# Patient Record
Sex: Female | Born: 1965 | Race: White | Hispanic: No | Marital: Married | Smoking: Never smoker
Health system: Southern US, Community
[De-identification: ages and names within clinical notes are randomized; demographics above are authoritative.]

## PROBLEM LIST (undated history)

## (undated) DIAGNOSIS — E559 Vitamin D deficiency, unspecified: Secondary | ICD-10-CM

## (undated) DIAGNOSIS — L309 Dermatitis, unspecified: Secondary | ICD-10-CM

## (undated) DIAGNOSIS — F32A Depression, unspecified: Secondary | ICD-10-CM

## (undated) DIAGNOSIS — K5090 Crohn's disease, unspecified, without complications: Secondary | ICD-10-CM

## (undated) HISTORY — DX: Vitamin D deficiency, unspecified: E55.9

## (undated) HISTORY — DX: Crohn's disease, unspecified, without complications: K50.90

## (undated) HISTORY — DX: Dermatitis, unspecified: L30.9

## (undated) HISTORY — PX: WISDOM TOOTH EXTRACTION: SHX21

## (undated) HISTORY — PX: COLONOSCOPY: SHX174

## (undated) HISTORY — DX: Depression, unspecified: F32.A

---

## 1999-07-03 ENCOUNTER — Other Ambulatory Visit: Admission: RE | Admit: 1999-07-03 | Discharge: 1999-07-03 | Payer: Self-pay | Admitting: *Deleted

## 2000-08-25 ENCOUNTER — Other Ambulatory Visit: Admission: RE | Admit: 2000-08-25 | Discharge: 2000-08-25 | Payer: Self-pay | Admitting: *Deleted

## 2001-08-26 ENCOUNTER — Other Ambulatory Visit: Admission: RE | Admit: 2001-08-26 | Discharge: 2001-08-26 | Payer: Self-pay | Admitting: Obstetrics and Gynecology

## 2002-08-28 ENCOUNTER — Other Ambulatory Visit: Admission: RE | Admit: 2002-08-28 | Discharge: 2002-08-28 | Payer: Self-pay | Admitting: Obstetrics and Gynecology

## 2003-09-26 ENCOUNTER — Other Ambulatory Visit: Admission: RE | Admit: 2003-09-26 | Discharge: 2003-09-26 | Payer: Self-pay | Admitting: Obstetrics and Gynecology

## 2003-10-09 ENCOUNTER — Ambulatory Visit (HOSPITAL_COMMUNITY): Admission: RE | Admit: 2003-10-09 | Discharge: 2003-10-09 | Payer: Self-pay | Admitting: Internal Medicine

## 2004-12-08 ENCOUNTER — Ambulatory Visit (HOSPITAL_COMMUNITY): Admission: RE | Admit: 2004-12-08 | Discharge: 2004-12-08 | Payer: Self-pay | Admitting: Internal Medicine

## 2005-03-22 ENCOUNTER — Inpatient Hospital Stay (HOSPITAL_COMMUNITY): Admission: AD | Admit: 2005-03-22 | Discharge: 2005-03-22 | Payer: Self-pay | Admitting: *Deleted

## 2005-03-22 ENCOUNTER — Encounter (INDEPENDENT_AMBULATORY_CARE_PROVIDER_SITE_OTHER): Payer: Self-pay | Admitting: *Deleted

## 2006-07-13 ENCOUNTER — Inpatient Hospital Stay (HOSPITAL_COMMUNITY): Admission: AD | Admit: 2006-07-13 | Discharge: 2006-07-16 | Payer: Self-pay | Admitting: Obstetrics and Gynecology

## 2007-02-10 ENCOUNTER — Encounter: Admission: RE | Admit: 2007-02-10 | Discharge: 2007-02-10 | Payer: Self-pay | Admitting: Gastroenterology

## 2008-11-27 ENCOUNTER — Ambulatory Visit (HOSPITAL_COMMUNITY): Admission: RE | Admit: 2008-11-27 | Discharge: 2008-11-27 | Payer: Self-pay | Admitting: Obstetrics

## 2008-12-02 ENCOUNTER — Inpatient Hospital Stay (HOSPITAL_COMMUNITY): Admission: AD | Admit: 2008-12-02 | Discharge: 2008-12-02 | Payer: Self-pay | Admitting: Obstetrics

## 2008-12-02 ENCOUNTER — Ambulatory Visit: Payer: Self-pay | Admitting: Obstetrics and Gynecology

## 2009-06-21 ENCOUNTER — Encounter: Admission: RE | Admit: 2009-06-21 | Discharge: 2009-06-21 | Payer: Self-pay | Admitting: Family Medicine

## 2010-10-26 ENCOUNTER — Inpatient Hospital Stay (HOSPITAL_COMMUNITY)
Admission: AD | Admit: 2010-10-26 | Discharge: 2010-10-28 | DRG: 372 | Disposition: A | Discharge status: Home / Self Care | Payer: BC Managed Care – PPO | Source: Ambulatory Visit | Attending: Obstetrics | Admitting: Obstetrics

## 2010-10-26 DIAGNOSIS — O4590 Premature separation of placenta, unspecified, unspecified trimester: Secondary | ICD-10-CM | POA: Diagnosis present

## 2010-10-26 DIAGNOSIS — O09529 Supervision of elderly multigravida, unspecified trimester: Secondary | ICD-10-CM | POA: Diagnosis present

## 2010-10-26 LAB — CBC
Hemoglobin: 12.9 g/dL (ref 12.0–15.0)
RBC: 4.07 MIL/uL (ref 3.87–5.11)
WBC: 11.5 10*3/uL — ABNORMAL HIGH (ref 4.0–10.5)

## 2010-10-27 ENCOUNTER — Other Ambulatory Visit: Payer: Self-pay | Admitting: Obstetrics & Gynecology

## 2010-10-27 LAB — RPR: RPR Ser Ql: NONREACTIVE

## 2010-10-28 LAB — CBC
HCT: 31.2 % — ABNORMAL LOW (ref 36.0–46.0)
Hemoglobin: 10.5 g/dL — ABNORMAL LOW (ref 12.0–15.0)
RBC: 3.34 MIL/uL — ABNORMAL LOW (ref 3.87–5.11)
WBC: 11 10*3/uL — ABNORMAL HIGH (ref 4.0–10.5)

## 2011-01-13 ENCOUNTER — Inpatient Hospital Stay (HOSPITAL_COMMUNITY): Admission: AD | Admit: 2011-01-13 | Payer: Self-pay | Source: Home / Self Care | Admitting: Obstetrics and Gynecology

## 2011-01-30 NOTE — H&P (Signed)
Jacqueline Banks, Jacqueline Banks             ACCOUNT NO.:  [id number]   MEDICAL RECORD NO.:  [id number]          PATIENT TYPE:  INP   LOCATION:  9170                          FACILITY:  [facility]   PHYSICIAN:  Richardean Sale, M.D.   DATE OF BIRTH:  [birth year]   DATE OF ADMISSION:  07/13/2006  DATE OF DISCHARGE:                                HISTORY & PHYSICAL   ADMISSION DIAGNOSIS:  A 39-week intrauterine pregnancy with nausea and  suspected HELLP syndrome.   HISTORY OF PRESENT ILLNESS:  This is a 40-year gravida 4, para 0-0-3-0 white  female who is at 39+ weeks gestation with a due date of July 17, 2006 by  first trimester ultrasound who presented to the office today complaining of  nausea.  She denies any headache or visual changes.  She reports good fetal  movement, complains of occasional contractions and denies the loss of fluid  or vaginal bleeding.  Upon her evaluation in the office, the patient had a  NST performed which showed a heart rate in the 160s.  The patient did have a  deceleration down to the 100s and was subsequently sent to maternity  admissions for evaluation.  Blood pressures have been normal throughout  pregnancy, and today given her persistent nausea, a preeclampsia laboratory  panel was drawn which revealed elevated liver function tests with her ALT  and AST in the 200s and elevated LDH of 284, platelet count of 133, all  consistent with HELLP syndrome.  The patient's blood pressure in maternity  admissions was only 100/60.  She is being admitted for induction of labor.   PAST MEDICAL HISTORY:  No prior hospitalizations.   PAST SURGICAL HISTORY:  None.   OBSTETRICAL HISTORY:  Spontaneous AB x3.  Advanced maternal age.   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   SOCIAL HISTORY:  Denies alcohol, tobacco or drugs since learning she was  pregnant.   FAMILY HISTORY:  Sister had TB.  Mother had melanoma.  Both paternal and  maternal  grandmothers had breast cancer.  Mother has hypertension.  No birth  defects, congenital anomalies, Down syndrome, spina bifida or cystic  fibrosis.   MEDICATIONS:  Prenatal vitamins and Valtrex.   ALLERGIES:  No known drug allergies.   PRENATAL LABS:  A 20-week hemoglobin 11.4, platelet count 216.  Group B beta  strep negative performed on June 18, 2006.  Blood type O+, antibody screen  negative, RPR nonreactive, rubella immune, hepatitis B surface antigen  nonreactive, HIV nonreactive.  A 16-week alpha-fetoprotein normal.  Ultrasound within normal limits.  Anatomy ultrasound showed choroid plexus  cyst but otherwise a normal anatomic survey.   PHYSICAL EXAM:  She is afebrile.  Vital signs are stable with blood pressure  in the 100s/60s.  GENERAL:  She is a well-developed, well-nourished white female who appears  in no acute distress.  HEART:  Regular rate and rhythm.  LUNGS:  Clear to auscultation bilaterally.  ABDOMEN:  Gravid, soft, nontender.  There is no epigastric tenderness on  exam.  EXTREMITIES:  Only trace edema in the feet bilaterally.  No periorbital  edema.  No facial  edema.  Deep tendon reflexes are 3+ bilaterally with no  clonus.  NEUROLOGICAL:  Exam was nonfocal.  CERVIX:  Closed, 50% effaced, -1 station per examination in the office  earlier today.   PREECLAMPSIA LAB:  Platelet count is 133, LDH 284, ALT in the 100s, AST in  the 200s, uric acid 5.7.  Urine protein reported as trace in the office  earlier today.   ASSESSMENT:  A 40-year gravida 4, para 0-0-3-0 white female at 39+ weeks  gestation with nausea.   LABORATORY STUDIES:  Consistent with hellp (hemolysis, elevated liver  enzymes, and low platelet count) syndrome.  No hypertension.   PLAN:  1. Clinical picture is consistent with HELLP syndrome/preeclampsia.  The      patient's glucose on admission was in the 80s, so I do not believe this      is acute fatty liver of pregnancy.  We will start  magnesium sulfate for      seizure prophylaxis and repeat laboratory studies in 4 hours.  We will      start with Cervidil for cervical ripening but if laboratory studies are      worsening we will proceed with cesarean delivery given the patient's      cervical exam.  2. Continuousfetal monitoring.  3. Group B strep is negative.  Antibiotic prophylaxis not needed.  4. Check PT, PTT and fibrinogen and type and screen on admission.      Richardean Sale, M.D.  Electronically Signed     JW/MEDQ  D:  07/13/2006  T:  07/13/2006  Job:  [id number]

## 2011-06-11 ENCOUNTER — Ambulatory Visit
Admission: RE | Admit: 2011-06-11 | Discharge: 2011-06-11 | Disposition: A | Discharge status: Home / Self Care | Payer: BC Managed Care – PPO | Source: Ambulatory Visit | Attending: Family Medicine | Admitting: Family Medicine

## 2011-06-11 ENCOUNTER — Other Ambulatory Visit: Payer: Self-pay | Admitting: Family Medicine

## 2013-10-06 ENCOUNTER — Ambulatory Visit (INDEPENDENT_AMBULATORY_CARE_PROVIDER_SITE_OTHER): Payer: Self-pay | Admitting: Surgery

## 2013-10-11 ENCOUNTER — Encounter (INDEPENDENT_AMBULATORY_CARE_PROVIDER_SITE_OTHER): Payer: Self-pay | Admitting: Surgery

## 2013-11-22 ENCOUNTER — Encounter: Payer: Self-pay | Admitting: Internal Medicine

## 2013-11-22 DIAGNOSIS — Z0000 Encounter for general adult medical examination without abnormal findings: Secondary | ICD-10-CM

## 2013-11-22 DIAGNOSIS — E559 Vitamin D deficiency, unspecified: Secondary | ICD-10-CM | POA: Insufficient documentation

## 2013-11-22 NOTE — Progress Notes (Signed)
Patient ID: Jacqueline Banks, female   DOB: 1966/07/28, 48 y.o.   MRN: [id number] Error

## 2014-03-13 ENCOUNTER — Ambulatory Visit (INDEPENDENT_AMBULATORY_CARE_PROVIDER_SITE_OTHER): Payer: 59 | Admitting: Internal Medicine

## 2014-03-13 ENCOUNTER — Encounter: Payer: Self-pay | Admitting: Internal Medicine

## 2014-03-13 VITALS — BP 106/76 | HR 76 | Temp 98.1°F | Resp 16 | Ht 68.5 in | Wt 156.4 lb

## 2014-03-13 DIAGNOSIS — E559 Vitamin D deficiency, unspecified: Secondary | ICD-10-CM

## 2014-03-13 DIAGNOSIS — R7401 Elevation of levels of liver transaminase levels: Secondary | ICD-10-CM

## 2014-03-13 DIAGNOSIS — N6019 Diffuse cystic mastopathy of unspecified breast: Secondary | ICD-10-CM

## 2014-03-13 DIAGNOSIS — M2550 Pain in unspecified joint: Secondary | ICD-10-CM

## 2014-03-13 DIAGNOSIS — Z113 Encounter for screening for infections with a predominantly sexual mode of transmission: Secondary | ICD-10-CM

## 2014-03-13 DIAGNOSIS — R740 Nonspecific elevation of levels of transaminase and lactic acid dehydrogenase [LDH]: Secondary | ICD-10-CM

## 2014-03-13 DIAGNOSIS — Z0000 Encounter for general adult medical examination without abnormal findings: Secondary | ICD-10-CM

## 2014-03-13 DIAGNOSIS — Z1212 Encounter for screening for malignant neoplasm of rectum: Secondary | ICD-10-CM

## 2014-03-13 LAB — SEDIMENTATION RATE: Sed Rate: 5 mm/hr (ref 0–22)

## 2014-03-13 LAB — CBC WITH DIFFERENTIAL/PLATELET
BASOS PCT: 1 % (ref 0–1)
Basophils Absolute: 0.1 10*3/uL (ref 0.0–0.1)
EOS ABS: 0.9 10*3/uL — AB (ref 0.0–0.7)
Eosinophils Relative: 12 % — ABNORMAL HIGH (ref 0–5)
HCT: 38.7 % (ref 36.0–46.0)
Hemoglobin: 13.5 g/dL (ref 12.0–15.0)
Lymphocytes Relative: 26 % (ref 12–46)
Lymphs Abs: 1.9 10*3/uL (ref 0.7–4.0)
MCH: 30.5 pg (ref 26.0–34.0)
MCHC: 34.9 g/dL (ref 30.0–36.0)
MCV: 87.4 fL (ref 78.0–100.0)
MONO ABS: 0.6 10*3/uL (ref 0.1–1.0)
Monocytes Relative: 8 % (ref 3–12)
NEUTROS ABS: 3.9 10*3/uL (ref 1.7–7.7)
NEUTROS PCT: 53 % (ref 43–77)
Platelets: 243 10*3/uL (ref 150–400)
RBC: 4.43 MIL/uL (ref 3.87–5.11)
RDW: 13.2 % (ref 11.5–15.5)
WBC: 7.3 10*3/uL (ref 4.0–10.5)

## 2014-03-13 LAB — HEMOGLOBIN A1C
HEMOGLOBIN A1C: 5.4 % (ref <5.7)
MEAN PLASMA GLUCOSE: 108 mg/dL (ref <117)

## 2014-03-13 NOTE — Patient Instructions (Addendum)
Recommend the book "the END of DIETING" by Dr Baker Janus   and the  Book "The END of DIABETES " by Dr Excell Seltzer  At [email or web address] - get book & Audio CD's      Being diabetic has a  300% increased risk for heart attack, stroke, cancer, and alzheimer- type vascular dementia. It is very important that you work harder with diet by avoiding all foods that are white except chicken & fish. Avoid white rice (brown & wild rice is OK), white potatoes (sweetpotatoes in moderation is OK), White bread or wheat bread or anything made out of white flour like bagels, donuts, rolls, buns, biscuits, cakes, pastries, cookies, pizza crust, and pasta (made from white flour & egg whites) - vegetarian pasta or spinach or wheat pasta is OK. Multigrain breads like Arnold's or [employer], or multigrain sandwich thins or flatbreads.  Diet, exercise and weight loss can reverse and cure diabetes in the early stages.  Diet, exercise and weight loss is very important in the control and prevention of complications of diabetes which affects every system in your body, ie. Brain - dementia/stroke, eyes - glaucoma/blindness, heart - heart attack/heart failure, kidneys - dialysis, stomach - gastric paralysis, intestines - malabsorption, nerves - severe painful neuritis, circulation - gangrene & loss of a leg(s), and finally cancer and Alzheimers.    I recommend avoid fried & greasy foods,  sweets/candy, white rice (brown or wild rice or Quinoa is OK), white potatoes (sweet potatoes are OK) - anything made from white flour - bagels, doughnuts, rolls, buns, biscuits,white and wheat breads, pizza crust and traditional pasta made of white flour & egg white(vegetarian pasta or spinach or wheat pasta is OK).  Multi-grain bread is OK - like multi-grain flat bread or sandwich thins. Avoid alcohol in excess. Exercise is also important.    Eat all the vegetables you want - avoid meat, especially red meat and dairy - especially cheese.  Cheese is  the most concentrated form of trans-fats which is the worst thing to clog up our arteries. Veggie cheese is OK which can be found in the fresh produce section at [employer] or [employer] or [employer] ======================================================================================================================================= Preventative Care for Adults - Female      MAINTAIN REGULAR HEALTH EXAMS:  A routine yearly physical is a good way to check in with your primary care Jacqueline Banks about your health and preventive screening. It is also an opportunity to share updates about your health and any concerns you have, and receive a thorough all-over exam.   Most health insurance companies pay for at least some preventative services.  Check with your health plan for specific coverages.  WHAT PREVENTATIVE SERVICES DO WOMEN NEED?  Adult women should have their weight and blood pressure checked regularly.   Women age 89 and older should have their cholesterol levels checked regularly.  Women should be screened for cervical cancer with a Pap smear and pelvic exam beginning at either age 57, or 3 years after they become sexually activity.    Breast cancer screening generally begins at age 52 with a mammogram and breast exam by your primary care Jacqueline Banks.    Beginning at age 61 and continuing to age 49, women should be screened for colorectal cancer.  Certain people may need continued testing until age 46.  Updating vaccinations is part of preventative care.  Vaccinations help protect against diseases such as the flu.  Osteoporosis is a disease in which the bones lose minerals and strength  as we age. Women ages 10 and over should discuss this with their caregivers, as should women after menopause who have other risk factors.  Lab tests are generally done as part of preventative care to screen for anemia and blood disorders, to screen for problems with the kidneys and liver, to screen for  bladder problems, to check blood sugar, and to check your cholesterol level.  Preventative services generally include counseling about diet, exercise, avoiding tobacco, drugs, excessive alcohol consumption, and sexually transmitted infections.    GENERAL RECOMMENDATIONS FOR GOOD HEALTH:  Healthy diet:  Eat a variety of foods, including fruit, vegetables, animal or vegetable protein, such as meat, fish, chicken, and eggs, or beans, lentils, tofu, and grains, such as rice.  Drink plenty of water daily.  Decrease saturated fat in the diet, avoid lots of red meat, processed foods, sweets, fast foods, and fried foods.  Exercise:  Aerobic exercise helps maintain good heart health. At least 30-40 minutes of moderate-intensity exercise is recommended. For example, a brisk walk that increases your heart rate and breathing. This should be done on most days of the week.   Find a type of exercise or a variety of exercises that you enjoy so that it becomes a part of your daily life.  Examples are running, walking, swimming, water aerobics, and biking.  For motivation and support, explore group exercise such as aerobic class, spin class, Zumba, Yoga,or  martial arts, etc.    Set exercise goals for yourself, such as a certain weight goal, walk or run in a race such as a 5k walk/run.  Speak to your primary care Jacqueline Banks about exercise goals.  Disease prevention:  If you smoke or chew tobacco, find out from your caregiver how to quit. It can literally save your life, no matter how long you have been a tobacco user. If you do not use tobacco, never begin.   Maintain a healthy diet and normal weight. Increased weight leads to problems with blood pressure and diabetes.   The Body Mass Index or BMI is a way of measuring how much of your body is fat. Having a BMI above 27 increases the risk of heart disease, diabetes, hypertension, stroke and other problems related to obesity. Your caregiver can help determine  your BMI and based on it develop an exercise and dietary program to help you achieve or maintain this important measurement at a healthful level.  High blood pressure causes heart and blood vessel problems.  Persistent high blood pressure should be treated with medicine if weight loss and exercise do not work.   Fat and cholesterol leaves deposits in your arteries that can block them. This causes heart disease and vessel disease elsewhere in your body.  If your cholesterol is found to be high, or if you have heart disease or certain other medical conditions, then you may need to have your cholesterol monitored frequently and be treated with medication.   Ask if you should have a cardiac stress test if your history suggests this. A stress test is a test done on a treadmill that looks for heart disease. This test can find disease prior to there being a problem.  Menopause can be associated with physical symptoms and risks. Hormone replacement therapy is available to decrease these. You should talk to your caregiver about whether starting or continuing to take hormones is right for you.   Osteoporosis is a disease in which the bones lose minerals and strength as we age. This can  result in serious bone fractures. Risk of osteoporosis can be identified using a bone density scan. Women ages 70 and over should discuss this with their caregivers, as should women after menopause who have other risk factors. Ask your caregiver whether you should be taking a calcium supplement and Vitamin D, to reduce the rate of osteoporosis.   Avoid drinking alcohol in excess (more than two drinks per day).  Avoid use of street drugs. Do not share needles with anyone. Ask for professional help if you need assistance or instructions on stopping the use of alcohol, cigarettes, and/or drugs.  Brush your teeth twice a day with fluoride toothpaste, and floss once a day. Good oral hygiene prevents tooth decay and gum disease. The  problems can be painful, unattractive, and can cause other health problems. Visit your dentist for a routine oral and dental check up and preventive care every 6-12 months.   Look at your skin regularly.  Use a mirror to look at your back. Notify your caregivers of changes in moles, especially if there are changes in shapes, colors, a size larger than a pencil eraser, an irregular border, or development of new moles.  Safety:  Use seatbelts 100% of the time, whether driving or as a passenger.  Use safety devices such as hearing protection if you work in environments with loud noise or significant background noise.  Use safety glasses when doing any work that could send debris in to the eyes.  Use a helmet if you ride a bike or motorcycle.  Use appropriate safety gear for contact sports.  Talk to your caregiver about gun safety.  Use sunscreen with a SPF (or skin protection factor) of 15 or greater.  Lighter skinned people are at a greater risk of skin cancer. Don't forget to also wear sunglasses in order to protect your eyes from too much damaging sunlight. Damaging sunlight can accelerate cataract formation.   Practice safe sex. Use condoms. Condoms are used for birth control and to help reduce the spread of sexually transmitted infections (or STIs).  Some of the STIs are gonorrhea (the clap), chlamydia, syphilis, trichomonas, herpes, HPV (human papilloma virus) and HIV (human immunodeficiency virus) which causes AIDS. The herpes, HIV and HPV are viral illnesses that have no cure. These can result in disability, cancer and death.   Keep carbon monoxide and smoke detectors in your home functioning at all times. Change the batteries every 6 months or use a model that plugs into the wall.   Vaccinations:  Stay up to date with your tetanus shots and other required immunizations. You should have a booster for tetanus every 10 years. Be sure to get your flu shot every year, since 5%-20% of the [place]  population comes down with the flu. The flu vaccine changes each year, so being vaccinated once is not enough. Get your shot in the fall, before the flu season peaks.   Other vaccines to consider:  Human Papilloma Virus or HPV causes cancer of the cervix, and other infections that can be transmitted from person to person. There is a vaccine for HPV, and females should get immunized between the ages of 31 and 12. It requires a series of 3 shots.   Pneumococcal vaccine to protect against certain types of pneumonia.  This is normally recommended for adults age 71 or older.  However, adults younger than 48 years old with certain underlying conditions such as diabetes, heart or lung disease should also receive the vaccine.  Shingles  vaccine to protect against Varicella Zoster if you are older than age 3, or younger than 48 years old with certain underlying illness.  Hepatitis A vaccine to protect against a form of infection of the liver by a virus acquired from food.  Hepatitis B vaccine to protect against a form of infection of the liver by a virus acquired from blood or body fluids, particularly if you work in health care.  If you plan to travel internationally, check with your local health department for specific vaccination recommendations.  Cancer Screening:  Breast cancer screening is essential to preventive care for women. All women age 32 and older should perform a breast self-exam every month. At age 52 and older, women should have their caregiver complete a breast exam each year. Women at ages 47 and older should have a mammogram (x-ray film) of the breasts. Your caregiver can discuss how often you need mammograms.    Cervical cancer screening includes taking a Pap smear (sample of cells examined under a microscope) from the cervix (end of the uterus). It also includes testing for HPV (Human Papilloma Virus, which can cause cervical cancer). Screening and a pelvic exam should begin at age  2, or 3 years after a woman becomes sexually active. Screening should occur every year, with a Pap smear but no HPV testing, up to age 63. After age 45, you should have a Pap smear every 3 years with HPV testing, if no HPV was found previously.   Most routine colon cancer screening begins at the age of 33. On a yearly basis, doctors may provide special easy to use take-home tests to check for hidden blood in the stool. Sigmoidoscopy or colonoscopy can detect the earliest forms of colon cancer and is life saving. These tests use a small camera at the end of a tube to directly examine the colon. Speak to your caregiver about this at age 28, when routine screening begins (and is repeated every 5 years unless early forms of pre-cancerous polyps or small growths are found).

## 2014-03-13 NOTE — Progress Notes (Signed)
Patient ID: Jacqueline Banks, female   DOB: 1966/06/09, 48 y.o.   MRN: [id number]   Annual Screening Comprehensive Examination   This very nice 48 y.o.MWF presents for complete physical.  Patient has no major health issues.    Patient's only complaint concern relates as it did last year to recurrent eczematoid type rash of her palms. In 2006 she was Dx'd with Crohn's Dz by Bx but never took treatment  As she never had an GI Sx's as abd. Cramping / pain, diarrhea, fever or weight loss. She did has 1 episode of iritis about that time which wa treated and never since returned.   Finally, patient has history of Vitamin D Deficiency and last vitamin D was 68 in Apr 2014 and patient never started recommended Vit D.   Medication Sig  . loratadine (CLARITIN) 10 MG tablet Take 10 mg by mouth daily.   No Known Allergies  History reviewed. No pertinent past medical history.  History reviewed. No pertinent past surgical history.  History   Social History  . Marital Status: Married    Spouse Name: N/A    Number of Children: N/A  . Years of Education: N/A   Occupational History  . Not on file.   Social History Main Topics  . Smoking status: Never Smoker   . Smokeless tobacco: Not on file  . Alcohol Use: Yes     Comment: wine 3-4 glasses per week  . Drug Use: Not on file  . Sexual Activity: Not on file    ROS Constitutional: Denies fever, chills, weight loss/gain, headaches, insomnia, fatigue, night sweats, and change in appetite. Eyes: Denies redness, blurred vision, diplopia, discharge, itchy, watery eyes.  ENT: Denies discharge, congestion, post nasal drip, epistaxis, sore throat, earache, hearing loss, dental pain, Tinnitus, Vertigo, Sinus pain, snoring.  Cardio: Denies chest pain, palpitations, irregular heartbeat, syncope, dyspnea, diaphoresis, orthopnea, PND, claudication, edema Respiratory: denies cough, dyspnea, DOE, pleurisy, hoarseness, laryngitis, wheezing.  Gastrointestinal:  Denies dysphagia, heartburn, reflux, water brash, pain, cramps, nausea, vomiting, bloating, diarrhea, constipation, hematemesis, melena, hematochezia, jaundice, hemorrhoids Genitourinary: Denies dysuria, frequency, urgency, nocturia, hesitancy, discharge, hematuria, flank pain Breast: Breast lumps, nipple discharge, bleeding.  Musculoskeletal: Denies arthralgia, myalgia, stiffness, Jt. Swelling, pain, limp, and strain/sprain. Skin: Denies puritis, rash, hives, warts, acne, eczema, changing in skin lesion Neuro: Weakness, tremor, incoordination, spasms, paresthesia, pain Psychiatric: Denies confusion, memory loss, sensory loss. Endocrine: Denies change in weight, skin, hair change, nocturia, and paresthesia, diabetic polys, visual blurring, hyper /hypo glycemic episodes.  Heme/Lymph: No excessive bleeding, bruising, enlarged lymph nodes.  Physical Exam  BP 106/76  P 76  T 98.1 F (36.7 C)  R 16  Ht 5' 8.5"   Wt 156 lb 6.4 oz   BMI 23.43 kg/m2  General Appearance: Well nourished and in no apparent distress. Eyes: PERRLA, EOMs, conjunctiva no swelling or erythema, normal fundi and vessels. Sinuses: No frontal/maxillary tenderness ENT/Mouth: EACs patent / TMs  nl. Nares clear without erythema, swelling, mucoid exudates. Oral hygiene is good. No erythema, swelling, or exudate. Tongue normal, non-obstructing. Tonsils not swollen or erythematous. Hearing normal.  Neck: Supple, thyroid normal. No bruits, nodes or JVD. Respiratory: Respiratory effort normal.  BS equal and clear bilateral without rales, rhonci, wheezing or stridor. Cardio: Heart sounds are normal with regular rate and rhythm and no murmurs, rubs or gallops. Peripheral pulses are normal and equal bilaterally without edema. No aortic or femoral bruits. Chest: symmetric with normal excursions and percussion. Breasts: Deferred to GYN.  Abdomen: Flat, soft, with bowl sounds. Nontender, no guarding, rebound, hernias, masses, or  organomegaly.  Lymphatics: Non tender without lymphadenopathy.  Genitourinary:  Musculoskeletal: Full ROM all peripheral extremities, joint stability, 5/5 strength, and normal gait. Skin: Warm and dry without rashes, lesions, cyanosis, clubbing or  ecchymosis.  Neuro: Cranial nerves intact, reflexes equal bilaterally. Normal muscle tone, no cerebellar symptoms. Sensation intact.  Pysch: Awake and oriented X 3, normal affect, Insight and Judgment appropriate.   Assessment and Plan  1. Annual Screening Examination 2. Vitamin D Deficiency 3.  Eczema   Continue prudent diet as discussed, weight control, regular exercise, and medications. Routine screening labs and tests as requested with regular follow-up as recommended.

## 2014-03-14 LAB — URINALYSIS, MICROSCOPIC ONLY
BACTERIA UA: NONE SEEN
Casts: NONE SEEN
Crystals: NONE SEEN

## 2014-03-14 LAB — LIPID PANEL
CHOLESTEROL: 174 mg/dL (ref 0–200)
HDL: 86 mg/dL (ref >39)
LDL Cholesterol: 77 mg/dL (ref 0–99)
TRIGLYCERIDES: 56 mg/dL (ref <150)
Total CHOL/HDL Ratio: 2 Ratio
VLDL: 11 mg/dL (ref 0–40)

## 2014-03-14 LAB — HEPATIC FUNCTION PANEL
ALBUMIN: 4.6 g/dL (ref 3.5–5.2)
ALT: 10 U/L (ref 0–35)
AST: 16 U/L (ref 0–37)
Alkaline Phosphatase: 61 U/L (ref 39–117)
BILIRUBIN INDIRECT: 0.5 mg/dL (ref 0.2–1.2)
Bilirubin, Direct: 0.2 mg/dL (ref 0.0–0.3)
TOTAL PROTEIN: 7.1 g/dL (ref 6.0–8.3)
Total Bilirubin: 0.7 mg/dL (ref 0.2–1.2)

## 2014-03-14 LAB — IRON AND TIBC
%SAT: 43 % (ref 20–55)
Iron: 176 ug/dL — ABNORMAL HIGH (ref 42–145)
TIBC: 407 ug/dL (ref 250–470)
UIBC: 231 ug/dL (ref 125–400)

## 2014-03-14 LAB — BASIC METABOLIC PANEL WITH GFR
BUN: 15 mg/dL (ref 6–23)
CALCIUM: 9.5 mg/dL (ref 8.4–10.5)
CO2: 27 mEq/L (ref 19–32)
Chloride: 99 mEq/L (ref 96–112)
Creat: 0.64 mg/dL (ref 0.50–1.10)
GLUCOSE: 79 mg/dL (ref 70–99)
Potassium: 4.7 mEq/L (ref 3.5–5.3)
SODIUM: 135 meq/L (ref 135–145)

## 2014-03-14 LAB — HIV ANTIBODY (ROUTINE TESTING W REFLEX): HIV 1&2 Ab, 4th Generation: NONREACTIVE

## 2014-03-14 LAB — INSULIN, FASTING: INSULIN FASTING, SERUM: 10 u[IU]/mL (ref 3–28)

## 2014-03-14 LAB — VITAMIN B12: Vitamin B-12: 467 pg/mL (ref 211–911)

## 2014-03-14 LAB — C3 AND C4
C3 Complement: 103 mg/dL (ref 90–180)
C4 Complement: 25 mg/dL (ref 10–40)

## 2014-03-14 LAB — RHEUMATOID FACTOR: Rhuematoid fact SerPl-aCnc: <10 IU/mL (ref <=14)

## 2014-03-14 LAB — RPR

## 2014-03-14 LAB — VITAMIN D 25 HYDROXY (VIT D DEFICIENCY, FRACTURES): VIT D 25 HYDROXY: 51 ng/mL (ref 30–89)

## 2014-03-14 LAB — TSH: TSH: 2.593 u[IU]/mL (ref 0.350–4.500)

## 2014-03-14 LAB — HEPATITIS A ANTIBODY, TOTAL: Hep A Total Ab: NONREACTIVE

## 2014-03-14 LAB — MAGNESIUM: MAGNESIUM: 2 mg/dL (ref 1.5–2.5)

## 2014-03-14 LAB — CYCLIC CITRUL PEPTIDE ANTIBODY, IGG: Cyclic Citrullin Peptide Ab: 12.7 U/mL — ABNORMAL HIGH (ref 0.0–5.0)

## 2014-03-14 LAB — MICROALBUMIN / CREATININE URINE RATIO
Creatinine, Urine: 101.7 mg/dL
Microalb Creat Ratio: 4.9 mg/g (ref 0.0–30.0)
Microalb, Ur: 0.5 mg/dL (ref 0.00–1.89)

## 2014-03-14 LAB — ANA: Anti Nuclear Antibody(ANA): NEGATIVE

## 2014-03-14 LAB — HEPATITIS B CORE ANTIBODY, TOTAL: Hep B Core Total Ab: NONREACTIVE

## 2014-03-14 LAB — HEPATITIS B SURFACE ANTIBODY,QUALITATIVE: Hep B S Ab: NEGATIVE

## 2014-03-14 LAB — HEPATITIS C ANTIBODY: HCV Ab: NEGATIVE

## 2014-03-14 LAB — ANTI-DNA ANTIBODY, DOUBLE-STRANDED: DS DNA AB: 3 [IU]/mL

## 2014-03-15 LAB — HEPATITIS B E ANTIBODY: Hepatitis Be Antibody: NONREACTIVE

## 2014-03-19 ENCOUNTER — Telehealth: Payer: Self-pay | Admitting: *Deleted

## 2014-03-19 NOTE — Telephone Encounter (Signed)
Patient had questions regarding her rash.  Patient asked if Claritin could be causing her rash.  Per Dr Melford Aase, Claritin should not cause her rash and he suggest patient see a dermatologist.  Patient sees Dr Crista Luria and she will schedule an appointment.

## 2014-03-28 ENCOUNTER — Other Ambulatory Visit: Payer: Self-pay | Admitting: Internal Medicine

## 2014-03-28 ENCOUNTER — Ambulatory Visit (HOSPITAL_COMMUNITY)
Admission: RE | Admit: 2014-03-28 | Discharge: 2014-03-28 | Disposition: A | Discharge status: Home / Self Care | Payer: 59 | Source: Ambulatory Visit | Attending: Internal Medicine | Admitting: Internal Medicine

## 2014-03-28 DIAGNOSIS — N6019 Diffuse cystic mastopathy of unspecified breast: Secondary | ICD-10-CM

## 2014-03-28 DIAGNOSIS — Z1231 Encounter for screening mammogram for malignant neoplasm of breast: Secondary | ICD-10-CM

## 2014-11-23 ENCOUNTER — Encounter: Payer: Self-pay | Admitting: Internal Medicine

## 2015-02-25 ENCOUNTER — Other Ambulatory Visit (HOSPITAL_COMMUNITY): Payer: Self-pay | Admitting: Obstetrics & Gynecology

## 2015-02-25 DIAGNOSIS — Z1231 Encounter for screening mammogram for malignant neoplasm of breast: Secondary | ICD-10-CM

## 2015-03-21 ENCOUNTER — Ambulatory Visit (INDEPENDENT_AMBULATORY_CARE_PROVIDER_SITE_OTHER): Payer: 59 | Admitting: Internal Medicine

## 2015-03-21 ENCOUNTER — Encounter: Payer: Self-pay | Admitting: Internal Medicine

## 2015-03-21 VITALS — BP 118/72 | HR 72 | Temp 97.3°F | Resp 16 | Ht 68.5 in | Wt 153.8 lb

## 2015-03-21 DIAGNOSIS — R7309 Other abnormal glucose: Secondary | ICD-10-CM

## 2015-03-21 DIAGNOSIS — E559 Vitamin D deficiency, unspecified: Secondary | ICD-10-CM

## 2015-03-21 DIAGNOSIS — R5383 Other fatigue: Secondary | ICD-10-CM

## 2015-03-21 DIAGNOSIS — Z6823 Body mass index (BMI) 23.0-23.9, adult: Secondary | ICD-10-CM

## 2015-03-21 DIAGNOSIS — R030 Elevated blood-pressure reading, without diagnosis of hypertension: Secondary | ICD-10-CM

## 2015-03-21 DIAGNOSIS — K429 Umbilical hernia without obstruction or gangrene: Secondary | ICD-10-CM

## 2015-03-21 DIAGNOSIS — E7800 Pure hypercholesterolemia, unspecified: Secondary | ICD-10-CM

## 2015-03-21 DIAGNOSIS — IMO0001 Reserved for inherently not codable concepts without codable children: Secondary | ICD-10-CM

## 2015-03-21 DIAGNOSIS — N3090 Cystitis, unspecified without hematuria: Secondary | ICD-10-CM

## 2015-03-21 DIAGNOSIS — Z1212 Encounter for screening for malignant neoplasm of rectum: Secondary | ICD-10-CM

## 2015-03-21 DIAGNOSIS — Z79899 Other long term (current) drug therapy: Secondary | ICD-10-CM

## 2015-03-21 LAB — BASIC METABOLIC PANEL WITH GFR
BUN: 14 mg/dL (ref 6–23)
CALCIUM: 9.2 mg/dL (ref 8.4–10.5)
CO2: 27 mEq/L (ref 19–32)
Chloride: 102 mEq/L (ref 96–112)
Creat: 0.65 mg/dL (ref 0.50–1.10)
GFR, Est Non African American: >89 mL/min
Glucose, Bld: 60 mg/dL — ABNORMAL LOW (ref 70–99)
POTASSIUM: 4.3 meq/L (ref 3.5–5.3)
Sodium: 141 mEq/L (ref 135–145)

## 2015-03-21 LAB — TSH: TSH: 2.622 u[IU]/mL (ref 0.350–4.500)

## 2015-03-21 LAB — HEPATIC FUNCTION PANEL
ALT: 10 U/L (ref 0–35)
AST: 18 U/L (ref 0–37)
Albumin: 4.2 g/dL (ref 3.5–5.2)
Alkaline Phosphatase: 58 U/L (ref 39–117)
BILIRUBIN DIRECT: 0.1 mg/dL (ref 0.0–0.3)
BILIRUBIN TOTAL: 0.5 mg/dL (ref 0.2–1.2)
Indirect Bilirubin: 0.4 mg/dL (ref 0.2–1.2)
Total Protein: 7.1 g/dL (ref 6.0–8.3)

## 2015-03-21 LAB — HEMOGLOBIN A1C
HEMOGLOBIN A1C: 5.4 % (ref <5.7)
MEAN PLASMA GLUCOSE: 108 mg/dL (ref <117)

## 2015-03-21 LAB — IRON AND TIBC
%SAT: 35 % (ref 20–55)
IRON: 149 ug/dL — AB (ref 42–145)
TIBC: 426 ug/dL (ref 250–470)
UIBC: 277 ug/dL (ref 125–400)

## 2015-03-21 LAB — CBC WITH DIFFERENTIAL/PLATELET
Basophils Absolute: 0.1 10*3/uL (ref 0.0–0.1)
Basophils Relative: 1 % (ref 0–1)
EOS PCT: 10 % — AB (ref 0–5)
Eosinophils Absolute: 0.8 10*3/uL — ABNORMAL HIGH (ref 0.0–0.7)
HCT: 40.4 % (ref 36.0–46.0)
HEMOGLOBIN: 13.4 g/dL (ref 12.0–15.0)
LYMPHS ABS: 1.7 10*3/uL (ref 0.7–4.0)
LYMPHS PCT: 22 % (ref 12–46)
MCH: 29.6 pg (ref 26.0–34.0)
MCHC: 33.2 g/dL (ref 30.0–36.0)
MCV: 89.2 fL (ref 78.0–100.0)
MPV: 10.1 fL (ref 8.6–12.4)
Monocytes Absolute: 0.6 10*3/uL (ref 0.1–1.0)
Monocytes Relative: 8 % (ref 3–12)
Neutro Abs: 4.4 10*3/uL (ref 1.7–7.7)
Neutrophils Relative %: 59 % (ref 43–77)
Platelets: 264 10*3/uL (ref 150–400)
RBC: 4.53 MIL/uL (ref 3.87–5.11)
RDW: 13.2 % (ref 11.5–15.5)
WBC: 7.5 10*3/uL (ref 4.0–10.5)

## 2015-03-21 LAB — LIPID PANEL
CHOL/HDL RATIO: 1.9 ratio
Cholesterol: 181 mg/dL (ref 0–200)
HDL: 94 mg/dL (ref >=46)
LDL CALC: 79 mg/dL (ref 0–99)
Triglycerides: 42 mg/dL (ref <150)
VLDL: 8 mg/dL (ref 0–40)

## 2015-03-21 LAB — MAGNESIUM: Magnesium: 1.9 mg/dL (ref 1.5–2.5)

## 2015-03-21 LAB — VITAMIN B12: VITAMIN B 12: 456 pg/mL (ref 211–911)

## 2015-03-21 MED ORDER — CIPROFLOXACIN HCL 250 MG PO TABS
ORAL_TABLET | ORAL | Status: DC
Start: 2015-03-21 — End: 2016-04-06

## 2015-03-21 NOTE — Patient Instructions (Signed)
Recommend Adult Low dose Aspirin or   coated  Aspirin 81 mg daily   To reduce risk of Colon Cancer 20 %,   Skin Cancer 26 % ,   Melanoma 46%   and   Pancreatic cancer 60%  ++++++++++++++++++  Vitamin D goal   is between 70-100.   Please make sure that you are taking your Vitamin D as directed.   It is very important as a natural anti-inflammatory   helping hair, skin, and nails, as well as reducing stroke and heart attack risk.   It helps your bones and helps with mood.  It also decreases numerous cancer risks so please take it as directed.   Low Vit D is associated with a 200-300% higher risk for CANCER   and 200-300% higher risk for HEART   ATTACK  &  STROKE.   ......................................  It is also associated with higher death rate at younger ages,   autoimmune diseases like Rheumatoid arthritis, Lupus, Multiple Sclerosis.     Also many other serious conditions, like depression, Alzheimer's  Dementia, infertility, muscle aches, fatigue, fibromyalgia - just to name a few.  +++++++++++++++++++  Recommend the book "The END of DIETING" by Dr Joel Fuhrman   & the book "The END of DIABETES " by Dr Joel Fuhrman  At [employer] - get book & Audio CD's     Being diabetic has a  300% increased risk for heart attack, stroke, cancer, and alzheimer- type vascular dementia. It is very important that you work harder with diet by avoiding all foods that are white. Avoid white rice (brown & wild rice is OK), white potatoes (sweetpotatoes in moderation is OK), White bread or wheat bread or anything made out of white flour like bagels, donuts, rolls, buns, biscuits, cakes, pastries, cookies, pizza crust, and pasta (made from white flour & egg whites) - vegetarian pasta or spinach or wheat pasta is OK. Multigrain breads like Arnold's or [employer], or multigrain sandwich thins or flatbreads.  Diet, exercise and weight loss can reverse and cure diabetes in the early  stages.  Diet, exercise and weight loss is very important in the control and prevention of complications of diabetes which affects every system in your body, ie. Brain - dementia/stroke, eyes - glaucoma/blindness, heart - heart attack/heart failure, kidneys - dialysis, stomach - gastric paralysis, intestines - malabsorption, nerves - severe painful neuritis, circulation - gangrene & loss of a leg(s), and finally cancer and Alzheimers.    I recommend avoid fried & greasy foods,  sweets/candy, white rice (brown or wild rice or Quinoa is OK), white potatoes (sweet potatoes are OK) - anything made from white flour - bagels, doughnuts, rolls, buns, biscuits,white and wheat breads, pizza crust and traditional pasta made of white flour & egg white(vegetarian pasta or spinach or wheat pasta is OK).  Multi-grain bread is OK - like multi-grain flat bread or sandwich thins. Avoid alcohol in excess. Exercise is also important.    Eat all the vegetables you want - avoid meat, especially red meat and dairy - especially cheese.  Cheese is the most concentrated form of trans-fats which is the worst thing to clog up our arteries. Veggie cheese is OK which can be found in the fresh produce section at [employer] or [employer] or [employer]  ++++++++++++++++++++++++++   Preventive Care for Adults  A healthy lifestyle and preventive care can promote health and wellness. Preventive health guidelines for women include the following key practices.  A routine   yearly physical is a good way to check with your health care provider about your health and preventive screening. It is a chance to share any concerns and updates on your health and to receive a thorough exam.  Visit your dentist for a routine exam and preventive care every 6 months. Brush your teeth twice a day and floss once a day. Good oral hygiene prevents tooth decay and gum disease.  The frequency of eye exams is based on your age, health, family medical  history, use of contact lenses, and other factors. Follow your health care provider's recommendations for frequency of eye exams.  Eat a healthy diet. Foods like vegetables, fruits, whole grains, low-fat dairy products, and lean protein foods contain the nutrients you need without too many calories. Decrease your intake of foods high in solid fats, added sugars, and salt. Eat the right amount of calories for you.Get information about a proper diet from your health care provider, if necessary.  Regular physical exercise is one of the most important things you can do for your health. Most adults should get at least 150 minutes of moderate-intensity exercise (any activity that increases your heart rate and causes you to sweat) each week. In addition, most adults need muscle-strengthening exercises on 2 or more days a week.  Maintain a healthy weight. The body mass index (BMI) is a screening tool to identify possible weight problems. It provides an estimate of body fat based on height and weight. Your health care provider can find your BMI and can help you achieve or maintain a healthy weight.For adults 20 years and older:  A BMI below 18.5 is considered underweight.  A BMI of 18.5 to 24.9 is normal.  A BMI of 25 to 29.9 is considered overweight.  A BMI of 30 and above is considered obese.  Maintain normal blood lipids and cholesterol levels by exercising and minimizing your intake of saturated fat. Eat a balanced diet with plenty of fruit and vegetables. Blood tests for lipids and cholesterol should begin at age 20 and be repeated every 5 years. If your lipid or cholesterol levels are high, you are over 50, or you are at high risk for heart disease, you may need your cholesterol levels checked more frequently.Ongoing high lipid and cholesterol levels should be treated with medicines if diet and exercise are not working.  If you smoke, find out from your health care provider how to quit. If you do  not use tobacco, do not start.  Lung cancer screening is recommended for adults aged 55-80 years who are at high risk for developing lung cancer because of a history of smoking. A yearly low-dose CT scan of the lungs is recommended for people who have at least a 30-pack-year history of smoking and are a current smoker or have quit within the past 15 years. A pack year of smoking is smoking an average of 1 pack of cigarettes a day for 1 year (for example: 1 pack a day for 30 years or 2 packs a day for 15 years). Yearly screening should continue until the smoker has stopped smoking for at least 15 years. Yearly screening should be stopped for people who develop a health problem that would prevent them from having lung cancer treatment.  High blood pressure causes heart disease and increases the risk of stroke. Your blood pressure should be checked at least every 1 to 2 years. Ongoing high blood pressure should be treated with medicines if weight loss and exercise   do not work.  If you are 55-79 years old, ask your health care provider if you should take aspirin to prevent strokes.  Diabetes screening involves taking a blood sample to check your fasting blood sugar level. This should be done once every 3 years, after age 45, if you are within normal weight and without risk factors for diabetes. Testing should be considered at a younger age or be carried out more frequently if you are overweight and have at least 1 risk factor for diabetes.  Breast cancer screening is essential preventive care for women. You should practice "breast self-awareness." This means understanding the normal appearance and feel of your breasts and may include breast self-examination. Any changes detected, no matter how small, should be reported to a health care provider. Women in their 20s and 30s should have a clinical breast exam (CBE) by a health care provider as part of a regular health exam every 1 to 3 years. After age 40, women  should have a CBE every year. Starting at age 40, women should consider having a mammogram (breast X-ray test) every year. Women who have a family history of breast cancer should talk to their health care provider about genetic screening. Women at a high risk of breast cancer should talk to their health care providers about having an MRI and a mammogram every year.  Breast cancer gene (BRCA)-related cancer risk assessment is recommended for women who have family members with BRCA-related cancers. BRCA-related cancers include breast, ovarian, tubal, and peritoneal cancers. Having family members with these cancers may be associated with an increased risk for harmful changes (mutations) in the breast cancer genes BRCA1 and BRCA2. Results of the assessment will determine the need for genetic counseling and BRCA1 and BRCA2 testing.  Routine pelvic exams to screen for cancer are no longer recommended for nonpregnant women who are considered low risk for cancer of the pelvic organs (ovaries, uterus, and vagina) and who do not have symptoms. Ask your health care provider if a screening pelvic exam is right for you.  If you have had past treatment for cervical cancer or a condition that could lead to cancer, you need Pap tests and screening for cancer for at least 20 years after your treatment. If Pap tests have been discontinued, your risk factors (such as having a new sexual partner) need to be reassessed to determine if screening should be resumed. Some women have medical problems that increase the chance of getting cervical cancer. In these cases, your health care provider may recommend more frequent screening and Pap tests.  Colorectal cancer can be detected and often prevented. Most routine colorectal cancer screening begins at the age of 50 years and continues through age 75 years. However, your health care provider may recommend screening at an earlier age if you have risk factors for colon cancer. On a  yearly basis, your health care provider may provide home test kits to check for hidden blood in the stool. Use of a small camera at the end of a tube, to directly examine the colon (sigmoidoscopy or colonoscopy), can detect the earliest forms of colorectal cancer. Talk to your health care provider about this at age 50, when routine screening begins. Direct exam of the colon should be repeated every 5-10 years through age 75 years, unless early forms of pre-cancerous polyps or small growths are found.  Hepatitis C blood testing is recommended for all people born from 1945 through 1965 and any individual with known risks for   hepatitis C.  Pra  Osteoporosis is a disease in which the bones lose minerals and strength with aging. This can result in serious bone fractures or breaks. The risk of osteoporosis can be identified using a bone density scan. Women ages 65 years and over and women at risk for fractures or osteoporosis should discuss screening with their health care providers. Ask your health care provider whether you should take a calcium supplement or vitamin D to reduce the rate of osteoporosis.  Menopause can be associated with physical symptoms and risks. Hormone replacement therapy is available to decrease symptoms and risks. You should talk to your health care provider about whether hormone replacement therapy is right for you.  Use sunscreen. Apply sunscreen liberally and repeatedly throughout the day. You should seek shade when your shadow is shorter than you. Protect yourself by wearing long sleeves, pants, a wide-brimmed hat, and sunglasses year round, whenever you are outdoors.  Once a month, do a whole body skin exam, using a mirror to look at the skin on your back. Tell your health care provider of new moles, moles that have irregular borders, moles that are larger than a pencil eraser, or moles that have changed in shape or color.  Stay current with required vaccines  (immunizations).  Influenza vaccine. All adults should be immunized every year.  Tetanus, diphtheria, and acellular pertussis (Td, Tdap) vaccine. Pregnant women should receive 1 dose of Tdap vaccine during each pregnancy. The dose should be obtained regardless of the length of time since the last dose. Immunization is preferred during the 27th-36th week of gestation. An adult who has not previously received Tdap or who does not know her vaccine status should receive 1 dose of Tdap. This initial dose should be followed by tetanus and diphtheria toxoids (Td) booster doses every 10 years. Adults with an unknown or incomplete history of completing a 3-dose immunization series with Td-containing vaccines should begin or complete a primary immunization series including a Tdap dose. Adults should receive a Td booster every 10 years.  Varicella vaccine. An adult without evidence of immunity to varicella should receive 2 doses or a second dose if she has previously received 1 dose. Pregnant females who do not have evidence of immunity should receive the first dose after pregnancy. This first dose should be obtained before leaving the health care facility. The second dose should be obtained 4-8 weeks after the first dose.  Human papillomavirus (HPV) vaccine. Females aged 13-26 years who have not received the vaccine previously should obtain the 3-dose series. The vaccine is not recommended for use in pregnant females. However, pregnancy testing is not needed before receiving a dose. If a female is found to be pregnant after receiving a dose, no treatment is needed. In that case, the remaining doses should be delayed until after the pregnancy. Immunization is recommended for any person with an immunocompromised condition through the age of 26 years if she did not get any or all doses earlier. During the 3-dose series, the second dose should be obtained 4-8 weeks after the first dose. The third dose should be obtained  24 weeks after the first dose and 16 weeks after the second dose.  Zoster vaccine. One dose is recommended for adults aged 60 years or older unless certain conditions are present.  Measles, mumps, and rubella (MMR) vaccine. Adults born before 1957 generally are considered immune to measles and mumps. Adults born in 1957 or later should have 1 or more doses of MMR   vaccine unless there is a contraindication to the vaccine or there is laboratory evidence of immunity to each of the three diseases. A routine second dose of MMR vaccine should be obtained at least 28 days after the first dose for students attending postsecondary schools, health care workers, or international travelers. People who received inactivated measles vaccine or an unknown type of measles vaccine during 1963-1967 should receive 2 doses of MMR vaccine. People who received inactivated mumps vaccine or an unknown type of mumps vaccine before 1979 and are at high risk for mumps infection should consider immunization with 2 doses of MMR vaccine. For females of childbearing age, rubella immunity should be determined. If there is no evidence of immunity, females who are not pregnant should be vaccinated. If there is no evidence of immunity, females who are pregnant should delay immunization until after pregnancy. Unvaccinated health care workers born before 1957 who lack laboratory evidence of measles, mumps, or rubella immunity or laboratory confirmation of disease should consider measles and mumps immunization with 2 doses of MMR vaccine or rubella immunization with 1 dose of MMR vaccine.  Pneumococcal 13-valent conjugate (PCV13) vaccine. When indicated, a person who is uncertain of her immunization history and has no record of immunization should receive the PCV13 vaccine. An adult aged 19 years or older who has certain medical conditions and has not been previously immunized should receive 1 dose of PCV13 vaccine. This PCV13 should be followed  with a dose of pneumococcal polysaccharide (PPSV23) vaccine. The PPSV23 vaccine dose should be obtained at least 8 weeks after the dose of PCV13 vaccine. An adult aged 19 years or older who has certain medical conditions and previously received 1 or more doses of PPSV23 vaccine should receive 1 dose of PCV13. The PCV13 vaccine dose should be obtained 1 or more years after the last PPSV23 vaccine dose.    Pneumococcal polysaccharide (PPSV23) vaccine. When PCV13 is also indicated, PCV13 should be obtained first. All adults aged 65 years and older should be immunized. An adult younger than age 65 years who has certain medical conditions should be immunized. Any person who resides in a nursing home or long-term care facility should be immunized. An adult smoker should be immunized. People with an immunocompromised condition and certain other conditions should receive both PCV13 and PPSV23 vaccines. People with human immunodeficiency virus (HIV) infection should be immunized as soon as possible after diagnosis. Immunization during chemotherapy or radiation therapy should be avoided. Routine use of PPSV23 vaccine is not recommended for American Indians, [place] Natives, or people younger than 65 years unless there are medical conditions that require PPSV23 vaccine. When indicated, people who have unknown immunization and have no record of immunization should receive PPSV23 vaccine. One-time revaccination 5 years after the first dose of PPSV23 is recommended for people aged 19-64 years who have chronic kidney failure, nephrotic syndrome, asplenia, or immunocompromised conditions. People who received 1-2 doses of PPSV23 before age 65 years should receive another dose of PPSV23 vaccine at age 65 years or later if at least 5 years have passed since the previous dose. Doses of PPSV23 are not needed for people immunized with PPSV23 at or after age 65 years.  Preventive Services / Frequency   Ages 40 to 64 years  Blood  pressure check.  Lipid and cholesterol check.  Lung cancer screening. / Every year if you are aged 55-80 years and have a 30-pack-year history of smoking and currently smoke or have quit within the past 15   years. Yearly screening is stopped once you have quit smoking for at least 15 years or develop a health problem that would prevent you from having lung cancer treatment.  Clinical breast exam.** / Every year after age 40 years.  BRCA-related cancer risk assessment.** / For women who have family members with a BRCA-related cancer (breast, ovarian, tubal, or peritoneal cancers).  Mammogram.** / Every year beginning at age 40 years and continuing for as long as you are in good health. Consult with your health care provider.  Pap test.** / Every 3 years starting at age 30 years through age 65 or 70 years with a history of 3 consecutive normal Pap tests.  HPV screening.** / Every 3 years from ages 30 years through ages 65 to 70 years with a history of 3 consecutive normal Pap tests.  Fecal occult blood test (FOBT) of stool. / Every year beginning at age 50 years and continuing until age 75 years. You may not need to do this test if you get a colonoscopy every 10 years.  Flexible sigmoidoscopy or colonoscopy.** / Every 5 years for a flexible sigmoidoscopy or every 10 years for a colonoscopy beginning at age 50 years and continuing until age 75 years.  Hepatitis C blood test.** / For all people born from 1945 through 1965 and any individual with known risks for hepatitis C.  Skin self-exam. / Monthly.  Influenza vaccine. / Every year.  Tetanus, diphtheria, and acellular pertussis (Tdap/Td) vaccine.** / Consult your health care provider. Pregnant women should receive 1 dose of Tdap vaccine during each pregnancy. 1 dose of Td every 10 years.  Varicella vaccine.** / Consult your health care provider. Pregnant females who do not have evidence of immunity should receive the first dose after  pregnancy.  Zoster vaccine.** / 1 dose for adults aged 60 years or older.  Pneumococcal 13-valent conjugate (PCV13) vaccine.** / Consult your health care provider.  Pneumococcal polysaccharide (PPSV23) vaccine.** / 1 to 2 doses if you smoke cigarettes or if you have certain conditions.  Meningococcal vaccine.** / Consult your health care provider.  Hepatitis A vaccine.** / Consult your health care provider.  Hepatitis B vaccine.** / Consult your health care provider. Screening for abdominal aortic aneurysm (AAA)  by ultrasound is recommended for people over 50 who have history of high blood pressure or who are current or former smokers. 

## 2015-03-21 NOTE — Progress Notes (Signed)
Patient ID: Jacqueline Banks, female   DOB: 06-23-1966, 49 y.o.   MRN: [id number]  Annual Comprehensive Examination  This very nice 49 y.o. MWF presents for complete physical.  Patient is being screened for elevated BP, Lipids, elevated glucose and Vitamin D Deficiency.Patient reports LMP was 4-5 mo ago and denies any hot flashes. She has ongoing c/o's of hand eczema. 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 She denis GI Sx's since.    Patient's BP has been monitored expectantly and patient denies any cardiac symptoms as chest pain, palpitations, shortness of breath, dizziness or ankle swelling. Today's BP: 118/72 mmHg    Patient's hyperlipidemia is controlled with diet . Patient denies myalgias or other medication SE's. Last lipids were at goal with TC 174, HDL 86, TG 56 and LDL 77.     Patient is screened for prediabetes and patient denies reactive hypoglycemic symptoms, visual blurring, diabetic polys, or paresthesias. Last A1c was 5.4% in June 2015.       Finally, patient has history of Vitamin D Deficiency of 39  in 2014 and last Vitamin D was  36 in June 2015.      Medication Sig  . loratadine (CLARITIN) 10 MG tablet Take 10 mg  daily.   No Known Allergies  Health Maintenance  Topic Date Due  . PAP SMEAR  05/12/1984  . INFLUENZA VACCINE  04/15/2015  . TETANUS/TDAP  09/14/2020  . HIV Screening  Completed   Immunization History  Administered Date(s) Administered  . Tdap 09/14/2010   History  Substance Use Topics  . Smoking status: Never Smoker   . Smokeless tobacco: Not on file  . Alcohol Use: Yes     Comment: wine 3-4 glasses per week    ROS Constitutional: Denies fever, chills, weight loss/gain, headaches, insomnia,  night sweats, and change in appetite. Does c/o fatigue. Eyes: Denies redness, blurred vision, diplopia, discharge, itchy, watery eyes.  ENT: Denies discharge,  congestion, post nasal drip, epistaxis, sore throat, earache, hearing loss, dental pain, Tinnitus, Vertigo, Sinus pain, snoring.  Cardio: Denies chest pain, palpitations, irregular heartbeat, syncope, dyspnea, diaphoresis, orthopnea, PND, claudication, edema Respiratory: denies cough, dyspnea, DOE, pleurisy, hoarseness, laryngitis, wheezing.  Gastrointestinal: Denies dysphagia, heartburn, reflux, water brash, pain, cramps, nausea, vomiting, bloating, diarrhea, constipation, hematemesis, melena, hematochezia, jaundice, hemorrhoids Genitourinary: Denies dysuria, frequency, urgency, nocturia, hesitancy, discharge, hematuria, flank pain Breast: Breast lumps, nipple discharge, bleeding.  Musculoskeletal: Denies arthralgia, myalgia, stiffness, Jt. Swelling, pain, limp, and strain/sprain. Denies falls. Skin: Denies puritis, rash, hives, warts, acne, eczema, changing in skin lesion Neuro: No weakness, tremor, incoordination, spasms, paresthesia, pain Psychiatric: Denies confusion, memory loss, sensory loss. Denies Depression. Endocrine: Denies change in weight, skin, hair change, nocturia, and paresthesia, diabetic polys, visual blurring, hyper / hypo glycemic episodes.  Heme/Lymph: No excessive bleeding, bruising, enlarged lymph nodes.  Physical Exam  BP 118/72   Pulse 72  Temp 97.3 F   Resp 16  Ht 5' 8.5" Wt 153 lb 12.8 oz     BMI 23.04  General Appearance: Well nourished and in no apparent distress. Eyes: PERRLA, EOMs, conjunctiva no swelling or erythema, normal fundi and vessels. Sinuses: No frontal/maxillary tenderness ENT/Mouth: EACs patent / TMs  nl. Nares clear without erythema, swelling, mucoid exudates. Oral hygiene is good. No erythema, swelling, or exudate. Tongue normal, non-obstructing. Tonsils not swollen or erythematous. Hearing normal.  Neck: Supple, thyroid  normal. No bruits, nodes or JVD. Respiratory: Respiratory effort normal.  BS equal and clear bilateral without rales,  rhonci, wheezing or stridor. Cardio: Heart sounds are normal with regular rate and rhythm and no murmurs, rubs or gallops. Peripheral pulses are normal and equal bilaterally without edema. No aortic or femoral bruits. Chest: symmetric with normal excursions and percussion. Breasts: Symmetric, without lumps, nipple discharge, retractions, or fibrocystic changes.  Abdomen: Flat, soft, with bowel sounds. Nontender, no guarding, rebound, hernias, masses, or organomegaly. Small palpable tender umbilical hernia. Lymphatics: Non tender without lymphadenopathy.  Musculoskeletal: Full ROM all peripheral extremities, joint stability, 5/5 strength, and normal gait. Skin: Warm and dry without  lesions, cyanosis, clubbing or  ecchymosis. (+) eczema of palms Neuro: Cranial nerves intact, reflexes equal bilaterally. Normal muscle tone, no cerebellar symptoms. Sensation intact.  Pysch: Awake and oriented X 3, normal affect, Insight and Judgment appropriate.   Assessment and Plan  1. Elevated BP, screening  - EKG 12-Lead - TSH  2. Elevated cholesterol, screening  - Lipid panel  3. Abnormal glucose, screening  - Hemoglobin A1c - Insulin, random  4. Vitamin D deficiency  - Vit D  25 hydroxy   - encouraged to start Vit D 5,000 units daily.   5. Screening for rectal cancer   6. Other fatigue  - Vitamin B12 - Iron and TIBC - TSH  7. Medication management  - Urine Microscopic - Microalbumin / creatinine urine ratio - CBC with Differential/Platelet - BASIC METABOLIC PANEL WITH GFR - Hepatic function panel - Magnesium  8. Body mass index (BMI) of 23.0-23.9 in adult   9. Eczema Hands   10. Umbilical hernia  - surgical consult   Continue prudent diet as discussed, weight control, BP monitoring, regular exercise, and medications. Discussed med's effects and SE's. Screening labs and tests as requested with regular follow-up as recommended.  Over 40 minutes of exam, counseling, chart  review was performed.

## 2015-03-22 LAB — MICROALBUMIN / CREATININE URINE RATIO
Creatinine, Urine: 137.4 mg/dL
Microalb Creat Ratio: 5.1 mg/g (ref 0.0–30.0)
Microalb, Ur: 0.7 mg/dL (ref <2.0)

## 2015-03-22 LAB — URINALYSIS, MICROSCOPIC ONLY
Casts: NONE SEEN
Crystals: NONE SEEN

## 2015-03-22 LAB — INSULIN, RANDOM: Insulin: 12.5 u[IU]/mL (ref 2.0–19.6)

## 2015-03-22 LAB — URINE CULTURE: Colony Count: 2000

## 2015-03-22 LAB — VITAMIN D 25 HYDROXY (VIT D DEFICIENCY, FRACTURES): VIT D 25 HYDROXY: 42 ng/mL (ref 30–100)

## 2015-04-17 ENCOUNTER — Ambulatory Visit (HOSPITAL_COMMUNITY): Payer: Self-pay

## 2015-04-24 ENCOUNTER — Ambulatory Visit (HOSPITAL_COMMUNITY)
Admission: RE | Admit: 2015-04-24 | Discharge: 2015-04-24 | Disposition: A | Discharge status: Home / Self Care | Payer: 59 | Source: Ambulatory Visit | Attending: Obstetrics & Gynecology | Admitting: Obstetrics & Gynecology

## 2015-04-24 DIAGNOSIS — Z1231 Encounter for screening mammogram for malignant neoplasm of breast: Secondary | ICD-10-CM | POA: Insufficient documentation

## 2016-04-05 ENCOUNTER — Encounter: Payer: Self-pay | Admitting: Internal Medicine

## 2016-04-05 NOTE — Progress Notes (Signed)
[facility]   Jacqueline Banks, M.D.    Uvaldo Bristle. Silverio Lay, P.A.-C      Starlyn Skeans, P.A.-C   [facility]                [address] [place] [id number] Telephone [phone] Telefax [phone]  Annual  Screening/Preventative Visit And Comprehensive Evaluation & Examination     This very nice 50 y.o. MWF presents for a Wellness/Preventative Visit & comprehensive evaluation and management of multiple medical co-morbidities.  Patient has been monitored proactively for elevated BP,  Prediabetes, abnormal Chol and Vitamin D Deficiency. Does relate grief reaction to the untimely recent loss of a 50 yo younger brother with DM x 7 yrs. Was rx'd Trintellix at Grief counseling.      Patient is monitored expectantly for elevated BP & Patient's BP has been controlled at home.  Today's BP: 120/76. Patient denies any cardiac symptoms as chest pain, palpitations, shortness of breath, dizziness or ankle swelling.     Patient's hyperlipidemia is controlled with diet.  Last lipids were at goal with T Chol 181, HDL 94, Trig 42 & LDL 79 in July 2016.      Patient is screened proactively for prediabetes  and patient denies reactive hypoglycemic symptoms, visual blurring, diabetic polys or paresthesias. Last A1c was 5.4% in July 2016.      Finally, patient has history of Vitamin D Deficiency of "64" in 2014 and last vitamin D was still low at 50 in July 2016.  Medication Sig  . ciprofloxacin (CIPRO) 250 MG tablet Take 1 tablet 2 x daily with food for UTI  . fluticasone (CUTIVATE) 0.05 % cream APPLY TO AFFECTED AREA TWICE A DAY EXTERNALLY, NOT FOR FACE OR SKIN FOLDS. 14 DAYS   Trintellix 10 mg Take 1 tablet daily   . loratadine (CLARITIN) 10 MG tablet Take 10 mg by mouth daily.   No Known Allergies   Health Maintenance  Topic Date Due  . PAP SMEAR  05/13/1987  . INFLUENZA VACCINE  04/14/2016  .  TETANUS/TDAP  09/14/2020  . HIV Screening  Completed   Immunization History  Administered Date(s) Administered  . Tdap 09/14/2010   No past surgical history on file.  Social History   Social History  . Marital status: Married    Spouse name: N/A  . Number of children:  x 2 - Son & daughter  . Years of education: N/A   Occupational History  . 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   Social History Main Topics  . Smoking status: Never Smoker  . Smokeless tobacco: No  . Alcohol use Yes     Comment: wine 3-4 glasses per week  . Drug use: none  . Sexual activity: Active    ROS Constitutional: Denies fever, chills, weight loss/gain, headaches, insomnia,  night sweats or change in appetite. Does c/o fatigue. Eyes: Denies redness, blurred vision, diplopia, discharge, itchy or watery eyes.  ENT: Denies discharge, congestion, post nasal drip, epistaxis, sore throat, earache, hearing loss, dental pain, Tinnitus, Vertigo, Sinus pain or snoring.  Cardio: Denies chest pain, palpitations, irregular heartbeat, syncope, dyspnea, diaphoresis, orthopnea, PND, claudication or edema Respiratory: denies cough, dyspnea, DOE, pleurisy, hoarseness, laryngitis or wheezing.  Gastrointestinal: Denies dysphagia, heartburn, reflux, water brash, pain, cramps, nausea, vomiting, bloating, diarrhea, constipation, hematemesis, melena, hematochezia, jaundice or hemorrhoids Genitourinary:  Denies dysuria, frequency, urgency, nocturia, hesitancy, discharge, hematuria or flank pain Musculoskeletal: Denies arthralgia, myalgia, stiffness, Jt. Swelling, pain, limp or strain/sprain. Denies Falls. Skin:  Does c/o puritic rash, but no hives, warts, acne, eczema or change in skin lesion Neuro: No weakness, tremor, incoordination, spasms, paresthesia or pain Psychiatric: Denies confusion, memory loss or sensory loss. Denies Depression. Endocrine: Denies change in weight, skin, hair change, nocturia, and  paresthesia, diabetic polys, visual blurring or hyper / hypo glycemic episodes.  Heme/Lymph: No excessive bleeding, bruising or enlarged lymph nodes.  Physical Exam  BP 120/76   Pulse 64   Temp 97.3 F (36.3 C)   Resp 16   Ht 5\' 8"  (1.727 m)   Wt 164 lb 9.6 oz (74.7 kg)   BMI 25.03 kg/m   General Appearance: Well nourished, in no apparent distress.  Eyes: PERRLA, EOMs, conjunctiva no swelling or erythema, normal fundi and vessels. Sinuses: No frontal/maxillary tenderness ENT/Mouth: EACs patent / TMs  nl. Nares clear without erythema, swelling, mucoid exudates. Oral hygiene is good. No erythema, swelling, or exudate. Tongue normal, non-obstructing. Tonsils not swollen or erythematous. Hearing normal.  Neck: Supple, thyroid normal. No bruits, nodes or JVD. Respiratory: Respiratory effort normal.  BS equal and clear bilateral without rales, rhonci, wheezing or stridor. Cardio: Heart sounds are normal with regular rate and rhythm and no murmurs, rubs or gallops. Peripheral pulses are normal and equal bilaterally without edema. No aortic or femoral bruits. Chest: symmetric with normal excursions and percussion.  Breasts: No palpable abnormal masses.  Abdomen: Soft, with Nl bowel sounds. Nontender, no guarding, rebound, hernias, masses, or organomegaly.  Lymphatics: Non tender without lymphadenopathy.  Musculoskeletal: Full ROM all peripheral extremities, joint stability, 5/5 strength, and normal gait. Skin: Warm and dry without rashes, lesions, cyanosis, clubbing or  ecchymosis.  Neuro: Cranial nerves intact, reflexes equal bilaterally. Normal muscle tone, no cerebellar symptoms. Sensation intact.  Pysch: Alert and oriented X 3 with normal affect, insight and judgment appropriate.   Assessment and Plan  1. Annual Preventative/Screening Exam   - Microalbumin / creatinine urine ratio - EKG 12-Lead - POC Hemoccult Bld/Stl  - Urinalysis, Routine w reflex microscopic  - Iron and TIBC -  CBC with Differential/Platelet - BASIC METABOLIC PANEL WITH GFR - Hepatic function panel - Magnesium - Lipid panel - TSH - Hemoglobin A1c - Insulin, random - VITAMIN D 25 Hydroxy  - Vitamin B12 - TRINTELLIX 10 MG TABS; Take 1 tablet by mouth daily.; Refill: 2 - fluocinonide cream (LIDEX) 0.05 %;   2. Elevated BP  - Microalbumin / creatinine urine ratio - EKG 12-Lead - TSH  3. Screening cholesterol level  - Lipid panel - TSH  4. Other abnormal glucose  - Hemoglobin A1c - Insulin, random  5. Vitamin D deficiency  - VITAMIN D 25 Hydroxy   6. Screening for ischemic heart disease   7. Encounter for screening for malignant neoplasm of rectum  - POC Hemoccult Bld/Stl   8. Low vitamin B12 level  - Vitamin B12  9. Other fatigue  - Iron and TIBC - TSH  10. Medication management  - Urinalysis, Routine w reflex microscopic  - CBC with Differential/Platelet - BASIC METABOLIC PANEL WITH GFR - Hepatic function panel - Magnesium   Continue prudent diet as discussed, weight control, BP monitoring, regular exercise, and medications as discussed.  Discussed med effects and SE's. Routine screening labs and tests as requested with regular follow-up as recommended. Over 40 minutes of exam, counseling, chart  review and high complex critical decision making was performed

## 2016-04-05 NOTE — Patient Instructions (Signed)
Recommend Adult Low Dose Aspirin or   coated  Aspirin 81 mg daily   To reduce risk of Colon Cancer 20 %,   Skin Cancer 26 % ,   Melanoma 46%   and   Pancreatic cancer 60%   ++++++++++++++++++++++++++++++++++++++++++++++++++++++ Vitamin D goal   is between 70-100.   Please make sure that you are taking your Vitamin D as directed.   It is very important as a natural anti-inflammatory   helping hair, skin, and nails, as well as reducing stroke and heart attack risk.   It helps your bones and helps with mood.  It also decreases numerous cancer risks so please take it as directed.   Low Vit D is associated with a 200-300% higher risk for CANCER   and 200-300% higher risk for HEART   ATTACK  &  STROKE.   ......................................  It is also associated with higher death rate at younger ages,   autoimmune diseases like Rheumatoid arthritis, Lupus, Multiple Sclerosis.     Also many other serious conditions, like depression, Alzheimer's  Dementia, infertility, muscle aches, fatigue, fibromyalgia - just to name a few.  ++++++++++++++++++++++++++++++++++++++++++++++++  Recommend the book "The END of DIETING" by Dr Joel Fuhrman   & the book "The END of DIABETES " by Dr Joel Fuhrman  At [employer] - get book & Audio CD's     Being diabetic has a  300% increased risk for heart attack, stroke, cancer, and alzheimer- type vascular dementia. It is very important that you work harder with diet by avoiding all foods that are white. Avoid white rice (brown & wild rice is OK), white potatoes (sweetpotatoes in moderation is OK), White bread or wheat bread or anything made out of white flour like bagels, donuts, rolls, buns, biscuits, cakes, pastries, cookies, pizza crust, and pasta (made from white flour & egg whites) - vegetarian pasta or spinach or wheat pasta is OK. Multigrain breads like Arnold's or [employer], or multigrain sandwich thins or flatbreads.  Diet,  exercise and weight loss can reverse and cure diabetes in the early stages.  Diet, exercise and weight loss is very important in the control and prevention of complications of diabetes which affects every system in your body, ie. Brain - dementia/stroke, eyes - glaucoma/blindness, heart - heart attack/heart failure, kidneys - dialysis, stomach - gastric paralysis, intestines - malabsorption, nerves - severe painful neuritis, circulation - gangrene & loss of a leg(s), and finally cancer and Alzheimers.    I recommend avoid fried & greasy foods,  sweets/candy, white rice (brown or wild rice or Quinoa is OK), white potatoes (sweet potatoes are OK) - anything made from white flour - bagels, doughnuts, rolls, buns, biscuits,white and wheat breads, pizza crust and traditional pasta made of white flour & egg white(vegetarian pasta or spinach or wheat pasta is OK).  Multi-grain bread is OK - like multi-grain flat bread or sandwich thins. Avoid alcohol in excess. Exercise is also important.    Eat all the vegetables you want - avoid meat, especially red meat and dairy - especially cheese.  Cheese is the most concentrated form of trans-fats which is the worst thing to clog up our arteries. Veggie cheese is OK which can be found in the fresh produce section at [employer] or [employer] or [employer]  ++++++++++++++++++++++++++++++++++++++++++++++++++ DASH Eating Plan  DASH stands for "Dietary Approaches to Stop Hypertension."   The DASH eating plan is a healthy eating plan that has been shown to reduce high blood   pressure (hypertension). Additional health benefits may include reducing the risk of type 2 diabetes mellitus, heart disease, and stroke. The DASH eating plan may also help with weight loss.  WHAT DO I NEED TO KNOW ABOUT THE DASH EATING PLAN?  For the DASH eating plan, you will follow these general guidelines:  Choose foods with a percent daily value for sodium of less than 5% (as listed on the food  label).  Use salt-free seasonings or herbs instead of table salt or sea salt.  Check with your health care provider or pharmacist before using salt substitutes.  Eat lower-sodium products, often labeled as "lower sodium" or "no salt added."  Eat fresh foods.  Eat more vegetables, fruits, and low-fat dairy products.    Choose whole grains. Look for the word "whole" as the first word in the ingredient list.  Choose fish   Limit sweets, desserts, sugars, and sugary drinks.  Choose heart-healthy fats.  Eat veggie cheese   Eat more home-cooked food and less restaurant, buffet, and fast food.  Limit fried foods.  Cook foods using methods other than frying.  Limit canned vegetables. If you do use them, rinse them well to decrease the sodium.  When eating at a restaurant, ask that your food be prepared with less salt, or no salt if possible.                      WHAT FOODS CAN I EAT?  Read Dr Joel Fuhrman's books on The End of Dieting & The End of Diabetes  Grains  Whole grain or whole wheat bread. Brown rice. Whole grain or whole wheat pasta. Quinoa, bulgur, and whole grain cereals. Low-sodium cereals. Corn or whole wheat flour tortillas. Whole grain cornbread. Whole grain crackers. Low-sodium crackers.  Vegetables  Fresh or frozen vegetables (raw, steamed, roasted, or grilled). Low-sodium or reduced-sodium tomato and vegetable juices. Low-sodium or reduced-sodium tomato sauce and paste. Low-sodium or reduced-sodium canned vegetables.   Fruits  All fresh, canned (in natural juice), or frozen fruits.  Protein Products   All fish and seafood.  Dried beans, peas, or lentils. Unsalted nuts and seeds. Unsalted canned beans.  Dairy  Low-fat dairy products, such as skim or 1% milk, 2% or reduced-fat cheeses, low-fat ricotta or cottage cheese, or plain low-fat yogurt. Low-sodium or reduced-sodium cheeses.  Fats and Oils  Tub margarines without trans fats. Light or  reduced-fat mayonnaise and salad dressings (reduced sodium). Avocado. Safflower, olive, or canola oils. Natural peanut or almond butter.  Other  Unsalted popcorn and pretzels. The items listed above may not be a complete list of recommended foods or beverages. Contact your dietitian for more options.  +++++++++++++++++++++++++++++++++++++++++++  WHAT FOODS ARE NOT RECOMMENDED?  Grains/ White flour or wheat flour  White bread. White pasta. White rice. Refined cornbread. Bagels and croissants. Crackers that contain trans fat.  Vegetables  Creamed or fried vegetables. Vegetables in a . Regular canned vegetables. Regular canned tomato sauce and paste. Regular tomato and vegetable juices.  Fruits  Dried fruits. Canned fruit in light or heavy syrup. Fruit juice.  Meat and Other Protein Products  Meat in general - RED mwaet & White meat.  Fatty cuts of meat. Ribs, chicken wings, bacon, sausage, bologna, salami, chitterlings, fatback, hot dogs, bratwurst, and packaged luncheon meats.  Dairy  Whole or 2% milk, cream, half-and-half, and cream cheese. Whole-fat or sweetened yogurt. Full-fat cheeses or blue cheese. Nondairy creamers and whipped toppings. Processed cheese, cheese spreads, or   cheese curds.  Condiments  Onion and garlic salt, seasoned salt, table salt, and sea salt. Canned and packaged gravies. Worcestershire sauce. Tartar sauce. Barbecue sauce. Teriyaki sauce. Soy sauce, including reduced sodium. Steak sauce. Fish sauce. Oyster sauce. Cocktail sauce. Horseradish. Ketchup and mustard. Meat flavorings and tenderizers. Bouillon cubes. Hot sauce. Tabasco sauce. Marinades. Taco seasonings. Relishes.  Fats and Oils Butter, stick margarine, lard, shortening and bacon fat. Coconut, palm kernel, or palm oils. Regular salad dressings.  Pickles and olives. Salted popcorn and pretzels.  The items listed above may not be a complete list of foods and beverages to avoid.   Preventive  Care for Adults  A healthy lifestyle and preventive care can promote health and wellness. Preventive health guidelines for women include the following key practices.  A routine yearly physical is a good way to check with your health care provider about your health and preventive screening. It is a chance to share any concerns and updates on your health and to receive a thorough exam.  Visit your dentist for a routine exam and preventive care every 6 months. Brush your teeth twice a day and floss once a day. Good oral hygiene prevents tooth decay and gum disease.  The frequency of eye exams is based on your age, health, family medical history, use of contact lenses, and other factors. Follow your health care provider's recommendations for frequency of eye exams.  Eat a healthy diet. Foods like vegetables, fruits, whole grains, low-fat dairy products, and lean protein foods contain the nutrients you need without too many calories. Decrease your intake of foods high in solid fats, added sugars, and salt. Eat the right amount of calories for you.Get information about a proper diet from your health care provider, if necessary.  Regular physical exercise is one of the most important things you can do for your health. Most adults should get at least 150 minutes of moderate-intensity exercise (any activity that increases your heart rate and causes you to sweat) each week. In addition, most adults need muscle-strengthening exercises on 2 or more days a week.  Maintain a healthy weight. The body mass index (BMI) is a screening tool to identify possible weight problems. It provides an estimate of body fat based on height and weight. Your health care provider can find your BMI and can help you achieve or maintain a healthy weight.For adults 20 years and older:  A BMI below 18.5 is considered underweight.  A BMI of 18.5 to 24.9 is normal.  A BMI of 25 to 29.9 is considered overweight.  A BMI of 30 and  above is considered obese.  Maintain normal blood lipids and cholesterol levels by exercising and minimizing your intake of saturated fat. Eat a balanced diet with plenty of fruit and vegetables. Blood tests for lipids and cholesterol should begin at age 20 and be repeated every 5 years. If your lipid or cholesterol levels are high, you are over 50, or you are at high risk for heart disease, you may need your cholesterol levels checked more frequently.Ongoing high lipid and cholesterol levels should be treated with medicines if diet and exercise are not working.  If you smoke, find out from your health care provider how to quit. If you do not use tobacco, do not start.  Lung cancer screening is recommended for adults aged 55-80 years who are at high risk for developing lung cancer because of a history of smoking. A yearly low-dose CT scan of the lungs is recommended   for people who have at least a 30-pack-year history of smoking and are a current smoker or have quit within the past 15 years. A pack year of smoking is smoking an average of 1 pack of cigarettes a day for 1 year (for example: 1 pack a day for 30 years or 2 packs a day for 15 years). Yearly screening should continue until the smoker has stopped smoking for at least 15 years. Yearly screening should be stopped for people who develop a health problem that would prevent them from having lung cancer treatment.  High blood pressure causes heart disease and increases the risk of stroke. Your blood pressure should be checked at least every 1 to 2 years. Ongoing high blood pressure should be treated with medicines if weight loss and exercise do not work.  If you are 55-79 years old, ask your health care provider if you should take aspirin to prevent strokes.  Diabetes screening involves taking a blood sample to check your fasting blood sugar level. This should be done once every 3 years, after age 45, if you are within normal weight and without risk  factors for diabetes. Testing should be considered at a younger age or be carried out more frequently if you are overweight and have at least 1 risk factor for diabetes.  Breast cancer screening is essential preventive care for women. You should practice "breast self-awareness." This means understanding the normal appearance and feel of your breasts and may include breast self-examination. Any changes detected, no matter how small, should be reported to a health care provider. Women in their 20s and 30s should have a clinical breast exam (CBE) by a health care provider as part of a regular health exam every 1 to 3 years. After age 40, women should have a CBE every year. Starting at age 40, women should consider having a mammogram (breast X-ray test) every year. Women who have a family history of breast cancer should talk to their health care provider about genetic screening. Women at a high risk of breast cancer should talk to their health care providers about having an MRI and a mammogram every year.  Breast cancer gene (BRCA)-related cancer risk assessment is recommended for women who have family members with BRCA-related cancers. BRCA-related cancers include breast, ovarian, tubal, and peritoneal cancers. Having family members with these cancers may be associated with an increased risk for harmful changes (mutations) in the breast cancer genes BRCA1 and BRCA2. Results of the assessment will determine the need for genetic counseling and BRCA1 and BRCA2 testing.  Routine pelvic exams to screen for cancer are no longer recommended for nonpregnant women who are considered low risk for cancer of the pelvic organs (ovaries, uterus, and vagina) and who do not have symptoms. Ask your health care provider if a screening pelvic exam is right for you.  If you have had past treatment for cervical cancer or a condition that could lead to cancer, you need Pap tests and screening for cancer for at least 20 years after  your treatment. If Pap tests have been discontinued, your risk factors (such as having a new sexual partner) need to be reassessed to determine if screening should be resumed. Some women have medical problems that increase the chance of getting cervical cancer. In these cases, your health care provider may recommend more frequent screening and Pap tests.  Colorectal cancer can be detected and often prevented. Most routine colorectal cancer screening begins at the age of 50 years and   continues through age 75 years. However, your health care provider may recommend screening at an earlier age if you have risk factors for colon cancer. On a yearly basis, your health care provider may provide home test kits to check for hidden blood in the stool. Use of a small camera at the end of a tube, to directly examine the colon (sigmoidoscopy or colonoscopy), can detect the earliest forms of colorectal cancer. Talk to your health care provider about this at age 50, when routine screening begins. Direct exam of the colon should be repeated every 5-10 years through age 75 years, unless early forms of pre-cancerous polyps or small growths are found.  Hepatitis C blood testing is recommended for all people born from 1945 through 1965 and any individual with known risks for hepatitis C.  Pra  Osteoporosis is a disease in which the bones lose minerals and strength with aging. This can result in serious bone fractures or breaks. The risk of osteoporosis can be identified using a bone density scan. Women ages 65 years and over and women at risk for fractures or osteoporosis should discuss screening with their health care providers. Ask your health care provider whether you should take a calcium supplement or vitamin D to reduce the rate of osteoporosis.  Menopause can be associated with physical symptoms and risks. Hormone replacement therapy is available to decrease symptoms and risks. You should talk to your health care  provider about whether hormone replacement therapy is right for you.  Use sunscreen. Apply sunscreen liberally and repeatedly throughout the day. You should seek shade when your shadow is shorter than you. Protect yourself by wearing long sleeves, pants, a wide-brimmed hat, and sunglasses year round, whenever you are outdoors.  Once a month, do a whole body skin exam, using a mirror to look at the skin on your back. Tell your health care provider of new moles, moles that have irregular borders, moles that are larger than a pencil eraser, or moles that have changed in shape or color.  Stay current with required vaccines (immunizations).  Influenza vaccine. All adults should be immunized every year.  Tetanus, diphtheria, and acellular pertussis (Td, Tdap) vaccine. Pregnant women should receive 1 dose of Tdap vaccine during each pregnancy. The dose should be obtained regardless of the length of time since the last dose. Immunization is preferred during the 27th-36th week of gestation. An adult who has not previously received Tdap or who does not know her vaccine status should receive 1 dose of Tdap. This initial dose should be followed by tetanus and diphtheria toxoids (Td) booster doses every 10 years. Adults with an unknown or incomplete history of completing a 3-dose immunization series with Td-containing vaccines should begin or complete a primary immunization series including a Tdap dose. Adults should receive a Td booster every 10 years.  Varicella vaccine. An adult without evidence of immunity to varicella should receive 2 doses or a second dose if she has previously received 1 dose. Pregnant females who do not have evidence of immunity should receive the first dose after pregnancy. This first dose should be obtained before leaving the health care facility. The second dose should be obtained 4-8 weeks after the first dose.  Human papillomavirus (HPV) vaccine. Females aged 13-26 years who have not  received the vaccine previously should obtain the 3-dose series. The vaccine is not recommended for use in pregnant females. However, pregnancy testing is not needed before receiving a dose. If a female is found to   be pregnant after receiving a dose, no treatment is needed. In that case, the remaining doses should be delayed until after the pregnancy. Immunization is recommended for any person with an immunocompromised condition through the age of 26 years if she did not get any or all doses earlier. During the 3-dose series, the second dose should be obtained 4-8 weeks after the first dose. The third dose should be obtained 24 weeks after the first dose and 16 weeks after the second dose.  Zoster vaccine. One dose is recommended for adults aged 60 years or older unless certain conditions are present.  Measles, mumps, and rubella (MMR) vaccine. Adults born before 1957 generally are considered immune to measles and mumps. Adults born in 1957 or later should have 1 or more doses of MMR vaccine unless there is a contraindication to the vaccine or there is laboratory evidence of immunity to each of the three diseases. A routine second dose of MMR vaccine should be obtained at least 28 days after the first dose for students attending postsecondary schools, health care workers, or international travelers. People who received inactivated measles vaccine or an unknown type of measles vaccine during 1963-1967 should receive 2 doses of MMR vaccine. People who received inactivated mumps vaccine or an unknown type of mumps vaccine before 1979 and are at high risk for mumps infection should consider immunization with 2 doses of MMR vaccine. For females of childbearing age, rubella immunity should be determined. If there is no evidence of immunity, females who are not pregnant should be vaccinated. If there is no evidence of immunity, females who are pregnant should delay immunization until after pregnancy. Unvaccinated  health care workers born before 1957 who lack laboratory evidence of measles, mumps, or rubella immunity or laboratory confirmation of disease should consider measles and mumps immunization with 2 doses of MMR vaccine or rubella immunization with 1 dose of MMR vaccine.  Pneumococcal 13-valent conjugate (PCV13) vaccine. When indicated, a person who is uncertain of her immunization history and has no record of immunization should receive the PCV13 vaccine. An adult aged 19 years or older who has certain medical conditions and has not been previously immunized should receive 1 dose of PCV13 vaccine. This PCV13 should be followed with a dose of pneumococcal polysaccharide (PPSV23) vaccine. The PPSV23 vaccine dose should be obtained at least 8 weeks after the dose of PCV13 vaccine. An adult aged 19 years or older who has certain medical conditions and previously received 1 or more doses of PPSV23 vaccine should receive 1 dose of PCV13. The PCV13 vaccine dose should be obtained 1 or more years after the last PPSV23 vaccine dose.    Pneumococcal polysaccharide (PPSV23) vaccine. When PCV13 is also indicated, PCV13 should be obtained first. All adults aged 65 years and older should be immunized. An adult younger than age 65 years who has certain medical conditions should be immunized. Any person who resides in a nursing home or long-term care facility should be immunized. An adult smoker should be immunized. People with an immunocompromised condition and certain other conditions should receive both PCV13 and PPSV23 vaccines. People with human immunodeficiency virus (HIV) infection should be immunized as soon as possible after diagnosis. Immunization during chemotherapy or radiation therapy should be avoided. Routine use of PPSV23 vaccine is not recommended for American Indians, [place] Natives, or people younger than 65 years unless there are medical conditions that require PPSV23 vaccine. When indicated, people who  have unknown immunization and have no record of   immunization should receive PPSV23 vaccine. One-time revaccination 5 years after the first dose of PPSV23 is recommended for people aged 19-64 years who have chronic kidney failure, nephrotic syndrome, asplenia, or immunocompromised conditions. People who received 1-2 doses of PPSV23 before age 65 years should receive another dose of PPSV23 vaccine at age 65 years or later if at least 5 years have passed since the previous dose. Doses of PPSV23 are not needed for people immunized with PPSV23 at or after age 65 years.  Preventive Services / Frequency   Ages 40 to 64 years  Blood pressure check.  Lipid and cholesterol check.  Lung cancer screening. / Every year if you are aged 55-80 years and have a 30-pack-year history of smoking and currently smoke or have quit within the past 15 years. Yearly screening is stopped once you have quit smoking for at least 15 years or develop a health problem that would prevent you from having lung cancer treatment.  Clinical breast exam.** / Every year after age 40 years.  BRCA-related cancer risk assessment.** / For women who have family members with a BRCA-related cancer (breast, ovarian, tubal, or peritoneal cancers).  Mammogram.** / Every year beginning at age 40 years and continuing for as long as you are in good health. Consult with your health care provider.  Pap test.** / Every 3 years starting at age 30 years through age 65 or 70 years with a history of 3 consecutive normal Pap tests.  HPV screening.** / Every 3 years from ages 30 years through ages 65 to 70 years with a history of 3 consecutive normal Pap tests.  Fecal occult blood test (FOBT) of stool. / Every year beginning at age 50 years and continuing until age 75 years. You may not need to do this test if you get a colonoscopy every 10 years.  Flexible sigmoidoscopy or colonoscopy.** / Every 5 years for a flexible sigmoidoscopy or every 10 years  for a colonoscopy beginning at age 50 years and continuing until age 75 years.  Hepatitis C blood test.** / For all people born from 1945 through 1965 and any individual with known risks for hepatitis C.  Skin self-exam. / Monthly.  Influenza vaccine. / Every year.  Tetanus, diphtheria, and acellular pertussis (Tdap/Td) vaccine.** / Consult your health care provider. Pregnant women should receive 1 dose of Tdap vaccine during each pregnancy. 1 dose of Td every 10 years.  Varicella vaccine.** / Consult your health care provider. Pregnant females who do not have evidence of immunity should receive the first dose after pregnancy.  Zoster vaccine.** / 1 dose for adults aged 60 years or older.  Pneumococcal 13-valent conjugate (PCV13) vaccine.** / Consult your health care provider.  Pneumococcal polysaccharide (PPSV23) vaccine.** / 1 to 2 doses if you smoke cigarettes or if you have certain conditions.  Meningococcal vaccine.** / Consult your health care provider.  Hepatitis A vaccine.** / Consult your health care provider.  Hepatitis B vaccine.** / Consult your health care provider. Screening for abdominal aortic aneurysm (AAA)  by ultrasound is recommended for people over 50 who have history of high blood pressure or who are current or former smokers.  

## 2016-04-06 ENCOUNTER — Ambulatory Visit (INDEPENDENT_AMBULATORY_CARE_PROVIDER_SITE_OTHER): Payer: Managed Care, Other (non HMO) | Admitting: Internal Medicine

## 2016-04-06 ENCOUNTER — Encounter: Payer: Self-pay | Admitting: Internal Medicine

## 2016-04-06 VITALS — BP 120/76 | HR 64 | Temp 97.3°F | Resp 16 | Ht 68.0 in | Wt 164.6 lb

## 2016-04-06 DIAGNOSIS — Z0000 Encounter for general adult medical examination without abnormal findings: Secondary | ICD-10-CM | POA: Diagnosis not present

## 2016-04-06 DIAGNOSIS — R030 Elevated blood-pressure reading, without diagnosis of hypertension: Secondary | ICD-10-CM | POA: Diagnosis not present

## 2016-04-06 DIAGNOSIS — F339 Major depressive disorder, recurrent, unspecified: Secondary | ICD-10-CM

## 2016-04-06 DIAGNOSIS — E559 Vitamin D deficiency, unspecified: Secondary | ICD-10-CM

## 2016-04-06 DIAGNOSIS — E538 Deficiency of other specified B group vitamins: Secondary | ICD-10-CM

## 2016-04-06 DIAGNOSIS — R5383 Other fatigue: Secondary | ICD-10-CM

## 2016-04-06 DIAGNOSIS — Z79899 Other long term (current) drug therapy: Secondary | ICD-10-CM | POA: Diagnosis not present

## 2016-04-06 DIAGNOSIS — F4321 Adjustment disorder with depressed mood: Secondary | ICD-10-CM

## 2016-04-06 DIAGNOSIS — Z136 Encounter for screening for cardiovascular disorders: Secondary | ICD-10-CM

## 2016-04-06 DIAGNOSIS — Z0001 Encounter for general adult medical examination with abnormal findings: Secondary | ICD-10-CM

## 2016-04-06 DIAGNOSIS — Z13220 Encounter for screening for lipoid disorders: Secondary | ICD-10-CM

## 2016-04-06 DIAGNOSIS — Z1212 Encounter for screening for malignant neoplasm of rectum: Secondary | ICD-10-CM

## 2016-04-06 DIAGNOSIS — R7309 Other abnormal glucose: Secondary | ICD-10-CM

## 2016-04-06 DIAGNOSIS — IMO0001 Reserved for inherently not codable concepts without codable children: Secondary | ICD-10-CM

## 2016-04-06 HISTORY — DX: Major depressive disorder, recurrent, unspecified: F33.9

## 2016-04-06 LAB — IRON AND TIBC
%SAT: 27 % (ref 11–50)
Iron: 100 ug/dL (ref 40–190)
TIBC: 369 ug/dL (ref 250–450)
UIBC: 269 ug/dL (ref 125–400)

## 2016-04-06 LAB — BASIC METABOLIC PANEL WITH GFR
BUN: 17 mg/dL (ref 7–25)
CHLORIDE: 101 mmol/L (ref 98–110)
CO2: 28 mmol/L (ref 20–31)
CREATININE: 0.66 mg/dL (ref 0.50–1.10)
Calcium: 9.2 mg/dL (ref 8.6–10.2)
GFR, Est African American: >89 mL/min (ref >=60)
GFR, Est Non African American: >89 mL/min (ref >=60)
GLUCOSE: 80 mg/dL (ref 65–99)
POTASSIUM: 4.2 mmol/L (ref 3.5–5.3)
Sodium: 137 mmol/L (ref 135–146)

## 2016-04-06 LAB — HEMOGLOBIN A1C
HEMOGLOBIN A1C: 5.2 % (ref <5.7)
MEAN PLASMA GLUCOSE: 103 mg/dL

## 2016-04-06 LAB — CBC WITH DIFFERENTIAL/PLATELET
BASOS PCT: 1 %
Basophils Absolute: 72 cells/uL (ref 0–200)
EOS ABS: 288 {cells}/uL (ref 15–500)
Eosinophils Relative: 4 %
HEMATOCRIT: 41.5 % (ref 35.0–45.0)
Hemoglobin: 13.8 g/dL (ref 11.7–15.5)
LYMPHS PCT: 28 %
Lymphs Abs: 2016 cells/uL (ref 850–3900)
MCH: 30 pg (ref 27.0–33.0)
MCHC: 33.3 g/dL (ref 32.0–36.0)
MCV: 90.2 fL (ref 80.0–100.0)
MONOS PCT: 8 %
MPV: 9.9 fL (ref 7.5–12.5)
Monocytes Absolute: 576 cells/uL (ref 200–950)
Neutro Abs: 4248 cells/uL (ref 1500–7800)
Neutrophils Relative %: 59 %
PLATELETS: 283 10*3/uL (ref 140–400)
RBC: 4.6 MIL/uL (ref 3.80–5.10)
RDW: 13.3 % (ref 11.0–15.0)
WBC: 7.2 10*3/uL (ref 3.8–10.8)

## 2016-04-06 LAB — MAGNESIUM: MAGNESIUM: 1.9 mg/dL (ref 1.5–2.5)

## 2016-04-06 LAB — HEPATIC FUNCTION PANEL
ALBUMIN: 4.4 g/dL (ref 3.6–5.1)
ALK PHOS: 62 U/L (ref 33–115)
ALT: 16 U/L (ref 6–29)
AST: 20 U/L (ref 10–35)
BILIRUBIN TOTAL: 0.5 mg/dL (ref 0.2–1.2)
Bilirubin, Direct: 0.1 mg/dL (ref <=0.2)
Indirect Bilirubin: 0.4 mg/dL (ref 0.2–1.2)
TOTAL PROTEIN: 7 g/dL (ref 6.1–8.1)

## 2016-04-06 LAB — LIPID PANEL
CHOL/HDL RATIO: 1.8 ratio (ref <=5.0)
Cholesterol: 186 mg/dL (ref 125–200)
HDL: 103 mg/dL (ref >=46)
LDL CALC: 72 mg/dL (ref <130)
Triglycerides: 55 mg/dL (ref <150)
VLDL: 11 mg/dL (ref <30)

## 2016-04-06 LAB — TSH: TSH: 2.87 mIU/L

## 2016-04-06 LAB — VITAMIN B12: VITAMIN B 12: 357 pg/mL (ref 200–1100)

## 2016-04-06 MED ORDER — MONTELUKAST SODIUM 10 MG PO TABS: ORAL_TABLET | ORAL | 0 refills | Status: DC

## 2016-04-07 LAB — URINALYSIS, ROUTINE W REFLEX MICROSCOPIC
Bilirubin Urine: NEGATIVE
Glucose, UA: NEGATIVE
HGB URINE DIPSTICK: NEGATIVE
KETONES UR: NEGATIVE
LEUKOCYTES UA: NEGATIVE
NITRITE: NEGATIVE
PROTEIN: NEGATIVE
Specific Gravity, Urine: 1.021 (ref 1.001–1.035)
pH: 6 (ref 5.0–8.0)

## 2016-04-07 LAB — MICROALBUMIN / CREATININE URINE RATIO
Creatinine, Urine: 104 mg/dL (ref 20–320)
MICROALB UR: 0.4 mg/dL
MICROALB/CREAT RATIO: 4 ug/mg{creat} (ref <30)

## 2016-04-07 LAB — INSULIN, RANDOM: Insulin: 3.7 u[IU]/mL (ref 2.0–19.6)

## 2016-04-07 LAB — VITAMIN D 25 HYDROXY (VIT D DEFICIENCY, FRACTURES): Vit D, 25-Hydroxy: 39 ng/mL (ref 30–100)

## 2017-04-30 ENCOUNTER — Encounter: Payer: Self-pay | Admitting: Internal Medicine

## 2017-05-28 ENCOUNTER — Encounter: Payer: Self-pay | Admitting: Internal Medicine

## 2017-05-30 NOTE — Progress Notes (Signed)
R  E  S  H  E  D  U  L  E  D

## 2017-05-31 ENCOUNTER — Ambulatory Visit: Payer: Self-pay | Admitting: Internal Medicine

## 2017-07-13 ENCOUNTER — Ambulatory Visit (INDEPENDENT_AMBULATORY_CARE_PROVIDER_SITE_OTHER): Payer: BLUE CROSS/BLUE SHIELD | Admitting: Internal Medicine

## 2017-07-13 ENCOUNTER — Encounter: Payer: Self-pay | Admitting: Internal Medicine

## 2017-07-13 VITALS — BP 98/64 | HR 76 | Temp 97.9°F | Resp 16 | Ht 68.25 in | Wt 164.4 lb

## 2017-07-13 DIAGNOSIS — Z13220 Encounter for screening for lipoid disorders: Secondary | ICD-10-CM

## 2017-07-13 DIAGNOSIS — Z23 Encounter for immunization: Secondary | ICD-10-CM

## 2017-07-13 DIAGNOSIS — Z1211 Encounter for screening for malignant neoplasm of colon: Secondary | ICD-10-CM

## 2017-07-13 DIAGNOSIS — R030 Elevated blood-pressure reading, without diagnosis of hypertension: Secondary | ICD-10-CM | POA: Diagnosis not present

## 2017-07-13 DIAGNOSIS — E559 Vitamin D deficiency, unspecified: Secondary | ICD-10-CM | POA: Diagnosis not present

## 2017-07-13 DIAGNOSIS — Z136 Encounter for screening for cardiovascular disorders: Secondary | ICD-10-CM

## 2017-07-13 DIAGNOSIS — R7309 Other abnormal glucose: Secondary | ICD-10-CM

## 2017-07-13 DIAGNOSIS — N390 Urinary tract infection, site not specified: Secondary | ICD-10-CM

## 2017-07-13 DIAGNOSIS — Z0000 Encounter for general adult medical examination without abnormal findings: Secondary | ICD-10-CM

## 2017-07-13 DIAGNOSIS — Z0001 Encounter for general adult medical examination with abnormal findings: Secondary | ICD-10-CM

## 2017-07-13 DIAGNOSIS — R5383 Other fatigue: Secondary | ICD-10-CM

## 2017-07-13 DIAGNOSIS — Z79899 Other long term (current) drug therapy: Secondary | ICD-10-CM

## 2017-07-13 DIAGNOSIS — Z1212 Encounter for screening for malignant neoplasm of rectum: Secondary | ICD-10-CM

## 2017-07-13 NOTE — Patient Instructions (Signed)

## 2017-07-13 NOTE — Progress Notes (Signed)
[facility] Unk Pinto, M.D.     Uvaldo Bristle. Silverio Lay, P.A.-C Liane Comber, [place] [address] [place], [place] [address] Telephone [phone] Telefax [phone] Annual Screening/Preventative Visit & Comprehensive Evaluation &  Examination     This very nice 51 y.o. MWF presents for a Screening/Preventative Visit & comprehensive evaluation and management of multiple medical co-morbidities.  Patient has been followed expectantly monitoring for for HTN, Prediabetes, Hyperlipidemia and Vitamin D Deficiency. Patient is still dealing with depression related to the loss of a 69 yo brother 1&1/2 yr ago and loss of a job. Fortunately she found another job. a[facility] and has been Rx'd [place] and she's concerned re: the cost of the medication.      Patient is followed expectantly for labile HTN. Patient's BP has been controlled at home and patient denies any cardiac symptoms as chest pain, palpitations, shortness of breath, dizziness or ankle swelling. Today's BP: 98/64      Patient's hyperlipidemia is controlled with diet. Last lipids were at goal> Lab Results  Component Value Date   CHOL 186 04/06/2016   HDL 103 04/06/2016   LDLCALC 72 04/06/2016   TRIG 55 04/06/2016   CHOLHDL 1.8 04/06/2016      Patient is monitored expectantly for prediabetes  and patient denies reactive hypoglycemic symptoms, visual blurring, diabetic polys, or paresthesias. Last A1c was at goal: Lab Results  Component Value Date   HGBA1C 5.2 04/06/2016      Finally, patient has history of Vitamin D Deficiency ("39" / 2014 and "85" / 2016) and last Vitamin D was still low: Lab Results  Component Value Date   VD25OH 39 04/06/2016   Current Outpatient Prescriptions on File Prior to Visit  Medication Sig  . fluocinonide cream (LIDEX) 0.05 %   . LATUDA 20 MG TABS tablet Take 20 mg by mouth at  bedtime.   No current facility-administered medications on file prior to visit.    No Known Allergies   No past medical history on file. Health Maintenance  Topic Date Due  . PAP SMEAR  05/13/1987  . COLONOSCOPY  05/12/2016  . MAMMOGRAM  04/23/2017  . INFLUENZA VACCINE  06/14/2018 (Originally 04/14/2017)  . TETANUS/TDAP  09/14/2020  . HIV Screening  Completed   Immunization History  Administered Date(s) Administered  . Influenza Inj Mdck Quad With Preservative 07/13/2017  . Tdap 09/14/2010   No past surgical history on file. No family history on file. Social History  Substance Use Topics  . Smoking status: Never Smoker  . Smokeless tobacco: Not on file  . Alcohol use Yes     Comment: wine 3-4 glasses per week    ROS Constitutional: Denies fever, chills, weight loss/gain, headaches, insomnia,  night sweats, and change in appetite. Does c/o fatigue. Eyes: Denies redness, blurred vision, diplopia, discharge, itchy, watery eyes.  ENT: Denies discharge, congestion, post nasal drip, epistaxis, sore throat, earache, hearing loss, dental pain, Tinnitus, Vertigo, Sinus pain, snoring.  Cardio: Denies chest pain, palpitations, irregular heartbeat, syncope, dyspnea, diaphoresis, orthopnea, PND, claudication, edema Respiratory: denies cough, dyspnea, DOE, pleurisy, hoarseness, laryngitis, wheezing.  Gastrointestinal: Denies dysphagia, heartburn, reflux, water brash, pain, cramps, nausea, vomiting, bloating, diarrhea, constipation, hematemesis, melena, hematochezia, jaundice, hemorrhoids Genitourinary: Denies dysuria, frequency, urgency, nocturia, hesitancy, discharge, hematuria, flank pain Breast: Breast lumps, nipple discharge, bleeding.  Musculoskeletal: Denies arthralgia, myalgia, stiffness, Jt. Swelling, pain, limp, and strain/sprain. Denies falls. Skin: Denies puritis,  rash, hives, warts, acne, eczema, changing in skin lesion Neuro: No weakness, tremor, incoordination, spasms,  paresthesia, pain Psychiatric: Denies confusion, memory loss, sensory loss. Denies Depression. Endocrine: Denies change in weight, skin, hair change, nocturia, and paresthesia, diabetic polys, visual blurring, hyper / hypo glycemic episodes.  Heme/Lymph: No excessive bleeding, bruising, enlarged lymph nodes.  Physical Exam  BP 98/64   Pulse 76   Temp 97.9 F (36.6 C)   Resp 16   Ht 5' 8.25" (1.734 m)   Wt 164 lb 6.4 oz (74.6 kg)   BMI 24.81 kg/m   General Appearance: Well nourished, well groomed and in no apparent distress.  Eyes: PERRLA, EOMs, conjunctiva no swelling or erythema, normal fundi and vessels. Sinuses: No frontal/maxillary tenderness ENT/Mouth: EACs patent / TMs  nl. Nares clear without erythema, swelling, mucoid exudates. Oral hygiene is good. No erythema, swelling, or exudate. Tongue normal, non-obstructing. Tonsils not swollen or erythematous. Hearing normal.  Neck: Supple, thyroid normal. No bruits, nodes or JVD. Respiratory: Respiratory effort normal.  BS equal and clear bilateral without rales, rhonci, wheezing or stridor. Cardio: Heart sounds are normal with regular rate and rhythm and no murmurs, rubs or gallops. Peripheral pulses are normal and equal bilaterally without edema. No aortic or femoral bruits. Chest: symmetric with normal excursions and percussion. Breasts: Symmetric, without lumps, nipple discharge, retractions, or fibrocystic changes.  Abdomen: Flat, soft with bowel sounds active. Nontender, no guarding, rebound, hernias, masses, or organomegaly.  Lymphatics: Non tender without lymphadenopathy.  Genitourinary:  Musculoskeletal: Full ROM all peripheral extremities, joint stability, 5/5 strength, and normal gait. Skin: Warm and dry without rashes, lesions, cyanosis, clubbing or  ecchymosis.  Neuro: Cranial nerves intact, reflexes equal bilaterally. Normal muscle tone, no cerebellar symptoms. Sensation intact.  Pysch: Alert and oriented X 3, normal  affect, Insight and Judgment appropriate.   Assessment and Plan  1. Annual Preventative Screening Examination  1. Encounter for general adult medical examination with abnormal findings   2. Elevated BP without diagnosis of hypertension  - EKG 12-Lead - Urinalysis, Routine w reflex microscopic - Microalbumin / creatinine urine ratio - CBC with Differential/Platelet - BASIC METABOLIC PANEL WITH GFR - Magnesium - TSH  3. Screening cholesterol level  - EKG 12-Lead - Hepatic function panel - Lipid panel - TSH  4. Other abnormal glucose  - Hemoglobin A1c - Insulin, random  5. Vitamin D deficiency  - VITAMIN D 25 Hydroxy (Vit-D Deficiency, Fractures)  6. Screening for ischemic heart disease  - EKG 12-Lead  7. Encounter for colorectal cancer screening  - POC Hemoccult Bld/Stl (3-Cd Home Screen); Future  8. Fatigue, unspecified type  - Iron,Total/Total Iron Binding Cap - Vitamin B12 - CBC with Differential/Platelet  9. Medication management  - Urinalysis, Routine w reflex microscopic - Microalbumin / creatinine urine ratio - CBC with Differential/Platelet - BASIC METABOLIC PANEL WITH GFR - Hepatic function panel - Magnesium - Lipid panel - TSH - Hemoglobin A1c - Insulin, random - VITAMIN D 25 Hydroxy (Vit-D Deficiency, Fractures)  10. Need for immunization against influenza  - FLU VACCINE MDCK QUAD W/Preservative      Patient was counseled in prudent diet to achieve/maintain BMI less than 25 for weight control, BP monitoring, regular exercise and medications. Discussed med's effects and SE's. Screening labs and tests as requested with regular follow-up as recommended. Over 40 minutes of exam, counseling, chart review and high complex critical decision making was performed.

## 2017-07-14 ENCOUNTER — Other Ambulatory Visit: Payer: Self-pay | Admitting: Internal Medicine

## 2017-07-14 DIAGNOSIS — N390 Urinary tract infection, site not specified: Secondary | ICD-10-CM

## 2017-07-14 LAB — IRON, TOTAL/TOTAL IRON BINDING CAP
%SAT: 30 % (ref 11–50)
Iron: 105 ug/dL (ref 45–160)
TIBC: 353 ug/dL (ref 250–450)

## 2017-07-14 LAB — BASIC METABOLIC PANEL WITH GFR
BUN: 18 mg/dL (ref 7–25)
CALCIUM: 9.8 mg/dL (ref 8.6–10.4)
CHLORIDE: 103 mmol/L (ref 98–110)
CO2: 31 mmol/L (ref 20–32)
Creat: 0.81 mg/dL (ref 0.50–1.05)
GFR, EST AFRICAN AMERICAN: 97 mL/min/{1.73_m2} (ref >=60)
GFR, Est Non African American: 84 mL/min/{1.73_m2} (ref >=60)
Glucose, Bld: 111 mg/dL — ABNORMAL HIGH (ref 65–99)
POTASSIUM: 4 mmol/L (ref 3.5–5.3)
SODIUM: 140 mmol/L (ref 135–146)

## 2017-07-14 LAB — URINALYSIS, ROUTINE W REFLEX MICROSCOPIC
Bilirubin Urine: NEGATIVE
GLUCOSE, UA: NEGATIVE
HGB URINE DIPSTICK: NEGATIVE
HYALINE CAST: NONE SEEN /LPF
Ketones, ur: NEGATIVE
Nitrite: NEGATIVE
PH: 5.5 (ref 5.0–8.0)
Protein, ur: NEGATIVE
SPECIFIC GRAVITY, URINE: 1.023 (ref 1.001–1.03)

## 2017-07-14 LAB — MICROALBUMIN / CREATININE URINE RATIO
Creatinine, Urine: 103 mg/dL (ref 20–275)
MICROALB/CREAT RATIO: 4 ug/mg{creat} (ref <30)
Microalb, Ur: 0.4 mg/dL

## 2017-07-14 LAB — HEPATIC FUNCTION PANEL
AG Ratio: 1.5 (calc) (ref 1.0–2.5)
ALKALINE PHOSPHATASE (APISO): 66 U/L (ref 33–130)
ALT: 12 U/L (ref 6–29)
AST: 15 U/L (ref 10–35)
Albumin: 4.5 g/dL (ref 3.6–5.1)
BILIRUBIN DIRECT: 0.1 mg/dL (ref 0.0–0.2)
BILIRUBIN INDIRECT: 0.5 mg/dL (ref 0.2–1.2)
Globulin: 3 g/dL (calc) (ref 1.9–3.7)
Total Bilirubin: 0.6 mg/dL (ref 0.2–1.2)
Total Protein: 7.5 g/dL (ref 6.1–8.1)

## 2017-07-14 LAB — CBC WITH DIFFERENTIAL/PLATELET
BASOS PCT: 1.2 %
Basophils Absolute: 79 cells/uL (ref 0–200)
EOS PCT: 5.2 %
Eosinophils Absolute: 343 cells/uL (ref 15–500)
HCT: 38.8 % (ref 35.0–45.0)
Hemoglobin: 13.4 g/dL (ref 11.7–15.5)
Lymphs Abs: 2310 cells/uL (ref 850–3900)
MCH: 29.9 pg (ref 27.0–33.0)
MCHC: 34.5 g/dL (ref 32.0–36.0)
MCV: 86.6 fL (ref 80.0–100.0)
MONOS PCT: 7.9 %
MPV: 10.5 fL (ref 7.5–12.5)
Neutro Abs: 3346 cells/uL (ref 1500–7800)
Neutrophils Relative %: 50.7 %
PLATELETS: 244 10*3/uL (ref 140–400)
RBC: 4.48 10*6/uL (ref 3.80–5.10)
RDW: 11.9 % (ref 11.0–15.0)
TOTAL LYMPHOCYTE: 35 %
WBC: 6.6 10*3/uL (ref 3.8–10.8)
WBCMIX: 521 {cells}/uL (ref 200–950)

## 2017-07-14 LAB — HEMOGLOBIN A1C
HEMOGLOBIN A1C: 5.2 %{Hb} (ref <5.7)
MEAN PLASMA GLUCOSE: 103 (calc)
eAG (mmol/L): 5.7 (calc)

## 2017-07-14 LAB — LIPID PANEL
Cholesterol: 194 mg/dL (ref <200)
HDL: 90 mg/dL (ref >50)
LDL Cholesterol (Calc): 90 mg/dL (calc)
NON-HDL CHOLESTEROL (CALC): 104 mg/dL (ref <130)
Total CHOL/HDL Ratio: 2.2 (calc) (ref <5.0)
Triglycerides: 57 mg/dL (ref <150)

## 2017-07-14 LAB — TSH: TSH: 1.15 m[IU]/L

## 2017-07-14 LAB — MAGNESIUM: Magnesium: 2 mg/dL (ref 1.5–2.5)

## 2017-07-14 LAB — VITAMIN D 25 HYDROXY (VIT D DEFICIENCY, FRACTURES): VIT D 25 HYDROXY: 53 ng/mL (ref 30–100)

## 2017-07-14 LAB — VITAMIN B12: VITAMIN B 12: 474 pg/mL (ref 200–1100)

## 2017-07-14 LAB — INSULIN, RANDOM: INSULIN: 43 u[IU]/mL — AB (ref 2.0–19.6)

## 2017-07-14 NOTE — Addendum Note (Signed)
Addended by: Eulis Canner on: 07/14/2017 01:17 PM   Modules accepted: Orders

## 2017-07-14 NOTE — Progress Notes (Unsigned)
[facility] Unk Pinto, M.D.     Uvaldo Bristle. Silverio Lay, P.A.-C Liane Comber, [place] [address] [place], [place] [address] Telephone [phone] Telefax [phone] Annual Screening/Preventative Visit & Comprehensive Evaluation &  Examination     This very nice 51 y.o. [facility] presents for a Screening/Preventative Visit & comprehensive evaluation and management of multiple medical co-morbidities.  Patient has been followed for HTN, T2_NIDDM  , Hyperlipidemia and Vitamin D Deficiency.      HTN predates since     . Patient's BP has been controlled at home and patient denies any cardiac symptoms as chest pain, palpitations, shortness of breath, dizziness or ankle swelling. Today's        Patient's hyperlipidemia is controlled with diet and medications. Patient denies myalgias or other medication SE's. Last lipids were at goal: Lab Results  Component Value Date   CHOL 194 07/13/2017   HDL 90 07/13/2017   LDLCALC 72 04/06/2016   TRIG 57 07/13/2017   CHOLHDL 2.2 07/13/2017      Patient has T2_NIDDM predating since      and patient denies reactive hypoglycemic symptoms, visual blurring, diabetic polys, or paresthesias. Last A1c was at goal: Lab Results  Component Value Date   HGBA1C 5.2 07/13/2017      Finally, patient has history of Vitamin D Deficiency and last Vitamin D was near goal (70-100): Lab Results  Component Value Date   VD25OH 53 07/13/2017   Current Outpatient Prescriptions on File Prior to Visit  Medication Sig  . fluocinonide cream (LIDEX) 0.05 %   . LATUDA 20 MG TABS tablet Take 20 mg by mouth at bedtime.   No current facility-administered medications on file prior to visit.    No Known Allergies No past medical history on file. Health Maintenance  Topic Date Due  . PAP SMEAR  05/13/1987  . COLONOSCOPY  05/12/2016  . MAMMOGRAM  04/23/2017  . TETANUS/TDAP  09/14/2020  . INFLUENZA  VACCINE  Completed  . HIV Screening  Completed   Immunization History  Administered Date(s) Administered  . Influenza Inj Mdck Quad With Preservative 07/13/2017  . Tdap 09/14/2010   No past surgical history on file. No family history on file. Social History  Substance Use Topics  . Smoking status: Never Smoker  . Smokeless tobacco: Never Used  . Alcohol use Yes     Comment: wine 3-4 glasses per week    ROS Constitutional: Denies fever, chills, weight loss/gain, headaches, insomnia,  night sweats, and change in appetite. Does c/o fatigue. Eyes: Denies redness, blurred vision, diplopia, discharge, itchy, watery eyes.  ENT: Denies discharge, congestion, post nasal drip, epistaxis, sore throat, earache, hearing loss, dental pain, Tinnitus, Vertigo, Sinus pain, snoring.  Cardio: Denies chest pain, palpitations, irregular heartbeat, syncope, dyspnea, diaphoresis, orthopnea, PND, claudication, edema Respiratory: denies cough, dyspnea, DOE, pleurisy, hoarseness, laryngitis, wheezing.  Gastrointestinal: Denies dysphagia, heartburn, reflux, water brash, pain, cramps, nausea, vomiting, bloating, diarrhea, constipation, hematemesis, melena, hematochezia, jaundice, hemorrhoids Genitourinary: Denies dysuria, frequency, urgency, nocturia, hesitancy, discharge, hematuria, flank pain Breast: Breast lumps, nipple discharge, bleeding.  Musculoskeletal: Denies arthralgia, myalgia, stiffness, Jt. Swelling, pain, limp, and strain/sprain. Denies falls. Skin: Denies puritis, rash, hives, warts, acne, eczema, changing in skin lesion Neuro: No weakness, tremor, incoordination, spasms, paresthesia, pain Psychiatric: Denies confusion, memory loss, sensory loss. Denies Depression. Endocrine: Denies change in weight, skin, hair change, nocturia, and paresthesia, diabetic polys, visual blurring, hyper / hypo glycemic episodes.  Heme/Lymph: No  excessive bleeding, bruising, enlarged lymph nodes.  Physical  Exam  There were no vitals taken for this visit.  General Appearance: Well nourished, well groomed and in no apparent distress.  Eyes: PERRLA, EOMs, conjunctiva no swelling or erythema, normal fundi and vessels. Sinuses: No frontal/maxillary tenderness ENT/Mouth: EACs patent / TMs  nl. Nares clear without erythema, swelling, mucoid exudates. Oral hygiene is good. No erythema, swelling, or exudate. Tongue normal, non-obstructing. Tonsils not swollen or erythematous. Hearing normal.  Neck: Supple, thyroid normal. No bruits, nodes or JVD. Respiratory: Respiratory effort normal.  BS equal and clear bilateral without rales, rhonci, wheezing or stridor. Cardio: Heart sounds are normal with regular rate and rhythm and no murmurs, rubs or gallops. Peripheral pulses are normal and equal bilaterally without edema. No aortic or femoral bruits. Chest: symmetric with normal excursions and percussion. Breasts: Symmetric, without lumps, nipple discharge, retractions, or fibrocystic changes.  Abdomen: Flat, soft with bowel sounds active. Nontender, no guarding, rebound, hernias, masses, or organomegaly.  Lymphatics: Non tender without lymphadenopathy.  Genitourinary:  Musculoskeletal: Full ROM all peripheral extremities, joint stability, 5/5 strength, and normal gait. Skin: Warm and dry without rashes, lesions, cyanosis, clubbing or  ecchymosis.  Neuro: Cranial nerves intact, reflexes equal bilaterally. Normal muscle tone, no cerebellar symptoms. Sensation intact.  Pysch: Alert and oriented X 3, normal affect, Insight and Judgment appropriate.   Assessment and Plan  1. Annual Preventative Screening Examination       Patient was counseled in prudent diet to achieve/maintain BMI less than 25 for weight control, BP monitoring, regular exercise and medications. Discussed med's effects and SE's. Screening labs and tests as requested with regular follow-up as recommended. Over 40 minutes of exam, counseling,  chart review and high complex critical decision making was performed.

## 2017-07-15 LAB — URINE CULTURE
MICRO NUMBER: 81222041
Result:: NO GROWTH
SPECIMEN QUALITY: ADEQUATE

## 2017-08-23 ENCOUNTER — Other Ambulatory Visit: Payer: Self-pay | Admitting: Obstetrics & Gynecology

## 2017-09-24 DIAGNOSIS — N760 Acute vaginitis: Secondary | ICD-10-CM | POA: Diagnosis not present

## 2017-09-24 DIAGNOSIS — B373 Candidiasis of vulva and vagina: Secondary | ICD-10-CM | POA: Diagnosis not present

## 2017-10-22 DIAGNOSIS — N760 Acute vaginitis: Secondary | ICD-10-CM | POA: Diagnosis not present

## 2017-11-17 DIAGNOSIS — B373 Candidiasis of vulva and vagina: Secondary | ICD-10-CM | POA: Diagnosis not present

## 2017-11-26 ENCOUNTER — Other Ambulatory Visit: Payer: Self-pay | Admitting: Obstetrics & Gynecology

## 2017-11-30 ENCOUNTER — Other Ambulatory Visit: Payer: Self-pay | Admitting: Obstetrics & Gynecology

## 2017-12-14 DIAGNOSIS — F1020 Alcohol dependence, uncomplicated: Secondary | ICD-10-CM | POA: Diagnosis not present

## 2017-12-14 DIAGNOSIS — F331 Major depressive disorder, recurrent, moderate: Secondary | ICD-10-CM | POA: Diagnosis not present

## 2017-12-14 DIAGNOSIS — F3130 Bipolar disorder, current episode depressed, mild or moderate severity, unspecified: Secondary | ICD-10-CM | POA: Diagnosis not present

## 2017-12-14 DIAGNOSIS — F419 Anxiety disorder, unspecified: Secondary | ICD-10-CM | POA: Diagnosis not present

## 2017-12-28 ENCOUNTER — Other Ambulatory Visit: Payer: Self-pay | Admitting: Obstetrics & Gynecology

## 2018-04-14 DIAGNOSIS — L738 Other specified follicular disorders: Secondary | ICD-10-CM | POA: Diagnosis not present

## 2018-04-14 DIAGNOSIS — L2084 Intrinsic (allergic) eczema: Secondary | ICD-10-CM | POA: Diagnosis not present

## 2018-04-14 DIAGNOSIS — L821 Other seborrheic keratosis: Secondary | ICD-10-CM | POA: Diagnosis not present

## 2018-04-14 DIAGNOSIS — Z808 Family history of malignant neoplasm of other organs or systems: Secondary | ICD-10-CM | POA: Diagnosis not present

## 2018-04-14 DIAGNOSIS — D485 Neoplasm of uncertain behavior of skin: Secondary | ICD-10-CM | POA: Diagnosis not present

## 2018-04-14 DIAGNOSIS — D1800 Hemangioma unspecified site: Secondary | ICD-10-CM | POA: Diagnosis not present

## 2018-06-17 ENCOUNTER — Encounter: Payer: Self-pay | Admitting: Internal Medicine

## 2018-07-20 ENCOUNTER — Encounter: Payer: Self-pay | Admitting: Internal Medicine

## 2018-07-28 DIAGNOSIS — F419 Anxiety disorder, unspecified: Secondary | ICD-10-CM | POA: Diagnosis not present

## 2018-07-28 DIAGNOSIS — F331 Major depressive disorder, recurrent, moderate: Secondary | ICD-10-CM | POA: Diagnosis not present

## 2018-07-28 DIAGNOSIS — F1020 Alcohol dependence, uncomplicated: Secondary | ICD-10-CM | POA: Diagnosis not present

## 2018-07-28 DIAGNOSIS — F3130 Bipolar disorder, current episode depressed, mild or moderate severity, unspecified: Secondary | ICD-10-CM | POA: Diagnosis not present

## 2018-08-03 ENCOUNTER — Encounter: Payer: Self-pay | Admitting: Internal Medicine

## 2018-08-03 NOTE — Patient Instructions (Signed)

## 2018-08-03 NOTE — Progress Notes (Signed)
[facility] Unk Pinto, M.D.     Uvaldo Bristle. Silverio Lay, P.A.-C Liane Comber, [place] [address] [place], [place] [address] Telephone [phone] Telefax [phone] Annual Screening/Preventative Visit & Comprehensive Evaluation &  Examination     This very nice 52 y.o. MWF  presents for a Screening /Preventative Visit & comprehensive evaluation and management of multiple medical co-morbidities.  Patient has been followed expectantly for elevated BP, lipids, abnormal glucose & for and Vitamin D Deficiency.      Last year, patient related experiencing a grief reaction related to the untimely death of her 28 yo brother & loss of her job and was going to [employer] had been prescribed Latuda. Datients sx's of mania or hypomania and decries dx/o bipolar.         Patient is monitored expectantly for labile elevated BP. Patient's BP has been controlled at home and patient denies any cardiac symptoms as chest pain, palpitations, shortness of breath, dizziness or ankle swelling. Today's BP is at goal - 102/66.      Patient's lipids have been controlled with diet.  Last lipids were at goal: Lab Results  Component Value Date   CHOL 194 07/13/2017   HDL 90 07/13/2017   LDLCALC 90 07/13/2017   TRIG 57 07/13/2017   CHOLHDL 2.2 07/13/2017      Patient has hx/o abnormal glucose and is monitored expectantly for  prediabetes and patient denies reactive hypoglycemic symptoms, visual blurring, diabetic polys or paresthesias. Last A1c was Normal & at goal: Lab Results  Component Value Date   HGBA1C 5.2 07/13/2017      Finally, patient has history of Vitamin D Deficiency ("39" / 2014 & "55" / 2016)  and last Vitamin D was near goal (70-100): Lab Results  Component Value Date   VD25OH 53 07/13/2017   Current Outpatient Medications on File Prior to Visit  Medication Sig  . fluocinonide cream (LIDEX) 0.05  %   . LATUDA 20 MG TABS tablet TAKE 1 TABLET BY MOUTH AT BEDTIME   No current facility-administered medications on file prior to visit.    No Known Allergies   Health Maintenance  Topic Date Due  . PAP SMEAR  05/13/1987  . COLONOSCOPY  05/12/2016  . MAMMOGRAM  04/23/2017  . INFLUENZA VACCINE  04/14/2018  . TETANUS/TDAP  09/14/2020  . HIV Screening  Completed   Immunization History  Administered Date(s) Administered  . Influenza Inj Mdck Quad With Preservative 07/13/2017  . Tdap 09/14/2010   Last Colon - 12 yrs ago - age 81 (Dx Crohn's - patient denies sx's and declined treatment)  Last MGM  & Pap - June - Dr [name] -   History reviewed. No pertinent surgical history.   Social History   Tobacco Use  . Smoking status: Never Smoker  . Smokeless tobacco: Never Used  Substance Use Topics  . Alcohol use: Yes    Comment: wine 3-4 glasses per week  . Drug use: Not on file    ROS Constitutional: Denies fever, chills, weight loss/gain, headaches, insomnia,  night sweats, and change in appetite. Does c/o fatigue. Eyes: Denies redness, blurred vision, diplopia, discharge, itchy, watery eyes.  ENT: Denies discharge, congestion, post nasal drip, epistaxis, sore throat, earache, hearing loss, dental pain, Tinnitus, Vertigo, Sinus pain, snoring.  Cardio: Denies chest pain, palpitations, irregular heartbeat, syncope, dyspnea, diaphoresis, orthopnea, PND, claudication, edema Respiratory: denies cough, dyspnea, DOE, pleurisy, hoarseness, laryngitis, wheezing.  Gastrointestinal:  Denies dysphagia, heartburn, reflux, water brash, pain, cramps, nausea, vomiting, bloating, diarrhea, constipation, hematemesis, melena, hematochezia, jaundice, hemorrhoids Genitourinary: Denies dysuria, frequency, urgency, nocturia, hesitancy, discharge, hematuria, flank pain Breast: Breast lumps, nipple discharge, bleeding.  Musculoskeletal: Denies arthralgia, myalgia, stiffness, Jt. Swelling, pain, limp, and  strain/sprain. Denies falls. Skin: Denies puritis, rash, hives, warts, acne, eczema, changing in skin lesion Neuro: No weakness, tremor, incoordination, spasms, paresthesia, pain Psychiatric: Denies confusion, memory loss, sensory loss. Denies Depression. Endocrine: Denies change in weight, skin, hair change, nocturia, and paresthesia, diabetic polys, visual blurring, hyper / hypo glycemic episodes.  Heme/Lymph: No excessive bleeding, bruising, enlarged lymph nodes.  Physical Exam  BP 102/66   Pulse 72   Temp 97.7 F (36.5 C)   Resp 16   Ht 5' 8.25" (1.734 m)   Wt 143 lb 9.6 oz (65.1 kg)   BMI 21.67 kg/m   General Appearance: Well nourished, well groomed and in no apparent distress.  Eyes: PERRLA, EOMs, conjunctiva no swelling or erythema, normal fundi and vessels. Sinuses: No frontal/maxillary tenderness ENT/Mouth: EACs patent / TMs  nl. Nares clear without erythema, swelling, mucoid exudates. Oral hygiene is good. No erythema, swelling, or exudate. Tongue normal, non-obstructing. Tonsils not swollen or erythematous. Hearing normal.  Neck: Supple, thyroid not palpable. No bruits, nodes or JVD. Respiratory: Respiratory effort normal.  BS equal and clear bilateral without rales, rhonci, wheezing or stridor. Cardio: Heart sounds are normal with regular rate and rhythm and no murmurs, rubs or gallops. Peripheral pulses are normal and equal bilaterally without edema. No aortic or femoral bruits. Chest: symmetric with normal excursions and percussion. Breasts: Symmetric, without lumps, nipple discharge, retractions, or fibrocystic changes.  Abdomen: Flat, soft with bowel sounds active. Nontender, no guarding, rebound, hernias, masses, or organomegaly.  Lymphatics: Non tender without lymphadenopathy.  Musculoskeletal: Full ROM all peripheral extremities, joint stability, 5/5 strength, and normal gait. Skin: Warm and dry without rashes, lesions, cyanosis, clubbing or  ecchymosis.  Neuro:  Cranial nerves intact, reflexes equal bilaterally. Normal muscle tone, no cerebellar symptoms. Sensation intact.  Pysch: Alert and oriented X 3, normal affect, Insight and Judgment appropriate.   Assessment and Plan  1. Annual Preventative Screening Examination  2. Elevated BP without diagnosis of hypertension  - EKG 12-Lead - [place], RETROPERITNL ABD,  LTD - Urinalysis, Routine w reflex microscopic - Microalbumin / creatinine urine ratio - CBC with Differential/Platelet - COMPLETE METABOLIC PANEL WITH GFR - Magnesium - TSH  3. Screening cholesterol level  - EKG 12-Lead - [place], RETROPERITNL ABD,  LTD - Lipid panel - TSH  4. Abnormal glucose  - EKG 12-Lead - [place], RETROPERITNL ABD,  LTD - Hemoglobin A1c - Insulin, random  5. Vitamin D deficiency  - VITAMIN D 25 Hydroxy  6. Encounter for colorectal cancer screening  - POC Hemoccult Bld/Stl   7. Screening for ischemic heart disease  - EKG 12-Lead  8. Screening for AAA (aortic abdominal aneurysm)  - [place], RETROPERITNL ABD,  LTD  9. Fatigue, unspecified type  - Iron,Total/Total Iron Binding Cap - Vitamin B12 - CBC with Differential/Platelet - TSH  10. Medication management  - Urinalysis, Routine w reflex microscopic - Microalbumin / creatinine urine ratio - CBC with Differential/Platelet - COMPLETE METABOLIC PANEL WITH GFR - Magnesium - Lipid panel - TSH - Hemoglobin A1c - Insulin, random - VITAMIN D 25 Hydroxyl            Patient was counseled in prudent diet to achieve/maintain BMI less than 25 for weight  control, BP monitoring, regular exercise and medications. Discussed med's effects and SE's. Screening labs and tests as requested with regular follow-up as recommended. Over 40 minutes of exam, counseling, chart review and high complex critical decision making was performed.

## 2018-08-04 ENCOUNTER — Ambulatory Visit: Payer: BLUE CROSS/BLUE SHIELD | Admitting: Internal Medicine

## 2018-08-04 ENCOUNTER — Encounter: Payer: Self-pay | Admitting: Internal Medicine

## 2018-08-04 VITALS — BP 102/66 | HR 72 | Temp 97.7°F | Resp 16 | Ht 68.25 in | Wt 143.6 lb

## 2018-08-04 DIAGNOSIS — Z23 Encounter for immunization: Secondary | ICD-10-CM | POA: Diagnosis not present

## 2018-08-04 DIAGNOSIS — R030 Elevated blood-pressure reading, without diagnosis of hypertension: Secondary | ICD-10-CM | POA: Diagnosis not present

## 2018-08-04 DIAGNOSIS — Z136 Encounter for screening for cardiovascular disorders: Secondary | ICD-10-CM | POA: Diagnosis not present

## 2018-08-04 DIAGNOSIS — R5383 Other fatigue: Secondary | ICD-10-CM

## 2018-08-04 DIAGNOSIS — Z13220 Encounter for screening for lipoid disorders: Secondary | ICD-10-CM

## 2018-08-04 DIAGNOSIS — E559 Vitamin D deficiency, unspecified: Secondary | ICD-10-CM | POA: Diagnosis not present

## 2018-08-04 DIAGNOSIS — Z131 Encounter for screening for diabetes mellitus: Secondary | ICD-10-CM | POA: Diagnosis not present

## 2018-08-04 DIAGNOSIS — Z79899 Other long term (current) drug therapy: Secondary | ICD-10-CM

## 2018-08-04 DIAGNOSIS — Z1329 Encounter for screening for other suspected endocrine disorder: Secondary | ICD-10-CM | POA: Diagnosis not present

## 2018-08-04 DIAGNOSIS — R7309 Other abnormal glucose: Secondary | ICD-10-CM

## 2018-08-04 DIAGNOSIS — Z130 Encounter for screening for diseases of the blood and blood-forming organs and certain disorders involving the immune mechanism: Secondary | ICD-10-CM

## 2018-08-04 DIAGNOSIS — Z1212 Encounter for screening for malignant neoplasm of rectum: Secondary | ICD-10-CM

## 2018-08-04 DIAGNOSIS — Z0000 Encounter for general adult medical examination without abnormal findings: Secondary | ICD-10-CM | POA: Diagnosis not present

## 2018-08-04 DIAGNOSIS — Z1211 Encounter for screening for malignant neoplasm of colon: Secondary | ICD-10-CM

## 2018-08-04 DIAGNOSIS — Z1389 Encounter for screening for other disorder: Secondary | ICD-10-CM | POA: Diagnosis not present

## 2018-08-04 DIAGNOSIS — Z0001 Encounter for general adult medical examination with abnormal findings: Secondary | ICD-10-CM

## 2018-08-05 LAB — CBC WITH DIFFERENTIAL/PLATELET
BASOS ABS: 83 {cells}/uL (ref 0–200)
Basophils Relative: 1.2 %
EOS PCT: 1.3 %
Eosinophils Absolute: 90 cells/uL (ref 15–500)
HCT: 39.7 % (ref 35.0–45.0)
HEMOGLOBIN: 13.7 g/dL (ref 11.7–15.5)
Lymphs Abs: 1877 cells/uL (ref 850–3900)
MCH: 30.4 pg (ref 27.0–33.0)
MCHC: 34.5 g/dL (ref 32.0–36.0)
MCV: 88.2 fL (ref 80.0–100.0)
MONOS PCT: 8.7 %
MPV: 11.1 fL (ref 7.5–12.5)
NEUTROS ABS: 4250 {cells}/uL (ref 1500–7800)
Neutrophils Relative %: 61.6 %
PLATELETS: 239 10*3/uL (ref 140–400)
RBC: 4.5 10*6/uL (ref 3.80–5.10)
RDW: 11.5 % (ref 11.0–15.0)
TOTAL LYMPHOCYTE: 27.2 %
WBC mixed population: 600 cells/uL (ref 200–950)
WBC: 6.9 10*3/uL (ref 3.8–10.8)

## 2018-08-05 LAB — MICROALBUMIN / CREATININE URINE RATIO
Creatinine, Urine: 138 mg/dL (ref 20–275)
MICROALB UR: 1 mg/dL
Microalb Creat Ratio: 7 mcg/mg creat (ref <30)

## 2018-08-05 LAB — HEMOGLOBIN A1C
EAG (MMOL/L): 5.7 (calc)
HEMOGLOBIN A1C: 5.2 %{Hb} (ref <5.7)
MEAN PLASMA GLUCOSE: 103 (calc)

## 2018-08-05 LAB — COMPLETE METABOLIC PANEL WITH GFR
AG RATIO: 1.7 (calc) (ref 1.0–2.5)
ALT: 15 U/L (ref 6–29)
AST: 17 U/L (ref 10–35)
Albumin: 4.5 g/dL (ref 3.6–5.1)
Alkaline phosphatase (APISO): 57 U/L (ref 33–130)
BUN: 20 mg/dL (ref 7–25)
CO2: 29 mmol/L (ref 20–32)
CREATININE: 0.86 mg/dL (ref 0.50–1.05)
Calcium: 9.5 mg/dL (ref 8.6–10.4)
Chloride: 102 mmol/L (ref 98–110)
GFR, Est African American: 90 mL/min/{1.73_m2} (ref >=60)
GFR, Est Non African American: 78 mL/min/{1.73_m2} (ref >=60)
GLOBULIN: 2.6 g/dL (ref 1.9–3.7)
Glucose, Bld: 109 mg/dL — ABNORMAL HIGH (ref 65–99)
POTASSIUM: 4 mmol/L (ref 3.5–5.3)
SODIUM: 137 mmol/L (ref 135–146)
Total Bilirubin: 0.6 mg/dL (ref 0.2–1.2)
Total Protein: 7.1 g/dL (ref 6.1–8.1)

## 2018-08-05 LAB — URINALYSIS, ROUTINE W REFLEX MICROSCOPIC
Bilirubin Urine: NEGATIVE
Glucose, UA: NEGATIVE
Hgb urine dipstick: NEGATIVE
KETONES UR: NEGATIVE
LEUKOCYTES UA: NEGATIVE
NITRITE: NEGATIVE
PH: 5.5 (ref 5.0–8.0)
Protein, ur: NEGATIVE
SPECIFIC GRAVITY, URINE: 1.027 (ref 1.001–1.03)

## 2018-08-05 LAB — VITAMIN D 25 HYDROXY (VIT D DEFICIENCY, FRACTURES): VIT D 25 HYDROXY: 43 ng/mL (ref 30–100)

## 2018-08-05 LAB — LIPID PANEL
CHOLESTEROL: 180 mg/dL (ref <200)
HDL: 81 mg/dL (ref >50)
LDL Cholesterol (Calc): 86 mg/dL (calc)
Non-HDL Cholesterol (Calc): 99 mg/dL (calc) (ref <130)
Total CHOL/HDL Ratio: 2.2 (calc) (ref <5.0)
Triglycerides: 58 mg/dL (ref <150)

## 2018-08-05 LAB — INSULIN, RANDOM: INSULIN: 16 u[IU]/mL (ref 2.0–19.6)

## 2018-08-05 LAB — VITAMIN B12: Vitamin B-12: 465 pg/mL (ref 200–1100)

## 2018-08-05 LAB — IRON, TOTAL/TOTAL IRON BINDING CAP
%SAT: 27 % (ref 16–45)
Iron: 93 ug/dL (ref 45–160)
TIBC: 348 ug/dL (ref 250–450)

## 2018-08-05 LAB — TSH: TSH: 1.87 mIU/L

## 2018-08-05 LAB — MAGNESIUM: MAGNESIUM: 1.9 mg/dL (ref 1.5–2.5)

## 2018-08-06 ENCOUNTER — Encounter: Payer: Self-pay | Admitting: Internal Medicine

## 2018-08-08 ENCOUNTER — Encounter: Payer: Self-pay | Admitting: *Deleted

## 2018-08-23 ENCOUNTER — Encounter: Payer: Self-pay | Admitting: Gastroenterology

## 2018-10-03 ENCOUNTER — Encounter: Payer: 59 | Admitting: Gastroenterology

## 2018-10-03 ENCOUNTER — Encounter: Payer: Self-pay | Admitting: Internal Medicine

## 2018-10-03 NOTE — Progress Notes (Signed)
   Subjective:    Patient ID: Jacqueline Banks, female    DOB: 1966/09/09, 53 y.o.   MRN: [id number]  HPI    This very nice 53 yo MWF presents with c/o fatigue & apathy, lack of motivation and some c/o anxiety. She relates she internalizes her feelings. He had been treated with Latuda recently for reactive depression attributed to the untimely of her brother. After stopping the Latuda, the above feelings surfaced after about a month. She had her annual Preventative exam about 6 weeks ago and all labs were WNL. Denies tearful episodes, swings of mood from high to low and no SI.   Medication Sig  . fluocinonide cream  0.05 %   . LATUDA 20 MG TABS TAKE 1 TABLET  AT BEDTIME (Reported not taking on 10/04/2018)   No Known Allergies   History reviewed. No pertinent past medical history.   Review of Systems    10 point systems review negative except as above.    Objective:   Physical Exam  BP 118/72   Pulse 68   Temp (!) 97.2 F (36.2 C)   Ht 5' 8.5" (1.74 m)   Wt 143 lb 6.4 oz (65 kg)   SpO2 98%   BMI 21.49 kg/m   HEENT - WNL. Neck - supple.  Chest - Clear equal BS. Cor - Nl HS. RRR w/o sig MGR. PP 1(+). No edema. MS- FROM w/o deformities.  Gait Nl. Neuro -  Nl w/o focal abnormalities.    Assessment & Plan:   1. Fatigue, unspecified  - sertraline (ZOLOFT) 50 MG tablet; Take 1 tablet daily for Mood  Dispense: 30 tablet; Refill: 2  2. Insomnia, unspecified   - ALPRAZolam (XANAX) 0.5 MG tablet; Take 1/2  to 1 tablet at hour of Sleep  Dispense: 30 tablet; Refill: 0  - Discussed med & SE's  - ROV - 1 month

## 2018-10-04 ENCOUNTER — Ambulatory Visit (INDEPENDENT_AMBULATORY_CARE_PROVIDER_SITE_OTHER): Payer: BLUE CROSS/BLUE SHIELD | Admitting: Internal Medicine

## 2018-10-04 ENCOUNTER — Encounter: Payer: Self-pay | Admitting: Internal Medicine

## 2018-10-04 VITALS — BP 118/72 | HR 68 | Temp 97.2°F | Ht 68.5 in | Wt 143.4 lb

## 2018-10-04 DIAGNOSIS — G4700 Insomnia, unspecified: Secondary | ICD-10-CM

## 2018-10-04 DIAGNOSIS — R5383 Other fatigue: Secondary | ICD-10-CM | POA: Diagnosis not present

## 2018-10-04 MED ORDER — SERTRALINE HCL 50 MG PO TABS: ORAL_TABLET | ORAL | 2 refills | Status: DC

## 2018-10-04 MED ORDER — ALPRAZOLAM 0.5 MG PO TABS: ORAL_TABLET | ORAL | 0 refills | Status: DC

## 2018-10-04 NOTE — Patient Instructions (Signed)
Persistent Depressive Disorder  Persistent depressive disorder (PDD) is a mental health condition. PDD causes symptoms of low-level depression for 2 years or longer. It may also be called long-term (chronic) depression or dysthymia. PDD may include episodes of more severe depression that last for about 2 weeks (major depressive disorder or MDD). PDD can affect the way you think, feel, and sleep. This condition may also affect your relationships. You may be more likely to get sick if you have PDD. Symptoms of PDD occur for most of the day and may include:  Feeling tired (fatigue).  Low energy.  Eating too much or too little.  Sleeping too much or too little.  Feeling restless or agitated.  Feeling hopeless.  Feeling worthless or guilty.  Feeling worried or nervous (anxiety).  Trouble concentrating or making decisions.  Low self-esteem.  A negative way of looking at things (outlook).  Not being able to have fun or feel pleasure.  Avoiding interacting with people.  Getting angry or annoyed easily (irritability).  Acting aggressive or angry. Follow these instructions at home: Activity  Go back to your normal activities as told by your doctor.  Exercise regularly as told by your doctor. General instructions  Take over-the-counter and prescription medicines only as told by your doctor.  Do not drink alcohol. Or, limit how much alcohol you drink to no more than 1 drink a day for nonpregnant women and 2 drinks a day for men. One drink equals 12 oz of beer, 5 oz of wine, or 1 oz of hard liquor. Alcohol can affect any antidepressant medicines you are taking. Talk with your doctor about your alcohol use.  Eat a healthy diet and get plenty of sleep.  Find activities that you enjoy each day.  Consider joining a support group. Your doctor may be able to suggest a support group.  Keep all follow-up visits as told by your doctor. This is important. Where to find more  information National Alliance on Mental Illness  www.nami.org [place] National Institute of Mental Health  www.nimh.nih.gov National Suicide Prevention Lifeline  (1-800-273-8255).  This is free, 24-hour help. Contact a doctor if:  Your symptoms get worse.  You have new symptoms.  You have trouble sleeping or doing your daily activities. Get help right away if:  You self-harm.  You have serious thoughts about hurting yourself or others.  You see, hear, taste, smell, or feel things that are not there (hallucinate). This information is not intended to replace advice given to you by your health care provider. Make sure you discuss any questions you have with your health care provider. Document Released: 08/12/2015 Document Revised: 04/24/2016 Document Reviewed: 04/24/2016 Elsevier Interactive Patient Education  2019 Elsevier Inc.  

## 2018-11-07 ENCOUNTER — Encounter: Payer: Self-pay | Admitting: Internal Medicine

## 2018-11-07 NOTE — Progress Notes (Signed)
   Subjective:    Patient ID: Jacqueline Banks, female    DOB: 1966/08/10, 53 y.o.   MRN: [id number]  HPI    Patient is a nice 53 yo MWF returning for 1 mo f/u after starting Zoloft for reactive Depression & associated fatigue from poor sleep hygiene. She relates she never filled the Rx for Xanax. She reports feeling less anxious & irritable and altho she feels her depression is better she still reports apathy, no motivation or drive & feels she is not back to normal. She still feels very fatigued.   Medication Sig  . ALPRAZolam (XANAX) 0.5 MG tablet Take 1/2  to 1 tablet at hour of Sleep  . fluocinonide cream (LIDEX) 0.05 %   . sertraline (ZOLOFT) 50 MG tablet Take 1 tablet daily for Mood   No Known Allergies   History reviewed. No pertinent past medical history.  Review of Systems    10 point systems review negative except as above.    Objective:   Physical Exam  BP 104/62   Pulse 68   Temp (!) 97 F (36.1 C)   Resp 16   Ht 5' 8.5" (1.74 m)   Wt 140 lb (63.5 kg)   BMI 20.98 kg/m   HEENT - WNL. Neck - supple.  Chest - Clear Cor - Nl HS. RRR  MS- FROM w/o deformities.  Gait Nl. Neuro -  Nl w/o focal abnormalities Psyche- Affect flat - forces smiles. A & O x3 w/o delusions or SI.     Assessment & Plan:   1. Fatigue, unspecified type  - discussed increasing her Zoloft dose up to 1&1/2 tabs = 75 mg /daily.  - ROV 1 month

## 2018-11-08 ENCOUNTER — Ambulatory Visit: Payer: BLUE CROSS/BLUE SHIELD | Admitting: Internal Medicine

## 2018-11-08 ENCOUNTER — Encounter: Payer: Self-pay | Admitting: Internal Medicine

## 2018-11-08 VITALS — BP 104/62 | HR 68 | Temp 97.0°F | Resp 16 | Ht 68.5 in | Wt 140.0 lb

## 2018-11-08 DIAGNOSIS — R5383 Other fatigue: Secondary | ICD-10-CM | POA: Diagnosis not present

## 2018-11-25 ENCOUNTER — Other Ambulatory Visit: Payer: Self-pay | Admitting: *Deleted

## 2018-11-25 DIAGNOSIS — R5383 Other fatigue: Secondary | ICD-10-CM

## 2018-11-25 MED ORDER — SERTRALINE HCL 50 MG PO TABS: ORAL_TABLET | ORAL | 2 refills | Status: DC

## 2018-12-07 ENCOUNTER — Encounter: Payer: Self-pay | Admitting: Internal Medicine

## 2018-12-19 ENCOUNTER — Encounter: Payer: Self-pay | Admitting: Internal Medicine

## 2018-12-19 NOTE — Progress Notes (Addendum)
Virtual Visit via Telephone Note  I connected with Jacqueline Banks on 12/19/18 at 11:20 AM EDT by telephone and verified that I am speaking with the correct person using two identifiers.   I discussed the limitations, risks, security and privacy concerns of performing an evaluation and management service by telephone and the availability of in person appointments. I also discussed with the patient that there may be a patient responsible charge related to this service. The patient expressed understanding and agreed to proceed.  History of Present Illness:     Patient is a very nice 53 yo MWF with hx/o Depression in part reactive after the untimely death of her brother and had been treated last yearwith Latuda and later self tapered off of it.  After a period of time she again became depressed and in Jan 21, she was started on Sertraline (in lieu of Latuda given that there were no indicators that she was Bipolar & needed an atypical antipsychotic). Then on 2/25, she was seen again and was definitely improved, but still felt some degree of apathy or anhedonia, lack of motivation/drive and fatigued. She was advised to increase her Sertraline 50 mg to 1.5 tab (=75 mg/day).      Patient reports sleeping better and "feeling much better" & "back to Normal". She feels motivated again with a positive attitude and also relates that her husband gives her an A+ for her improvement.    Observations/Objective: General : Well sounding patient in no apparent distress HEENT: no hoarseness, no cough for duration of visit Lungs: speaks in complete sentences, no audible wheezing, no apparent distress Neurological: alert, oriented x 3 Psychiatric: pleasant, judgement appropriate. A & O x3 w/o delusions or SI. Call ended 11:37 Assessment and Plan:  1. Fatigue, unspecified type  - sertraline (ZOLOFT) 50 MG tablet; Take 1 tablet 2 x /day  for Mood  Dispense: 180 tablet; Refill: 1  2. Insomnia, unspecified  type  - sertraline (ZOLOFT) 50 MG tablet; Take 1 tablet 2 x /day  for Mood  Dispense: 180 tablet; Refill: 1  Follow Up Instructions:  I discussed the assessment and treatment plan with the patient. The patient was provided an opportunity to ask questions and all were answered. The patient agreed with the plan and demonstrated an understanding of the instructions. Since patient doing well advise contine Sertraline same at 75 mgdaily and return at next scheduled CPE in Jan 2021. The patient was advised to call back or seek an in-person evaluation if the symptoms worsen or if the condition fails to improve as anticipated.  I provided  17 minutes of non-face-to-face time during this encounter and total 23 minutes of  counseling, chart review and  critical decision making was performed  Kirtland Bouchard, MD

## 2018-12-20 ENCOUNTER — Other Ambulatory Visit: Payer: Self-pay

## 2018-12-20 ENCOUNTER — Ambulatory Visit: Payer: BLUE CROSS/BLUE SHIELD | Admitting: Internal Medicine

## 2018-12-20 ENCOUNTER — Ambulatory Visit: Payer: Self-pay | Admitting: Internal Medicine

## 2018-12-20 VITALS — Wt 143.0 lb

## 2018-12-20 DIAGNOSIS — R5383 Other fatigue: Secondary | ICD-10-CM

## 2018-12-20 DIAGNOSIS — G4700 Insomnia, unspecified: Secondary | ICD-10-CM | POA: Diagnosis not present

## 2018-12-20 MED ORDER — SERTRALINE HCL 50 MG PO TABS: ORAL_TABLET | ORAL | 1 refills | Status: DC

## 2019-03-24 DIAGNOSIS — R109 Unspecified abdominal pain: Secondary | ICD-10-CM | POA: Diagnosis not present

## 2019-03-24 DIAGNOSIS — N840 Polyp of corpus uteri: Secondary | ICD-10-CM | POA: Diagnosis not present

## 2019-03-24 DIAGNOSIS — N760 Acute vaginitis: Secondary | ICD-10-CM | POA: Diagnosis not present

## 2019-03-24 DIAGNOSIS — N841 Polyp of cervix uteri: Secondary | ICD-10-CM | POA: Diagnosis not present

## 2019-04-13 DIAGNOSIS — Z780 Asymptomatic menopausal state: Secondary | ICD-10-CM | POA: Diagnosis not present

## 2019-04-13 DIAGNOSIS — Z1231 Encounter for screening mammogram for malignant neoplasm of breast: Secondary | ICD-10-CM | POA: Diagnosis not present

## 2019-04-13 DIAGNOSIS — Z6821 Body mass index (BMI) 21.0-21.9, adult: Secondary | ICD-10-CM | POA: Diagnosis not present

## 2019-04-13 DIAGNOSIS — N760 Acute vaginitis: Secondary | ICD-10-CM | POA: Diagnosis not present

## 2019-04-13 DIAGNOSIS — N898 Other specified noninflammatory disorders of vagina: Secondary | ICD-10-CM | POA: Diagnosis not present

## 2019-04-13 DIAGNOSIS — Z124 Encounter for screening for malignant neoplasm of cervix: Secondary | ICD-10-CM | POA: Diagnosis not present

## 2019-04-13 DIAGNOSIS — Z01419 Encounter for gynecological examination (general) (routine) without abnormal findings: Secondary | ICD-10-CM | POA: Diagnosis not present

## 2019-04-13 DIAGNOSIS — Z1151 Encounter for screening for human papillomavirus (HPV): Secondary | ICD-10-CM | POA: Diagnosis not present

## 2019-04-13 LAB — HM MAMMOGRAPHY

## 2019-05-02 ENCOUNTER — Encounter: Payer: Self-pay | Admitting: Internal Medicine

## 2019-05-13 ENCOUNTER — Other Ambulatory Visit: Payer: Self-pay | Admitting: Internal Medicine

## 2019-05-13 DIAGNOSIS — R5383 Other fatigue: Secondary | ICD-10-CM

## 2019-05-13 DIAGNOSIS — G4700 Insomnia, unspecified: Secondary | ICD-10-CM

## 2019-08-14 ENCOUNTER — Other Ambulatory Visit: Payer: Self-pay

## 2019-08-14 ENCOUNTER — Ambulatory Visit
Admission: RE | Admit: 2019-08-14 | Discharge: 2019-08-14 | Disposition: A | Discharge status: Home / Self Care | Payer: 59 | Source: Ambulatory Visit | Attending: Physician Assistant | Admitting: Physician Assistant

## 2019-08-14 ENCOUNTER — Ambulatory Visit: Payer: BLUE CROSS/BLUE SHIELD | Admitting: Physician Assistant

## 2019-08-14 ENCOUNTER — Encounter: Payer: Self-pay | Admitting: Physician Assistant

## 2019-08-14 VITALS — BP 110/60 | HR 62 | Temp 97.3°F | Wt 143.0 lb

## 2019-08-14 DIAGNOSIS — R079 Chest pain, unspecified: Secondary | ICD-10-CM | POA: Diagnosis not present

## 2019-08-14 DIAGNOSIS — R0781 Pleurodynia: Secondary | ICD-10-CM | POA: Diagnosis not present

## 2019-08-14 DIAGNOSIS — S299XXA Unspecified injury of thorax, initial encounter: Secondary | ICD-10-CM | POA: Diagnosis not present

## 2019-08-14 NOTE — Patient Instructions (Addendum)
INFORMATION ABOUT YOUR XRAY  Can walk into [address] for an Insurance account manager. They will have the order and take you back. You do not any paper work, I should get the result back today or tomorrow. This order is good for a year.  Can call [phone] to schedule an appointment if you wish.    Rib Fracture  A rib fracture is a break or crack in one of the bones of the ribs. The ribs are long, curved bones that wrap around your chest and attach to your spine and your breastbone. The ribs protect your heart, lungs, and other organs in the chest. A broken or cracked rib is often painful but is not usually serious. Most rib fractures heal on their own over time. However, rib fractures can be more serious if multiple ribs are broken or if broken ribs move out of place and push against other structures or organs. What are the causes? This condition is caused by:  Repetitive movements with high force, such as pitching a baseball or having severe coughing spells.  A direct blow to the chest, such as a sports injury, a car accident, or a fall.  Cancer that has spread to the bones, which can weaken bones and cause them to break. What are the signs or symptoms? Symptoms of this condition include:  Pain when you breathe in or cough.  Pain when someone presses on the injured area.  Feeling short of breath. How is this diagnosed? This condition is diagnosed with a physical exam and medical history. Imaging tests may also be done, such as:  Chest X-ray.  CT scan.  MRI.  Bone scan.  Chest ultrasound. How is this treated? Treatment for this condition depends on the severity of the fracture. Most rib fractures usually heal on their own in 1-3 months. Sometimes healing takes longer if there is a cough that does not stop or if there are other activities that make the injury worse (aggravating factors). While you heal, you will be given medicines to control the pain. You will also be taught deep  breathing exercises. Severe injuries may require hospitalization or surgery. Follow these instructions at home: Managing pain, stiffness, and swelling  If directed, apply ice to the injured area. ? Put ice in a plastic bag. ? Place a towel between your skin and the bag. ? Leave the ice on for 20 minutes, 2-3 times a day.  Take over-the-counter and prescription medicines only as told by your health care provider. Activity  Avoid a lot of activity and any activities or movements that cause pain. Be careful during activities and avoid bumping the injured rib.  Slowly increase your activity as told by your health care provider. General instructions  Do deep breathing exercises as told by your health care provider. This helps prevent pneumonia, which is a common complication of a broken rib. Your health care provider may instruct you to: ? Take deep breaths several times a day. ? Try to cough several times a day, holding a pillow against the injured area. ? Use a device called incentive spirometer to practice deep breathing several times a day.  Drink enough fluid to keep your urine pale yellow.  Do not wear a rib belt or binder. These restrict breathing, which can lead to pneumonia.  Keep all follow-up visits as told by your health care provider. This is important. Contact a health care provider if:  You have a fever. Get help right away if:  You have difficulty breathing or you are short of breath.  You develop a cough that does not stop, or you cough up thick or bloody sputum.  You have nausea, vomiting, or pain in your abdomen.  Your pain gets worse and medicine does not help. Summary  A rib fracture is a break or crack in one of the bones of the ribs.  A broken or cracked rib is often painful but is not usually serious.  Most rib fractures heal on their own over time.  Treatment for this condition depends on the severity of the fracture.  Avoid a lot of activity and  any activities or movements that cause pain. This information is not intended to replace advice given to you by your health care provider. Make sure you discuss any questions you have with your health care provider. Document Released: 08/31/2005 Document Revised: 08/13/2017 Document Reviewed: 11/30/2016 Elsevier Patient Education  [place].
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
## 2019-08-15 DIAGNOSIS — H2513 Age-related nuclear cataract, bilateral: Secondary | ICD-10-CM | POA: Diagnosis not present

## 2019-08-15 DIAGNOSIS — H04123 Dry eye syndrome of bilateral lacrimal glands: Secondary | ICD-10-CM | POA: Diagnosis not present

## 2019-08-15 DIAGNOSIS — H16143 Punctate keratitis, bilateral: Secondary | ICD-10-CM | POA: Diagnosis not present

## 2019-08-15 DIAGNOSIS — H40013 Open angle with borderline findings, low risk, bilateral: Secondary | ICD-10-CM | POA: Diagnosis not present

## 2019-09-19 ENCOUNTER — Other Ambulatory Visit: Payer: Self-pay

## 2019-09-19 ENCOUNTER — Ambulatory Visit: Payer: BC Managed Care – PPO | Admitting: Internal Medicine

## 2019-09-19 ENCOUNTER — Encounter: Payer: Self-pay | Admitting: Internal Medicine

## 2019-09-19 VITALS — BP 112/76 | HR 64 | Temp 97.1°F | Resp 16 | Ht 68.0 in | Wt 145.8 lb

## 2019-09-19 DIAGNOSIS — Z1211 Encounter for screening for malignant neoplasm of colon: Secondary | ICD-10-CM

## 2019-09-19 DIAGNOSIS — R030 Elevated blood-pressure reading, without diagnosis of hypertension: Secondary | ICD-10-CM | POA: Diagnosis not present

## 2019-09-19 DIAGNOSIS — Z79899 Other long term (current) drug therapy: Secondary | ICD-10-CM

## 2019-09-19 DIAGNOSIS — Z1212 Encounter for screening for malignant neoplasm of rectum: Secondary | ICD-10-CM

## 2019-09-19 DIAGNOSIS — Z1329 Encounter for screening for other suspected endocrine disorder: Secondary | ICD-10-CM | POA: Diagnosis not present

## 2019-09-19 DIAGNOSIS — Z131 Encounter for screening for diabetes mellitus: Secondary | ICD-10-CM | POA: Diagnosis not present

## 2019-09-19 DIAGNOSIS — Z130 Encounter for screening for diseases of the blood and blood-forming organs and certain disorders involving the immune mechanism: Secondary | ICD-10-CM | POA: Diagnosis not present

## 2019-09-19 DIAGNOSIS — Z0001 Encounter for general adult medical examination with abnormal findings: Secondary | ICD-10-CM

## 2019-09-19 DIAGNOSIS — R5383 Other fatigue: Secondary | ICD-10-CM

## 2019-09-19 DIAGNOSIS — E559 Vitamin D deficiency, unspecified: Secondary | ICD-10-CM | POA: Diagnosis not present

## 2019-09-19 DIAGNOSIS — Z13220 Encounter for screening for lipoid disorders: Secondary | ICD-10-CM | POA: Diagnosis not present

## 2019-09-19 DIAGNOSIS — Z136 Encounter for screening for cardiovascular disorders: Secondary | ICD-10-CM

## 2019-09-19 DIAGNOSIS — Z0000 Encounter for general adult medical examination without abnormal findings: Secondary | ICD-10-CM | POA: Diagnosis not present

## 2019-09-19 DIAGNOSIS — R7309 Other abnormal glucose: Secondary | ICD-10-CM

## 2019-09-19 DIAGNOSIS — Z1389 Encounter for screening for other disorder: Secondary | ICD-10-CM

## 2019-09-19 NOTE — Progress Notes (Signed)
Annual Screening/Preventative Visit & Comprehensive Evaluation &  Examination     This very nice 54 y.o. MWF presents for a Screening /Preventative Visit & comprehensive evalIDuation and management of multiple medical co-morbidities.  Patient has been followed for HTN, HLD, Prediabetes  and Vitamin D Deficiency.      Patient is followed expectantly for labile elevated BP.  Patient's BP has been controlled at home and patient denies any cardiac symptoms as chest pain, palpitations, shortness of breath, dizziness or ankle swelling. Today's BP is at goal - 112/76.      Patient's hyperlipidemia is controlled with diet and medications. Patient denies myalgias or other medication SE's. Last lipids were at goal:  Lab Results  Component Value Date   CHOL 180 08/04/2018   HDL 81 08/04/2018   LDLCALC 86 08/04/2018   TRIG 58 08/04/2018   CHOLHDL 2.2 08/04/2018       Patient is monitored for glucose intolerance.  and patient denies reactive hypoglycemic symptoms, visual blurring, diabetic polys or paresthesias. Last A1c was Normal & at goal:  Lab Results  Component Value Date   HGBA1C 5.2 08/04/2018       Finally, patient has history of Vitamin D Deficiency ("39" / 2014 & "71" / 2016)  and last Vitamin D was low:  Lab Results  Component Value Date   VD25OH 79 08/04/2018   Current Outpatient Medications on File Prior to Visit  Medication Sig  . BIOTIN PO Take 1 capsule by mouth daily.  . sertraline (ZOLOFT) 50 MG tablet Take 1 tablet 2 x/day for Mood  . VITAMIN D PO Take 1,000 Units by mouth 2 (two) times daily.   No current facility-administered medications on file prior to visit.   No Known Allergies   Health Maintenance  Topic Date Due  . PAP SMEAR-Modifier  05/13/1987  . COLONOSCOPY  05/12/2016  . INFLUENZA VACCINE  04/15/2019  . MAMMOGRAM  04/12/2020  . TETANUS/TDAP  09/14/2020  . HIV Screening  Completed   Immunization History  Administered Date(s) Administered  .  Influenza Inj Mdck Quad With Preservative 07/13/2017, 08/04/2018  . Tdap 09/14/2010   Last Colon -  12 yrs ago - age 57 (Dx Crohn's - patient denies sx's and declined treatment)  Last MGM - 04/13/2019  History reviewed. No pertinent surgical history.  Social History   Tobacco Use  . Smoking status: Never Smoker  . Smokeless tobacco: Never Used  Substance Use Topics  . Alcohol use: Yes    Comment: wine 3-4 glasses per week  . Drug use: Not on file    ROS Constitutional: Denies fever, chills, weight loss/gain, headaches, insomnia,  night sweats, and change in appetite. Does c/o fatigue. Eyes: Denies redness, blurred vision, diplopia, discharge, itchy, watery eyes.  ENT: Denies discharge, congestion, post nasal drip, epistaxis, sore throat, earache, hearing loss, dental pain, Tinnitus, Vertigo, Sinus pain, snoring.  Cardio: Denies chest pain, palpitations, irregular heartbeat, syncope, dyspnea, diaphoresis, orthopnea, PND, claudication, edema Respiratory: denies cough, dyspnea, DOE, pleurisy, hoarseness, laryngitis, wheezing.  Gastrointestinal: Denies dysphagia, heartburn, reflux, water brash, pain, cramps, nausea, vomiting, bloating, diarrhea, constipation, hematemesis, melena, hematochezia, jaundice, hemorrhoids Genitourinary: Denies dysuria, frequency, urgency, nocturia, hesitancy, discharge, hematuria, flank pain Breast: Breast lumps, nipple discharge, bleeding.  Musculoskeletal: Denies arthralgia, myalgia, stiffness, Jt. Swelling, pain, limp, and strain/sprain. Denies falls. Skin: Denies puritis, rash, hives, warts, acne, eczema, changing in skin lesion Neuro: No weakness, tremor, incoordination, spasms, paresthesia, pain Psychiatric: Denies confusion, memory loss, sensory loss. Denies Depression. Endocrine:  Denies change in weight, skin, hair change, nocturia, and paresthesia, diabetic polys, visual blurring, hyper / hypo glycemic episodes.  Heme/Lymph: No excessive bleeding,  bruising, enlarged lymph nodes.  Physical Exam  BP 112/76   Pulse 64   Temp (!) 97.1 F (36.2 C)   Resp 16   Ht 5\' 8"  (1.727 m)   Wt 145 lb 12.8 oz (66.1 kg)   BMI 22.17 kg/m   General Appearance: Well nourished, well groomed and in no apparent distress.  Eyes: PERRLA, EOMs, conjunctiva no swelling or erythema, normal fundi and vessels. Sinuses: No frontal/maxillary tenderness ENT/Mouth: EACs patent / TMs  nl. Nares clear without erythema, swelling, mucoid exudates. Oral hygiene is good. No erythema, swelling, or exudate. Tongue normal, non-obstructing. Tonsils not swollen or erythematous. Hearing normal.  Neck: Supple, thyroid not palpable. No bruits, nodes or JVD. Respiratory: Respiratory effort normal.  BS equal and clear bilateral without rales, rhonci, wheezing or stridor. Cardio: Heart sounds are normal with regular rate and rhythm and no murmurs, rubs or gallops. Peripheral pulses are normal and equal bilaterally without edema. No aortic or femoral bruits. Chest: symmetric with normal excursions and percussion. Breasts: Deferred to recent Negative MGM Abdomen: Flat, soft with bowel sounds active. Nontender, no guarding, rebound, hernias, masses, or organomegaly.  Lymphatics: Non tender without lymphadenopathy.  Musculoskeletal: Full ROM all peripheral extremities, joint stability, 5/5 strength, and normal gait. Skin: Warm and dry without rashes, lesions, cyanosis, clubbing or  ecchymosis.  Neuro: Cranial nerves intact, reflexes equal bilaterally. Normal muscle tone, no cerebellar symptoms. Sensation intact.  Pysch: Alert and oriented X 3, normal affect, Insight and Judgment appropriate.   Assessment and Plan  1. Annual Preventative Screening Examination  2. Elevated BP without diagnosis of hypertension  - EKG 12-Lead - [place], retroperitnl abd,  ltd - Urinalysis, Routine w reflex microscopic - Microalbumin / Creatinine Urine Ratio - CBC with Diff - COMPLETE METABOLIC  PANEL WITH GFR - Magnesium - TSH  3. Screening cholesterol level  - EKG 12-Lead - [place], retroperitnl abd,  ltd - Lipid Profile - TSH  4. Abnormal glucose  - EKG 12-Lead - [place], retroperitnl abd,  ltd - Hemoglobin A1c ([facility]) - Insulin, random  5. Vitamin D deficiency  - Vitamin D (25 hydroxy)  6. Screening for colorectal cancer  - Cologard  7. Screening for ischemic heart disease  - EKG 12-Lead  8. Screening for AAA (aortic abdominal aneurysm)  - [place], retroperitnl abd,  ltd  9. Fatigue  - Iron,Total/Total Iron Binding Cap - Vitamin B12 - CBC with Diff - TSH  10. Medication management  - Urinalysis, Routine w reflex microscopic - Microalbumin / Creatinine Urine Ratio - CBC with Diff - COMPLETE METABOLIC PANEL WITH GFR - Magnesium - Lipid Profile - TSH - Hemoglobin A1c ([facility]) - Insulin, random - Vitamin D (25 hydroxy)         Patient was counseled in prudent diet to achieve/maintain BMI less than 25 for weight control, BP monitoring, regular exercise and medications. Discussed med's effects and SE's. Screening labs and tests as requested with regular follow-up as recommended. Over 40 minutes of exam, counseling, chart review and high complex critical decision making was performed.   Kirtland Bouchard, MD

## 2019-09-19 NOTE — Patient Instructions (Signed)

## 2019-09-20 LAB — CBC WITH DIFFERENTIAL/PLATELET
Absolute Monocytes: 543 cells/uL (ref 200–950)
Basophils Absolute: 73 cells/uL (ref 0–200)
Basophils Relative: 1.3 %
Eosinophils Absolute: 162 cells/uL (ref 15–500)
Eosinophils Relative: 2.9 %
HCT: 39.3 % (ref 35.0–45.0)
Hemoglobin: 13.4 g/dL (ref 11.7–15.5)
Lymphs Abs: 1943 cells/uL (ref 850–3900)
MCH: 31 pg (ref 27.0–33.0)
MCHC: 34.1 g/dL (ref 32.0–36.0)
MCV: 91 fL (ref 80.0–100.0)
MPV: 10.7 fL (ref 7.5–12.5)
Monocytes Relative: 9.7 %
Neutro Abs: 2878 cells/uL (ref 1500–7800)
Neutrophils Relative %: 51.4 %
Platelets: 225 10*3/uL (ref 140–400)
RBC: 4.32 10*6/uL (ref 3.80–5.10)
RDW: 11.5 % (ref 11.0–15.0)
Total Lymphocyte: 34.7 %
WBC: 5.6 10*3/uL (ref 3.8–10.8)

## 2019-09-20 LAB — URINALYSIS, ROUTINE W REFLEX MICROSCOPIC
Bacteria, UA: NONE SEEN /HPF
Bilirubin Urine: NEGATIVE
Glucose, UA: NEGATIVE
Hgb urine dipstick: NEGATIVE
Hyaline Cast: NONE SEEN /LPF
Ketones, ur: NEGATIVE
Nitrite: NEGATIVE
Protein, ur: NEGATIVE
RBC / HPF: NONE SEEN /HPF (ref 0–2)
Specific Gravity, Urine: 1.022 (ref 1.001–1.03)
WBC, UA: NONE SEEN /HPF (ref 0–5)
pH: 6.5 (ref 5.0–8.0)

## 2019-09-20 LAB — COMPLETE METABOLIC PANEL WITH GFR
AG Ratio: 1.6 (calc) (ref 1.0–2.5)
ALT: 16 U/L (ref 6–29)
AST: 23 U/L (ref 10–35)
Albumin: 4.7 g/dL (ref 3.6–5.1)
Alkaline phosphatase (APISO): 73 U/L (ref 37–153)
BUN: 23 mg/dL (ref 7–25)
CO2: 27 mmol/L (ref 20–32)
Calcium: 9.6 mg/dL (ref 8.6–10.4)
Chloride: 101 mmol/L (ref 98–110)
Creat: 0.79 mg/dL (ref 0.50–1.05)
GFR, Est African American: 99 mL/min/{1.73_m2} (ref >=60)
GFR, Est Non African American: 85 mL/min/{1.73_m2} (ref >=60)
Globulin: 3 g/dL (calc) (ref 1.9–3.7)
Glucose, Bld: 101 mg/dL — ABNORMAL HIGH (ref 65–99)
Potassium: 4.5 mmol/L (ref 3.5–5.3)
Sodium: 138 mmol/L (ref 135–146)
Total Bilirubin: 0.5 mg/dL (ref 0.2–1.2)
Total Protein: 7.7 g/dL (ref 6.1–8.1)

## 2019-09-20 LAB — LIPID PANEL
Cholesterol: 215 mg/dL — ABNORMAL HIGH (ref <200)
HDL: 94 mg/dL (ref >=50)
LDL Cholesterol (Calc): 105 mg/dL (calc) — ABNORMAL HIGH
Non-HDL Cholesterol (Calc): 121 mg/dL (calc) (ref <130)
Total CHOL/HDL Ratio: 2.3 (calc) (ref <5.0)
Triglycerides: 71 mg/dL (ref <150)

## 2019-09-20 LAB — MICROALBUMIN / CREATININE URINE RATIO
Creatinine, Urine: 93 mg/dL (ref 20–275)
Microalb Creat Ratio: 4 mcg/mg creat (ref <30)
Microalb, Ur: 0.4 mg/dL

## 2019-09-20 LAB — IRON, TOTAL/TOTAL IRON BINDING CAP
%SAT: 36 % (calc) (ref 16–45)
Iron: 137 ug/dL (ref 45–160)
TIBC: 383 mcg/dL (calc) (ref 250–450)

## 2019-09-20 LAB — TSH: TSH: 2.04 mIU/L

## 2019-09-20 LAB — INSULIN, RANDOM: Insulin: 3.7 u[IU]/mL

## 2019-09-20 LAB — VITAMIN D 25 HYDROXY (VIT D DEFICIENCY, FRACTURES): Vit D, 25-Hydroxy: 33 ng/mL (ref 30–100)

## 2019-09-20 LAB — VITAMIN B12: Vitamin B-12: 385 pg/mL (ref 200–1100)

## 2019-09-20 LAB — HEMOGLOBIN A1C
Hgb A1c MFr Bld: 5.1 % of total Hgb (ref <5.7)
Mean Plasma Glucose: 100 (calc)
eAG (mmol/L): 5.5 (calc)

## 2019-09-20 LAB — MAGNESIUM: Magnesium: 2.1 mg/dL (ref 1.5–2.5)

## 2019-10-04 DIAGNOSIS — L729 Follicular cyst of the skin and subcutaneous tissue, unspecified: Secondary | ICD-10-CM | POA: Diagnosis not present

## 2019-10-04 DIAGNOSIS — L723 Sebaceous cyst: Secondary | ICD-10-CM | POA: Diagnosis not present

## 2019-11-28 ENCOUNTER — Telehealth: Payer: Self-pay | Admitting: *Deleted

## 2019-11-28 NOTE — Telephone Encounter (Signed)
Spoke with patient and reminded to return her Cologuard.

## 2020-02-10 ENCOUNTER — Other Ambulatory Visit: Payer: Self-pay

## 2020-02-10 ENCOUNTER — Telehealth (INDEPENDENT_AMBULATORY_CARE_PROVIDER_SITE_OTHER): Payer: BC Managed Care – PPO | Admitting: Psychiatry

## 2020-02-10 ENCOUNTER — Encounter (HOSPITAL_COMMUNITY): Payer: Self-pay | Admitting: Psychiatry

## 2020-02-10 DIAGNOSIS — F331 Major depressive disorder, recurrent, moderate: Secondary | ICD-10-CM

## 2020-02-10 MED ORDER — VORTIOXETINE HBR 10 MG PO TABS: 10.0000 mg | ORAL_TABLET | Freq: Every day | ORAL | 1 refills | Status: DC

## 2020-02-10 NOTE — Progress Notes (Signed)
Virtual Visit via Video Note  I connected with Jacqueline Banks on 02/10/20 at 11:00 AM EDT by a video enabled telemedicine application and verified that I am speaking with the correct person using two identifiers.  Location: Patient: home Provider: office   I discussed the limitations of evaluation and management by telemedicine and the availability of in person appointments. The patient expressed understanding and agreed to proceed.   Psychiatric Initial Adult Assessment   Patient Identification: Jacqueline Banks MRN:  [id number] Date of Evaluation:  02/10/2020 Referral Source: self Chief Complaint:   Chief Complaint    Establish Care     Visit Diagnosis:    ICD-10-CM   1. MDD (major depressive disorder), recurrent episode, moderate (HCC)  F33.1 vortioxetine HBr (TRINTELLIX) 10 MG TABS tablet    History of Present Illness:  Pt is seeking help for depression. Her brother died in his sleep and it was devastating. She started therapy and it helped but her therapist retired and she stopped. She was treated with Zoloft for 1 yr. It helps but not enough "I am functioning but just barely". Today she shares that she is very depressed. She has low energy and low motivation. She feels drags herself. Jacqueline Banks is very hard on herself and often gets mad at herself. She is only sleeping about 7 hrs with the wine. Her appetite is increased and she is gained weight. It makes her feel bad. She is endorsing isolation and anhedonia. Jacqueline Banks loves her job and she is worried that her depression could affect it. She denies SI/HI.    Associated Signs/Symptoms: Depression Symptoms:  depressed mood, anhedonia, insomnia, fatigue, feelings of worthlessness/guilt, loss of energy/fatigue, weight gain, increased appetite,   (Hypo) Manic Symptoms:  denies   Anxiety Symptoms:  denies   Psychotic Symptoms:  denies   PTSD Symptoms:denies    Past Psychiatric History: Dx: depression dx at 54yo by  therapist Meds: trintellix effective but made her sick; zoloft started by pcp Previous psychiatrist/therapist: denies seeing psychiatrist but did have therapy with [facility] for 1 yr in 2020 Hospitalizations: denies SIB: denies Suicide attempts: denies Hx of violent behavior towards others: denies Current access to guns: denies Hx of abuse: denies Military Hx: denies Hx of Seizures: denies Hx of TBI: denies  Substance Abuse History in the last 12 months:  Yes.    Consequences of Substance Abuse: Negative  Past Medical History:  Past Medical History:  Diagnosis Date  . Crohn disease ([place])   . Eczema   . Vitamin D deficiency     Past Surgical History:  Procedure Laterality Date  . COLONOSCOPY      Family Psychiatric History:   Family History  Problem Relation Age of Onset  . Bipolar disorder Sister   . Anxiety disorder Brother     Social History:   Social History   Socioeconomic History  . Marital status: Married    Spouse name: Not on file  . Number of children: 4  . Years of education: 16  . Highest education level: Bachelor's degree (e.g., BA, AB, BS)  Occupational History  . Not on file  Tobacco Use  . Smoking status: Never Smoker  . Smokeless tobacco: Never Used  Substance and Sexual Activity  . Alcohol use: Yes    Comment: wine 3-4 glasses per night about 5 nights/week  . Drug use: Never  . Sexual activity: Not on file  Other Topics Concern  . Not on file  Social History Narrative  Current living in [place] with husand and 2 kids   Born and raised in [place] by both parents   Siblings 6 total- pt is 3rd child- 4 boys and 2 girls   Married 71yrs      Legal issues- denies   Social Determinants of Radio broadcast assistant Strain:   . Difficulty of Paying Living Expenses:   Food Insecurity:   . Worried About Charity fundraiser in the Last Year:   . Arboriculturist in the Last Year:   Transportation Needs:   . Lexicographer (Medical):   Marland Kitchen Lack of Transportation (Non-Medical):   Physical Activity:   . Days of Exercise per Week:   . Minutes of Exercise per Session:   Stress:   . Feeling of Stress :   Social Connections:   . Frequency of Communication with Friends and Family:   . Frequency of Social Gatherings with Friends and Family:   . Attends Religious Services:   . Active Member of Clubs or Organizations:   . Attends Archivist Meetings:   Marland Kitchen Marital Status:       Allergies:  No Known Allergies  Metabolic Disorder Labs: Lab Results  Component Value Date   HGBA1C 5.1 09/19/2019   MPG 100 09/19/2019   MPG 103 08/04/2018   No results found for: PROLACTIN Lab Results  Component Value Date   CHOL 215 (H) 09/19/2019   TRIG 71 09/19/2019   HDL 94 09/19/2019   CHOLHDL 2.3 09/19/2019   VLDL 11 04/06/2016   LDLCALC 105 (H) 09/19/2019   LDLCALC 86 08/04/2018   Lab Results  Component Value Date   TSH 2.04 09/19/2019    Therapeutic Level Labs: No results found for: LITHIUM No results found for: CBMZ No results found for: VALPROATE  Current Medications: Current Outpatient Medications  Medication Sig Dispense Refill  . BIOTIN PO Take 1 capsule by mouth daily.    . sertraline (ZOLOFT) 50 MG tablet Take 1 tablet 2 x/day for Mood (Patient taking differently: 75 mg. Take 1 tablet 2 x/day for Mood) 180 tablet 3  . VITAMIN D PO Take 1,000 Units by mouth 2 (two) times daily.    Marland Kitchen vortioxetine HBr (TRINTELLIX) 10 MG TABS tablet Take 1 tablet (10 mg total) by mouth daily. 30 tablet 1   No current facility-administered medications for this visit.      Psychiatric Specialty Exam: Review of Systems  There were no vitals taken for this visit.There is no height or weight on file to calculate BMI.  General Appearance: Casual and Neat  Eye Contact:  Good  Speech:  Clear and Coherent and Normal Rate  Volume:  Normal  Mood:  Depressed  Affect:  Full Range  Thought Process:   Goal Directed and Descriptions of Associations: Intact  Orientation:  Full (Time, Place, and Person)  Thought Content:  Logical  Suicidal Thoughts:  No  Homicidal Thoughts:  No  Memory:  Immediate;   Good  Judgement:  Good  Insight:  Good  Psychomotor Activity:  Normal  Concentration:  Concentration: Good  Recall:  Good  Fund of Knowledge:Good  Language: Good  Akathisia:  No  Handed:  Right  AIMS (if indicated):  n/a  Assets:  Communication Skills Desire for Improvement Financial Resources/Insurance Housing Intimacy Leisure Time Physical Health Resilience Social Support Talents/Skills Transportation Vocational/Educational  ADL's:  Intact  Cognition: WNL  Sleep:  Poor   Screenings: PHQ2-9  Office Visit from 09/19/2019 in [facility] [place] Office Visit from 11/08/2018 in [facility] [place] [facility] Office Visit from 08/04/2018 in [facility] Office Visit from 07/13/2017 in [facility] Visit from 04/06/2016 in [facility]  PHQ-2 Total Score  0  5  0  0  1  PHQ-9 Total Score  --  14  --  --  --      Assessment and Plan: MDD- recurrent, moderate  Confidentiality and exclusions reviewed with pt who verbalized understanding.   Medication management with supportive therapy. Risks and benefits, side effects and alternative treatment options discussed with patient. Pt was given an opportunity to ask questions about medication, illness, and treatment. All current psychiatric medications have been reviewed and discussed with the patient and adjusted as clinically appropriate. The patient has been provided an accurate and updated list of the medications being now prescribed. Pt verbalized understanding and verbal consent obtained for treatment.  The risk of un-intended pregnancy is low based on the fact that pt reports she is  in menopause. Pt is aware that these meds carry a teratogenic risk. Pt will discuss plan of action if she does or plans to become pregnant in the future.  Meds: d/c Zoloft Start trial of Trintillex 10mg  po qD    Labs: will request from PCP  Therapy: brief supportive therapy provided.  Discussed psychosocial stressors in detail.   Recommended pt stop all drug and alcohol use  Consultations: Referred for therapy Encouraged to follow up with PCP as needed   Pt denies SI and is at an acute low risk for suicide. Patient told to call clinic if any problems occur. Patient advised to go to ER if they should develop SI/HI, side effects, or if symptoms worsen. Pt has crisis numbers to call if needed. Pt acknowledged and agreed with plan and verbalized understanding.  F/up in 1 months or sooner if needed  The duration of this appointment visit was 45 minutes of face-to-face time with the patient.  Greater than 50% of this time was spent in counseling, explanation of  diagnosis, planning of further management, and coordination of care     Charlcie Cradle, MD 5/29/202111:48 AM

## 2020-02-21 ENCOUNTER — Other Ambulatory Visit: Payer: Self-pay

## 2020-02-21 ENCOUNTER — Ambulatory Visit (INDEPENDENT_AMBULATORY_CARE_PROVIDER_SITE_OTHER): Payer: BC Managed Care – PPO | Admitting: Licensed Clinical Social Worker

## 2020-02-21 DIAGNOSIS — F1010 Alcohol abuse, uncomplicated: Secondary | ICD-10-CM | POA: Diagnosis not present

## 2020-02-21 DIAGNOSIS — F332 Major depressive disorder, recurrent severe without psychotic features: Secondary | ICD-10-CM

## 2020-02-21 NOTE — Progress Notes (Signed)
Virtual Visit via Video Note   I connected with Jacqueline Banks. Tracz on 02/21/20 at 10:00am by video enabled telemedicine application and verified that I am speaking with the correct person using two identifiers.   I discussed the limitations, risks, security and privacy concerns of performing an evaluation and management service by telephone and the availability of in person appointments. I also discussed with the patient that there may be a patient responsible charge related to this service. The patient expressed understanding and agreed to proceed.   I discussed the assessment and treatment plan with the patient. The patient was provided an opportunity to ask questions and all were answered. The patient agreed with the plan and demonstrated an understanding of the instructions.   The patient was advised to call back or seek an in-person evaluation if the symptoms worsen or if the condition fails to improve as anticipated.  Location:  Patient: Patient Home Clinician: [facility]   I provided 1 hour of non-face-to-face time during this encounter.     Shade Flood, LCSW, LCAS ______________________________ Comprehensive Clinical Assessment (CCA) Note  02/21/2020 [name] [id number]  Visit Diagnosis:      ICD-10-CM   1. Severe episode of recurrent major depressive disorder, without psychotic features ([place])  F33.2   2. Alcohol use disorder, mild, abuse  F10.10      CCA Part One   Part One has been completed on paper by the patient.  (See scanned document in Chart Review)  CCA Biopsychosocial  Intake/Chief Complaint:  CCA Intake With Chief Complaint CCA Part Two Date: 02/21/20 CCA Part Two Time: 62 Chief Complaint/Presenting Problem: "I have depression.  My whole family has suffered from mental health honestly.  I think its genetic or chemical.  There hasn't been an event or anything I think I need to push through". Patient's Currently Reported Symptoms/Problems: Anberlin  endorsed symptoms of depression which have worsened in recent months, including: decreased energy, anhedonia, trouble concentrating, fatigue, feelings of hopelessness, feelings of worthlessness, increased appetite to cope, irritability, decreased sleep, tearfulness, weight gain.  She denied hx of mania, or anxiety.  Breena reported that additional symptoms include emotional numbing, anger towards herself for choices she has made, some resentment towards husband, and chronic feelings of emptiness. Individual's Strengths: "I have a very nice life I've created by myself, I'm taking my medicine and it seems to be helping a lot.  I have a good job, I'm healthy, have a roof over my head, pretty good marriage, great kids". Individual's Preferences: "I'm big on faith, so I want to learn how to manage symptoms without medication at some point". Individual's Abilities: Hardworker, intelligent, responsible, motivated for treatment. Type of Services Patient Feels Are Needed: Individual therapy, psychiatry, medication management. Initial Clinical Notes/Concerns: [name] is a 54 year old married Caucasian female that presented today for virtual assessment as alert, oriented x5, with no evidence or self-report of SI/HI or A/V H. Courtlynn endorsed worsening symptoms of depression in past months, and was previously engaged in therapy at [facility] following loss of brother 3 years ago, but discontinued services once her therapist retired and she felt more stable.  [name] reported that she has sought therapy again in conjunction with psychiatry for medication to improve overall daily functioning and avoid this impacting her employment and relationships.  She reported that she has supportive family and friends to rely on, a good job, and stable housing.  [name] acknowledged that she lacks coping skills and  self-care routine, and has been drinking 2 glasses of wine x5 night per week before bed to de-stress for  roughly 15 years.  [name] reported that this has not caused many serious problems, but she fears that it will affect her physical and mental health in time and she believes she has developed a tolerance as well.  [name] reported that she is embarrassed of this habit and does not want to rely too heavily on this for self-medication, as she noted she was able to quit on behavioral medication in the past, stating "I just had no desire to drink at the time".  Mental Health Symptoms Depression:  Depression: Change in energy/activity, Difficulty Concentrating, Fatigue, Hopelessness, Increase/decrease in appetite, Irritability, Sleep (too much or little), Tearfulness, Weight gain/loss, Worthlessness, Duration of symptoms greater than two weeks  Mania:  Mania: None  Anxiety:   Anxiety: None  Psychosis:  Psychosis: None  Trauma:  Trauma: Emotional numbing, Irritability/anger  Obsessions:  Obsessions: None  Compulsions:  Compulsions: None  Inattention:  Inattention: None  Hyperactivity/Impulsivity:  Hyperactivity/Impulsivity: N/A  Oppositional/Defiant Behaviors:  Oppositional/Defiant Behaviors: Resentful  Emotional Irregularity:  Emotional Irregularity: Chronic feelings of emptiness  Other Mood/Personality Symptoms:      Mental Status Exam Appearance and self-care  Stature:  Stature: Average(5'9, self-reported.)  Weight:  Weight: Average weight  Clothing:  Clothing: Casual  Grooming:  Grooming: Normal  Cosmetic use:  Cosmetic Use: Age appropriate  Posture/gait:  Posture/Gait: Normal  Motor activity:  Motor Activity: Not Remarkable  Sensorium  Attention:  Attention: Normal  Concentration:  Concentration: Normal  Orientation:  Orientation: X5  Recall/memory:  Recall/Memory: Normal  Affect and Mood  Affect:  Affect: Appropriate  Mood:  Mood: Euthymic  Relating  Eye contact:  Eye Contact: Normal  Facial expression:  Facial Expression: Responsive  Attitude toward examiner:  Attitude Toward  Examiner: Cooperative  Thought and Language  Speech flow: Speech Flow: Normal  Thought content:  Thought Content: Appropriate to Mood and Circumstances  Preoccupation:  Preoccupations: None  Hallucinations:  Hallucinations: None  Organization:     Transport planner of Knowledge:  Fund of Knowledge: Good  Intelligence:  Intelligence: Average  Abstraction:  Abstraction: Normal  Judgement:  Judgement: Good  Reality Testing:  Reality Testing: Realistic  Insight:  Insight: Good  Decision Making:  Decision Making: Normal  Social Functioning  Social Maturity:  Social Maturity: Responsible  Social Judgement:  Social Judgement: Normal  Stress  Stressors:  Stressors: Grief/losses  Coping Ability:  Coping Ability: Resilient, Research officer, political party Deficits:  Skill Deficits: Self-care  Supports:  Supports: Church, Family, Friends/Service system   Religion: Religion/Spirituality Are You A Religious Person?: Yes What is Your Religious Affiliation?: Catholic How Might This Affect Treatment?: "I think it will be complementary".  Leisure/Recreation: Leisure / Recreation Do You Have Hobbies?: Yes Leisure and Hobbies: "I don't have any".  Exercise/Diet: Exercise/Diet Do You Exercise?: No Have You Gained or Lost A Significant Amount of Weight in the Past Six Months?: Yes-Gained Number of Pounds Gained: 10 Do You Follow a Special Diet?: No Do You Have Any Trouble Sleeping?: Yes Explanation of Sleeping Difficulties: "I have trouble falling asleep some nights, get 6-7 hours per night".   CCA Employment/Education  Employment/Work Situation: Employment / Work Situation Employment situation: Employed Where is patient currently employed?: [employer], a Health visitor How long has patient been employed?: 3 years Patient's job has been impacted by current illness: No What is the longest time patient has a held  a job?: 18-19 years Where was the patient employed at that time?: [place]  [employer] Has patient ever been in the [employer]?: No  Education: Education Is Patient Currently Attending School?: No Last Grade Completed: 12 Name of High School: [employer] Did Teacher, adult education From [employer]?: Yes Did Physicist, medical?: Yes What Type of College Degree Do you Have?: Bachelor of Arts for Communication in [place] Did You Have Any Difficulty At [employer]?: No Patient's Education Has Been Impacted by Current Illness: No   CCA Family/Childhood History  Family and Relationship History: Family history Marital status: Married Number of Years Married: 72 What types of issues is patient dealing with in the relationship?: "Sub surfacely, we aren't functioning that well, we get along but thats about it". Are you sexually active?: No What is your sexual orientation?: Heterosexual Has your sexual activity been affected by drugs, alcohol, medication, or emotional stress?: "Yes, all of the above". Does patient have children?: Yes How many children?: 4 How is patient's relationship with their children?: Two step children, two biological; relationships with step children are good, those with biological are 'awesome'.  Childhood History:  Childhood History By whom was/is the patient raised?: Both parents Description of patient's relationship with caregiver when they were a child: "It was so good, no abuse of any kind, lots of brothers and sisters.  It was fun". Patient's description of current relationship with people who raised him/her: "My father is still alive and well, we get along.  My mother passed away 15 years ago". How were you disciplined when you got in trouble as a child/adolescent?: "I was spanked, that was it". Does patient have siblings?: Yes Number of Siblings: 6 Description of patient's current relationship with siblings: "So good, they are my best friends.  They're all in [place] though, so its long distance". Did patient suffer any  verbal/emotional/physical/sexual abuse as a child?: No Did patient suffer from severe childhood neglect?: No Has patient ever been sexually abused/assaulted/raped as an adolescent or adult?: No Was the patient ever a victim of a crime or a disaster?: No Witnessed domestic violence?: No Has patient been affected by domestic violence as an adult?: No   CCA Substance Use  Alcohol/Drug Use: Alcohol / Drug Use Pain Medications: Denied. Prescriptions: Trintellix Over the Counter: Vitamins History of alcohol / drug use?: Yes Longest period of sobriety (when/how long): 1 year when pregnant Negative Consequences of Use: Personal relationships, Work / School Withdrawal Symptoms: Patient aware of relationship between substance abuse and physical/medical complications(Denied withdrawals when not drinking.) Substance #1 Name of Substance 1: Alcohol 1 - Age of First Use: [age]; drank Thurs-Sat on average; cannot recall how much, tended to drink beer 1 - Amount (size/oz): 2 glasses of wine; "A pretty good size glass"; more than half a bottle per night; 1 - Frequency: 5 nights per week 1 - Duration: 15+ years 1 - Last Use / Amount: Last night, two glasses of wine  ASAM's:  Six Dimensions of Multidimensional Assessment  Dimension 1:  Acute Intoxication and/or Withdrawal Potential:   Dimension 1:  Description of individual's past and current experiences of substance use and withdrawal: Denied any withdrawals; drinking pattern of wine for 15 years, 2 glasses per night, 5 nights per week.  Dimension 2:  Biomedical Conditions and Complications:   Dimension 2:  Description of patient's biomedical conditions and  complications: Acknowledged that sleep could be impacted by nightly drinking, worried about skin conditions being linked to use.  Dimension  3:  Emotional, Behavioral, or Cognitive Conditions and Complications:  Dimension 3:  Description of emotional, behavioral, or cognitive conditions and  complications: "I feel shame and embarassment because of it.  I think I can still act well around other people, but me and my husband are more reserved".  Dimension 4:  Readiness to Change:  Dimension 4:  Description of Readiness to Change criteria: "I need help like yesterday.  I'm ready for change".  Dimension 5:  Relapse, Continued use, or Continued Problem Potential:  Dimension 5:  Relapse, continued use, or continued problem potential critiera description: "The last time I was on depression medication, I had no desire to drink, so I'm hoping that will help once my dose increases".  Dimension 6:  Recovery/Living Environment:  Dimension 6:  Recovery/Iiving environment criteria description: Destin reported that she has a nice home, good job, good supports, but the relationship with her husband has declined, and she has felt more lonely.  ASAM Severity Score: ASAM's Severity Rating Score: 6  ASAM Recommended Level of Treatment: ASAM Recommended Level of Treatment: Level I Outpatient Treatment   Substance use Disorder (SUD) Substance Use Disorder (SUD)  Checklist Symptoms of Substance Use: Continued use despite having a persistent/recurrent physical/psychological problem caused/exacerbated by use, Evidence of tolerance  Recommendations for Services/Supports/Treatments: Recommendations for Services/Supports/Treatments Recommendations For Services/Supports/Treatments: Individual Therapy, Medication Management  DSM5 Diagnoses: Patient Active Problem List   Diagnosis Date Noted  . Grief reaction 04/06/2016  . Screening for colorectal cancer 03/21/2015  . Vitamin D deficiency 11/22/2013    Patient Centered Plan: Patient is on the following Treatment Plan(s):  Depression and Impulse Control  Treatment Plan Summary: Jakeia Carreras. Encinas is diagnosed with Major Depressive Disorder, Recurrent, Severe and Alcohol Use Disorder, Mild.  She is appropriate for individual therapy and medication management in  conjunction with psychiatrist followups for monitoring.  Treatment goals created in collaboration with Beverlee Nims include the following:  Meet with clinician x1 per week for therapy session to monitor progress towards goals and receive assistance in addressing barriers to success as needed; Meet with psychiatrist x1 every 1-2 months to monitor efficacy of medication and make changes as needed to dose/regimen if necessary; Take medication daily as prescribed to aid in symptom reduction and improvement of daily functioning; Reduce depression from average severity of 8/10 down to 4/10 within next 90 days by creating a self-care routine to engage in 1-2 hours each night involving exploration of 3-5 coping skills/self-care activities which lead to mood improvement and relaxation; Begin exercise routine x3 per week for 45 minutes to 1 hour each event in effort to work towards improving both mental and physical wellbeing in next 90 days; Utilize therapy sessions as needed to process any lasting effects of grief and loss surrounding deceased family members which might impact state of depression/mood; Implement 4-5 sleep hygiene techniques within next 90 days in effort to increase nightly rest to 8 hours on average for reduced irritability and fatigue; Consider engaging in marriage counseling within next 6 months if state of marriage does not improve following consistent individual counseling focused on personal problems; Utilize harm reduction approach to cut back on alcohol intake and avoiding worsening impact on social and occupational life by limiting consumption to 2 glasses of wine, 3 times per week instead of 5.     Referrals to Alternative Service(s): Referred to Alternative Service(s):   Place:   Date:   Time:    Referred to Alternative Service(s):   Place:  Date:   Time:    Referred to Alternative Service(s):   Place:   Date:   Time:    Referred to Alternative Service(s):   Place:   Date:   Time:     Granville Lewis, Deon Pilling 02/21/20

## 2020-02-29 ENCOUNTER — Other Ambulatory Visit: Payer: Self-pay

## 2020-02-29 ENCOUNTER — Encounter (HOSPITAL_COMMUNITY): Payer: Self-pay | Admitting: Psychiatry

## 2020-02-29 ENCOUNTER — Telehealth (INDEPENDENT_AMBULATORY_CARE_PROVIDER_SITE_OTHER): Payer: BC Managed Care – PPO | Admitting: Psychiatry

## 2020-02-29 DIAGNOSIS — F331 Major depressive disorder, recurrent, moderate: Secondary | ICD-10-CM

## 2020-02-29 MED ORDER — VORTIOXETINE HBR 5 MG PO TABS: ORAL_TABLET | ORAL | 1 refills | Status: DC

## 2020-02-29 MED ORDER — VORTIOXETINE HBR 10 MG PO TABS: ORAL_TABLET | ORAL | 1 refills | Status: DC

## 2020-02-29 NOTE — Progress Notes (Signed)
Virtual Visit via Telephone Note  I connected with Jacqueline Banks on 02/29/20 at 11:00 AM EDT by telephone and verified that I am speaking with the correct person using two identifiers.  Location: Patient: home Provider: office   I discussed the limitations, risks, security and privacy concerns of performing an evaluation and management service by telephone and the availability of in person appointments. I also discussed with the patient that there may be a patient responsible charge related to this service. The patient expressed understanding and agreed to proceed.   History of Present Illness: Jacqueline Banks is feeling a little better. She is not sleeping well. It could be because of the wine. She is drinking 2-4 glasses of wine per night. She feels like it is a Air traffic controller.  She is wondering if she can take the Trintillex at bedtime with Melatonin. She wants to stop drinking wine. Jacqueline Banks reports she is feeling a little more motivated. She is cooking more because she she wants to. Her smile feels more genuine. Jacqueline Banks does not feel as down as before and is not as hard on herself as before. Her concern is that she still feels like she is dragging. The Trintillex dose has not made her apathetic and is definitely better than the Zoloft. Jacqueline Banks denies SI/HI. When she doesn't eat then she experiences some nausea with the medication.    Observations/Objective:  General Appearance: unable to assess  Eye Contact:  unable to assess  Speech:  Clear and Coherent and Normal Rate  Volume:  Normal  Mood:  Depressed  Affect:  Full Range  Thought Process:  Goal Directed and Descriptions of Associations: Intact  Orientation:  Full (Time, Place, and Person)  Thought Content:  Logical  Suicidal Thoughts:  No  Homicidal Thoughts:  No  Memory:  Immediate;   Good  Judgement:  Good  Insight:  Good  Psychomotor Activity: unable to assess  Concentration:  Concentration: Good  Recall:  Good  Fund of Knowledge:  Good   Language:  Good  Akathisia:  unable to assess  Handed:  Right  AIMS (if indicated):     Assets:  Communication Skills Desire for Improvement Financial Resources/Insurance Housing Intimacy Leisure Time Resilience Social Support Talents/Skills Transportation Vocational/Educational  ADL's:  unable to assess  Cognition:  WNL  Sleep:         Assessment and Plan:  MDD- recurrent, moderate  Increase Trintillix 15mg  po qD  Recommended Trevor stop all alcohol use  Follow Up Instructions: In 2 months or sooner if needed   I discussed the assessment and treatment plan with the patient. The patient was provided an opportunity to ask questions and all were answered. The patient agreed with the plan and demonstrated an understanding of the instructions.   The patient was advised to call back or seek an in-person evaluation if the symptoms worsen or if the condition fails to improve as anticipated.  I provided 20 minutes of non-face-to-face time during this encounter.   Charlcie Cradle, MD

## 2020-03-04 ENCOUNTER — Other Ambulatory Visit: Payer: Self-pay

## 2020-03-04 ENCOUNTER — Ambulatory Visit (INDEPENDENT_AMBULATORY_CARE_PROVIDER_SITE_OTHER): Payer: BC Managed Care – PPO | Admitting: Licensed Clinical Social Worker

## 2020-03-04 DIAGNOSIS — F332 Major depressive disorder, recurrent severe without psychotic features: Secondary | ICD-10-CM

## 2020-03-04 DIAGNOSIS — F1010 Alcohol abuse, uncomplicated: Secondary | ICD-10-CM

## 2020-03-04 NOTE — Progress Notes (Signed)
Virtual Visit via Video Note   I connected with Jacqueline Banks on 03/04/20 at 10:00am by video enabled telemedicine application and verified that I am speaking with the correct person using two identifiers.   I discussed the limitations, risks, security and privacy concerns of performing an evaluation and management service by telephone and the availability of in person appointments. I also discussed with the patient that there may be a patient responsible charge related to this service. The patient expressed understanding and agreed to proceed.   I discussed the assessment and treatment plan with the patient. The patient was provided an opportunity to ask questions and all were answered. The patient agreed with the plan and demonstrated an understanding of the instructions.   The patient was advised to call back or seek an in-person evaluation if the symptoms worsen or if the condition fails to improve as anticipated.   I provided 1 hour of non-face-to-face time during this encounter.     Shade Flood, LCSW, LCAS ________________________ THERAPIST PROGRESS NOTE   Session Time: 10:00am - 11:00am  Location: Patient: Patient Home Provider: [facility] [place] [facility]    Participation Level: Active   Behavioral Response: Alert, well groomed, casually dressed, combined anxious and depressed affect/mood   Type of Therapy:  Individual Therapy   Treatment Goals addressed: Medication compliance; Acquiring abstinence from alcohol; Communication skills and marriage counseling   Interventions: CBT, assertive communication, motivational interviewing.     Summary: Jacqueline Banks is a 54 year old married Caucasian female that presented for virtual session today and is diagnosed with Major Depressive Disorder, Recurrent, Severe and Alcohol Use Disorder, Mild.        Suicidal/Homicidal: None; without intent or plan   Therapist Response: Clinician spoke with Jacqueline Banks for virtual therapy session today.   Clinician assessed for safety, sobriety, and medication compliance.  Jacqueline Banks presented for appointment on time and was alert, oriented x5, with no evidence or self-report of SI/HI or A/V H.  Jacqueline Banks reported ongoing compliance with medication and stated "I feel like the medication is starting to make me feel a little better".  Jacqueline Banks reported that she continues to drink 2 glasses of wine nightly.  Clinician inquired about Jacqueline Banks's emotional ratings today, as well as any significant changes in thoughts, feelings, or behavior since assessment was completed.  Jacqueline Banks reported scores of 3/10 for both depression, and anxiety, stating "I feel pretty good today".  Clinician inquired about Jacqueline Banks's routine in past week, as well as notable successes or present challenges that need to be addressed today in therapy.  Jacqueline Banks reported that although the medication has helped somewhat and she has been more motivated to prepare meals at dinnertime for the family, take the dog for walks, and take her child to the pool to swim, she has been unable to take on bigger goals such as starting a workout routine, and feels 'weighed down' most days.  Clinician utilized OARS (open questions, affirmation, reflective listening and summary reflections) to encourage Jacqueline Banks to open up more about her concerns today, provide validation, build rapport, increase insight, and explore realistic solutions.  Jacqueline Banks was receptive to this approach, and reported that her relationship with her husband has been her biggest concern at this time, since they seem to be drifting apart, and she worries that they may not be compatible.  Clinician inquired about whether Jacqueline Banks has expressed her concerns to her husband, and how she might go about this assertively to avoid sending mixed messages and compartmentalizing.  Jacqueline Banks reported that she is guilty of communication traps such as mind reading, hinting, and passive aggressive behavior, so although she feels like he is aware that  there is a problem, they have not explicitly touched upon this and she worries she might need a 'referee' to mediate.  Clinician inquired about whether this relationship and Jacqueline Banks's repression of difficult feelings might have a direct influence upon Jacqueline Banks's drinking pattern, and noted Jacqueline Banks's previously voiced concerns that it might become more problematic with time.  Jacqueline Banks acknowledged that she feels like she has developed "A relationship with wine" that only makes her feel more lonely and guilty the morning after, but remains ambivalent about discontinuing use at this time.  Clinician inquired about possibility of Jacqueline Banks engaging in marriage counseling with her husband to see whether relationship can be worked upon with assistance from a professional to mediate.  Clinician recommended Jacqueline Banks to take time to journal about what she intends to say beforehand so that she can properly express herself and work towards increasing openness with husband.  Intervention was effective, as evidenced by Jacqueline Banks reporting that this session was helpful for giving her an outlet to express herself, contemplating her first step in resolving marriage issues, and how to have an honest conversation with her husband.  Jacqueline Banks reported that she would write a letter to organize her thoughts before she approaches her husband, and thanked clinician for input today.  Clinician will continue to monitor.                        Plan: Meet again in 1 week.   Diagnosis: Major Depressive Disorder, Recurrent, Severe and Alcohol Use Disorder, Mild.   Shade Flood, LCSW, LCAS 03/04/20

## 2020-03-11 ENCOUNTER — Ambulatory Visit (HOSPITAL_COMMUNITY): Payer: BC Managed Care – PPO | Admitting: Licensed Clinical Social Worker

## 2020-03-11 ENCOUNTER — Other Ambulatory Visit: Payer: Self-pay

## 2020-03-11 DIAGNOSIS — H04331 Acute lacrimal canaliculitis of right lacrimal passage: Secondary | ICD-10-CM | POA: Diagnosis not present

## 2020-03-12 ENCOUNTER — Ambulatory Visit (INDEPENDENT_AMBULATORY_CARE_PROVIDER_SITE_OTHER): Payer: BC Managed Care – PPO | Admitting: Licensed Clinical Social Worker

## 2020-03-12 ENCOUNTER — Other Ambulatory Visit: Payer: Self-pay

## 2020-03-12 DIAGNOSIS — F332 Major depressive disorder, recurrent severe without psychotic features: Secondary | ICD-10-CM | POA: Diagnosis not present

## 2020-03-12 DIAGNOSIS — F1010 Alcohol abuse, uncomplicated: Secondary | ICD-10-CM

## 2020-03-12 NOTE — Progress Notes (Signed)
Virtual Visit via Video Note   I connected with Jacqueline Banks on 03/12/20 at 2:00pm by video enabled telemedicine application and verified that I am speaking with the correct person using two identifiers.   I discussed the limitations, risks, security and privacy concerns of performing an evaluation and management service by telephone and the availability of in person appointments. I also discussed with the patient that there may be a patient responsible charge related to this service. The patient expressed understanding and agreed to proceed.   I discussed the assessment and treatment plan with the patient. The patient was provided an opportunity to ask questions and all were answered. The patient agreed with the plan and demonstrated an understanding of the instructions.   The patient was advised to call back or seek an in-person evaluation if the symptoms worsen or if the condition fails to improve as anticipated.   I provided 1 hour of non-face-to-face time during this encounter.     Jacqueline Flood, LCSW, LCAS ________________________ THERAPIST PROGRESS NOTE   Session Time: 2:00pm - 3:00pm  Location: Patient: Patient Home Provider: [facility] [place] [facility]    Participation Level: Active   Behavioral Response: Alert, well groomed, casually dressed, combined anxious and depressed mood, anxious affect   Type of Therapy:  Individual Therapy   Treatment Goals addressed: Medication compliance; Maintaining abstinence from alcohol; Communication skills and marriage counseling   Interventions: CBT, assertive communication, motivational interviewing, solution focused.     Summary: Jacqueline Banks is a 54 year old married Caucasian female that presented for virtual session today and is diagnosed with Major Depressive Disorder, Recurrent, Severe and Alcohol Use Disorder, Mild.        Suicidal/Homicidal: None; without intent or plan   Therapist Response: Clinician spoke with Jacqueline Banks for virtual  therapy appointment and assessed for safety, sobriety, and medication compliance.  Jacqueline Banks presented for session on time and was alert, oriented x5, with no evidence or self-report of SI/HI or A/V H.  Jacqueline Banks reported that she is taking medication as prescribed and noted that her dosage was recently increased to 15mg  upon speaking with MD.  Jacqueline Banks reported that yesterday was the first day she abstained from drinking any alcohol in over 1 year and stated "I want to stay sober and see what changes".  Clinician praised Jacqueline Banks for her abstinence efforts and inquired about whether there were any recent events which influenced change in motivation towards cessation.  Jacqueline Banks reported that she went out for a 'girls night' over the weekend with friends and realized that her tolerance was so high, she did not feel any benefit from drinking, and saw drinking as a waste of time.  Clinician inquired about Jacqueline Banks's emotional ratings today following sobriety, as well as any significant changes in thoughts, feelings, or behavior since previous session.  Jacqueline Banks reported scores of 6/10 for both depression, and anxiety, stating "I think my ratings are higher just because taking steps to change can make me uncomfortable".  She noted that it was a positive change to wake up without feeling guilty for drinking.  Clinician inquired about additional progress Jacqueline Banks has made towards goals in past week, as well as present challenges.  Jacqueline Banks reported that following conversation with clinician, she did end up speaking with her husband, and proposed they both contemplate 3 things the other could change to help one another and strengthen connection.  Jacqueline Banks reported that they took a week to think this over, and both presented suggestions, such as cutting  back on drinking and exercising for her, and going on walks together and sharing more chore duties for him.  Jacqueline Banks reported that her husband's follow-through was "Pleasantly surprising" and gave her hope  that change is possible for them.  Jacqueline Banks disclosed that one request from her husband was to stop reminding him about chores, but she often finds herself doing everything in the home without help, and foresees this leading to bottled up resentment.  Clinician explored strategies with Jacqueline Banks to address this issue with the family, including creating a list of household chores, holding a family meeting to go over this list, determining how chores will be distributed, and then putting this list somewhere visible in the home to increase accountability/follow through.  Jacqueline Banks reported that she did something similar in the past with good efficacy and was open to trying this approach again.  She stated "I think I am a little too controlling and need to practice letting go too".  Interventions were effective, as evidenced by Jacqueline Banks reporting that this session was helpful for unpacking and processing events from previous week, and stated "I have homework to focus on, like talking to my husband for clarity on our goals, creating the chore list, talking to my kids, and working on my sobriety".  Clinician encouraged Jacqueline Banks to also take time to consider positive activities to replace her drinking with following nighttime cessation, such as journaling or picking up a new hobby.  Clinician will continue to monitor.                        Plan: Meet again in 1 week.   Diagnosis: Major Depressive Disorder, Recurrent, Severe and Alcohol Use Disorder, Mild.   Jacqueline Flood, LCSW, LCAS 03/12/20

## 2020-04-04 ENCOUNTER — Telehealth (HOSPITAL_COMMUNITY): Payer: BC Managed Care – PPO | Admitting: Psychiatry

## 2020-04-11 ENCOUNTER — Other Ambulatory Visit: Payer: Self-pay

## 2020-04-11 ENCOUNTER — Telehealth (INDEPENDENT_AMBULATORY_CARE_PROVIDER_SITE_OTHER): Payer: BC Managed Care – PPO | Admitting: Psychiatry

## 2020-04-11 DIAGNOSIS — F331 Major depressive disorder, recurrent, moderate: Secondary | ICD-10-CM | POA: Diagnosis not present

## 2020-04-11 MED ORDER — VORTIOXETINE HBR 20 MG PO TABS: 20.0000 mg | ORAL_TABLET | Freq: Every day | ORAL | 1 refills | Status: DC

## 2020-04-11 NOTE — Progress Notes (Signed)
Virtual Visit via Telephone Note  I connected with Jacqueline Banks on 04/11/20 at  9:45 AM EDT by telephone and verified that I am speaking with the correct person using two identifiers.  Location: Patient: home Provider: office   I discussed the limitations, risks, security and privacy concerns of performing an evaluation and management service by telephone and the availability of in person appointments. I also discussed with the patient that there may be a patient responsible charge related to this service. The patient expressed understanding and agreed to proceed.   History of Present Illness: "Good". She is feeling better but it took a while. It doesn't seem as effective as before. She is better than the Zoloft but feels she could feel better. Jacqueline Banks feels happier and is more present. She is still struggling with doing things. Her motivation to do things for herself is low. Her self esteem is on the low side but seems to be getting a little better. Jacqueline Banks is drinking 2 large glasses of wine a night or nothing. She denies SI/HI.   Observations/Objective:  General Appearance: unable to assess  Eye Contact:  unable to assess  Speech:  Clear and Coherent and Normal Rate  Volume:  Normal  Mood:  Depressed  Affect:  Full Range  Thought Process:  Goal Directed, Linear and Descriptions of Associations: Intact  Orientation:  Full (Time, Place, and Person)  Thought Content:  Logical  Suicidal Thoughts:  No  Homicidal Thoughts:  No  Memory:  Immediate;   Good  Judgement:  Good  Insight:  Good  Psychomotor Activity: unable to assess  Concentration:  Concentration: Good  Recall:  Good  Fund of Knowledge:  Good  Language:  Good  Akathisia:  unable to assess  Handed:  Right  AIMS (if indicated):     Assets:  Communication Skills Desire for Improvement Financial Resources/Insurance Housing Resilience Social Support Talents/Skills Transportation Vocational/Educational  ADL's:   unable to assess  Cognition:  WNL  Sleep:         Assessment and Plan: MDD- recurrent, moderate  Increase Trintillix 20mg  po qD  Encouraged to try therapy again  Follow Up Instructions: In 2 months or sooner if needed   I discussed the assessment and treatment plan with the patient. The patient was provided an opportunity to ask questions and all were answered. The patient agreed with the plan and demonstrated an understanding of the instructions.   The patient was advised to call back or seek an in-person evaluation if the symptoms worsen or if the condition fails to improve as anticipated.  I provided 20 minutes of non-face-to-face time during this encounter.   Charlcie Cradle, MD

## 2020-04-29 ENCOUNTER — Other Ambulatory Visit (HOSPITAL_COMMUNITY): Payer: Self-pay | Admitting: Psychiatry

## 2020-04-29 DIAGNOSIS — F331 Major depressive disorder, recurrent, moderate: Secondary | ICD-10-CM

## 2020-05-06 DIAGNOSIS — Z20822 Contact with and (suspected) exposure to covid-19: Secondary | ICD-10-CM | POA: Diagnosis not present

## 2020-05-30 ENCOUNTER — Encounter (HOSPITAL_COMMUNITY): Payer: Self-pay | Admitting: Psychiatry

## 2020-05-30 ENCOUNTER — Other Ambulatory Visit: Payer: Self-pay

## 2020-05-30 ENCOUNTER — Telehealth (HOSPITAL_COMMUNITY): Payer: BC Managed Care – PPO | Admitting: Psychiatry

## 2020-05-30 DIAGNOSIS — F331 Major depressive disorder, recurrent, moderate: Secondary | ICD-10-CM

## 2020-05-30 NOTE — Progress Notes (Signed)
Virtual Visit via Telephone Note  I connected with Jacqueline Banks on 05/30/2020 by a telephone enabled telemedicine application and verified that I am speaking with the correct person using two identifiers.  Location: Patient: home Provider: office   I discussed the limitations of evaluation and management by telemedicine and the availability of in person appointments. The patient expressed understanding and agreed to proceed.  History of Present Illness: Pt shares she is depressed. Her father is dying. She is also experiencing menopausal symptoms. She denies SI/HI.    Observations/Objective:  General Appearance: unable to assess  Eye Contact:  unable to assess  Speech:  Clear and Coherent and Normal Rate  Volume:  Normal  Mood:  Depressed  Affect:  Congruent  Thought Process:  Goal Directed, Linear, and Descriptions of Associations: Intact  Orientation:  Full (Time, Place, and Person)  Thought Content:  Logical  Suicidal Thoughts:  No  Homicidal Thoughts:  No  Memory:  Immediate;   Good  Judgement:  Good  Insight:  Good  Psychomotor Activity: unable to assess  Concentration:  Concentration: Good  Recall:  Good  Fund of Knowledge:  Good  Language:  Good  Akathisia:  unable to assess  Handed:  unable to assess  AIMS (if indicated):     Assets:  Communication Skills Desire for Improvement Financial Resources/Insurance Housing Social Support Talents/Skills Transportation Vocational/Educational  ADL's:  unable to assess  Cognition:  WNL  Sleep:         Assessment and Plan:  Status of current problems: worsening depression    Meds:  1. MDD (major depressive disorder), recurrent episode, moderate (HCC)  - start Wellbutrin  Recommended she talk to her gynecologist for hormonal replacement therapy   Therapy: brief supportive therapy provided. Discussed psychosocial stressors in detail.      Patient/Guardian was advised Release of Information must be  obtained prior to any record release in order to collaborate their care with an outside provider. Patient/Guardian was advised if they have not already done so to contact the registration department to sign all necessary forms in order for [place] to release information regarding their care.   Consent: Patient/Guardian gives verbal consent for treatment and assignment of benefits for services provided during this visit. Patient/Guardian expressed understanding and agreed to proceed.     Follow Up Instructions: Follow up in 1 month or sooner if needed    I discussed the assessment and treatment plan with the patient. The patient was provided an opportunity to ask questions and all were answered. The patient agreed with the plan and demonstrated an understanding of the instructions.   The patient was advised to call back or seek an in-person evaluation if the symptoms worsen or if the condition fails to improve as anticipated.  I provided 20  minutes of non-face-to-face time during this encounter.   Oletta Darter, MD

## 2020-06-03 ENCOUNTER — Telehealth (HOSPITAL_COMMUNITY): Payer: Self-pay | Admitting: *Deleted

## 2020-06-03 MED ORDER — VORTIOXETINE HBR 20 MG PO TABS: 20.0000 mg | ORAL_TABLET | Freq: Every day | ORAL | 1 refills | Status: DC

## 2020-06-03 MED ORDER — BUPROPION HCL ER (XL) 150 MG PO TB24: 150.0000 mg | ORAL_TABLET | ORAL | 1 refills | Status: DC

## 2020-06-03 NOTE — Telephone Encounter (Signed)
Patient called and stated she had an appointment 9/16 and Trintellix was not sent to pharmacy.  She also stated that a new med was to be added and she thinks it was Wellbutrin.  I could not find a note for that visit.  Please review and send scripts to pharmacy.

## 2020-06-13 ENCOUNTER — Telehealth (HOSPITAL_COMMUNITY): Payer: BC Managed Care – PPO | Admitting: Psychiatry

## 2020-06-27 DIAGNOSIS — N898 Other specified noninflammatory disorders of vagina: Secondary | ICD-10-CM | POA: Diagnosis not present

## 2020-06-27 DIAGNOSIS — Z6823 Body mass index (BMI) 23.0-23.9, adult: Secondary | ICD-10-CM | POA: Diagnosis not present

## 2020-06-27 DIAGNOSIS — Z01419 Encounter for gynecological examination (general) (routine) without abnormal findings: Secondary | ICD-10-CM | POA: Diagnosis not present

## 2020-06-27 DIAGNOSIS — Z1231 Encounter for screening mammogram for malignant neoplasm of breast: Secondary | ICD-10-CM | POA: Diagnosis not present

## 2020-06-27 LAB — HM MAMMOGRAPHY

## 2020-07-16 ENCOUNTER — Encounter: Payer: Self-pay | Admitting: Internal Medicine

## 2020-07-30 ENCOUNTER — Other Ambulatory Visit (HOSPITAL_COMMUNITY): Payer: Self-pay | Admitting: Psychiatry

## 2020-07-30 DIAGNOSIS — F331 Major depressive disorder, recurrent, moderate: Secondary | ICD-10-CM

## 2020-08-16 ENCOUNTER — Telehealth (HOSPITAL_COMMUNITY): Payer: Self-pay | Admitting: *Deleted

## 2020-08-16 NOTE — Telephone Encounter (Signed)
VM left on writers phone about 215 pm needing Trintellex. Sh is a patient at the [place] office of Dr Doyne Keel. She is needing medicine to get thru the weekend but at the time of her call [place] office closed till mon at 8am. Will leave a message for the Dr but may not be seen till Monday.

## 2020-08-19 ENCOUNTER — Other Ambulatory Visit (HOSPITAL_COMMUNITY): Payer: Self-pay | Admitting: Psychiatry

## 2020-08-19 ENCOUNTER — Telehealth (HOSPITAL_COMMUNITY): Payer: Self-pay | Admitting: *Deleted

## 2020-08-19 DIAGNOSIS — F331 Major depressive disorder, recurrent, moderate: Secondary | ICD-10-CM

## 2020-08-19 NOTE — Telephone Encounter (Signed)
Opened in error

## 2020-08-19 NOTE — Telephone Encounter (Signed)
Pt called for refills Wellbutrin and Trintellix. Last filled in September with 1 refill on each. Pt has an appointment on 08/22/20.

## 2020-08-20 ENCOUNTER — Other Ambulatory Visit (HOSPITAL_COMMUNITY): Payer: Self-pay | Admitting: Psychiatry

## 2020-08-20 ENCOUNTER — Telehealth (HOSPITAL_COMMUNITY): Payer: Self-pay | Admitting: *Deleted

## 2020-08-20 ENCOUNTER — Other Ambulatory Visit (HOSPITAL_COMMUNITY): Payer: Self-pay | Admitting: *Deleted

## 2020-08-20 DIAGNOSIS — F331 Major depressive disorder, recurrent, moderate: Secondary | ICD-10-CM

## 2020-08-20 MED ORDER — VORTIOXETINE HBR 20 MG PO TABS: 20.0000 mg | ORAL_TABLET | Freq: Every day | ORAL | 0 refills | Status: DC

## 2020-08-20 MED ORDER — BUPROPION HCL ER (XL) 150 MG PO TB24: 150.0000 mg | ORAL_TABLET | ORAL | 0 refills | Status: DC

## 2020-08-20 NOTE — Telephone Encounter (Signed)
Patient requests refill on all medications.

## 2020-08-20 NOTE — Telephone Encounter (Signed)
Done by Dr Doyne Keel.

## 2020-08-22 ENCOUNTER — Telehealth (HOSPITAL_COMMUNITY): Payer: BC Managed Care – PPO | Admitting: Psychiatry

## 2020-08-22 ENCOUNTER — Other Ambulatory Visit: Payer: Self-pay

## 2020-08-22 ENCOUNTER — Encounter (HOSPITAL_COMMUNITY): Payer: Self-pay | Admitting: Psychiatry

## 2020-08-22 DIAGNOSIS — F331 Major depressive disorder, recurrent, moderate: Secondary | ICD-10-CM

## 2020-08-22 MED ORDER — BUPROPION HCL ER (XL) 150 MG PO TB24: 150.0000 mg | ORAL_TABLET | ORAL | 0 refills | Status: DC

## 2020-08-22 MED ORDER — VORTIOXETINE HBR 20 MG PO TABS: 20.0000 mg | ORAL_TABLET | Freq: Every day | ORAL | 0 refills | Status: DC

## 2020-08-22 NOTE — Progress Notes (Unsigned)
Virtual Visit via Telephone Note  I connected with Jacqueline Banks on 08/22/20 at 11:00 AM EST by telephone and verified that I am speaking with the correct person using two identifiers.  Location: Patient: home Provider: office   I discussed the limitations, risks, security and privacy concerns of performing an evaluation and management service by telephone and the availability of in person appointments. I also discussed with the patient that there may be a patient responsible charge related to this service. The patient expressed understanding and agreed to proceed.   History of Present Illness: "I am great. I am feeling good". [name] feels back to her normal self and better than she has felt in years. After starting Wellbutrin with Trintellix she noticed a big improvement in mood. She ran out of both meds for about 1 week [name] noticed a drop in her mood with more tearfulness. She restarted both meds 3 days ago and is starting to feel better. [name] is handling things better and is no longer easily overwhelmed. [name] is sleep is well. She is active, motivated and getting back into activities. [name] is no longer having racing thoughts and her mind feels "peaceful". She denies SI/HI.    Observations/Objective:  General Appearance: unable to assess  Eye Contact:  unable to assess  Speech:  {Speech:22685}  Volume:  {Volume (PAA):22686}  Mood:  {BHH MOOD:22306}  Affect:  {Affect (PAA):22687}  Thought Process:  {Thought Process (PAA):22688}  Orientation:  {BHH ORIENTATION (PAA):22689}  Thought Content:  {Thought Content:22690}  Suicidal Thoughts:  {ST/HT (PAA):22692}  Homicidal Thoughts:  {ST/HT (PAA):22692}  Memory:  {BHH MEMORY:22881}  Judgement:  {Judgement (PAA):22694}  Insight:  {Insight (PAA):22695}  Psychomotor Activity: unable to assess  Concentration:  {Concentration:21399}  Recall:  {BHH GOOD/FAIR/POOR:22877}  Fund of Knowledge:  {BHH GOOD/FAIR/POOR:22877}  Language:  {BHH  GOOD/FAIR/POOR:22877}  Akathisia:  unable to assess  Handed:  {Handed:22697}  AIMS (if indicated):     Assets:  {Assets (PAA):22698}  ADL's:  unable to assess  Cognition:  {chl bhh cognition:304700322}  Sleep:         Assessment and Plan: MDD- recurrent, moderate  Status of current symptoms: significant improvement in mood  Continue: Wellbutrin XL 150mg  po qD Trintellix 20mg  po qD    Follow Up Instructions: In 4 months or sooner if needed   I discussed the assessment and treatment plan with the patient. The patient was provided an opportunity to ask questions and all were answered. The patient agreed with the plan and demonstrated an understanding of the instructions.   The patient was advised to call back or seek an in-person evaluation if the symptoms worsen or if the condition fails to improve as anticipated.  I provided 15 minutes of non-face-to-face time during this encounter.   Charlcie Cradle, MD

## 2020-10-09 ENCOUNTER — Ambulatory Visit: Payer: BC Managed Care – PPO | Admitting: Internal Medicine

## 2020-10-09 ENCOUNTER — Encounter: Payer: Self-pay | Admitting: Internal Medicine

## 2020-10-09 NOTE — Progress Notes (Signed)
     C  A  N  C  E  L  L  E  D   Cancelled

## 2020-10-18 NOTE — Progress Notes (Signed)
Complete Physical  Assessment and Plan:  Jacqueline Banks was seen today for annual exam.  Diagnoses and all orders for this visit:  Encounter for routine adult health examination without abnormal findings  Moderate episode of recurrent major depressive disorder ([place]) Well controlled with current meds; psych is following Lifestyle discussed: diet/exerise, sleep hygiene, stress management, hydration  Hyperlipidemia, unspecified hyperlipidemia type Mild elevations; otherwise low risk hx; managing with lifestyle  Continue low cholesterol diet and exercise.  Check lipid panel.  -     Lipid panel -     TSH  Vitamin D deficiency -     VITAMIN D 25 Hydroxy (Vit-D Deficiency, Fractures)  BMI 22.0-22.9, adult Continue to recommend diet heavy in fruits and veggies and low in animal meats, cheeses, and dairy products, appropriate calorie intake Discuss exercise recommendations routinely Continue to monitor weight at each visit  B12 deficiency On supplement; ? R/t previous alcohol intake, would like to trial off if levels are improved -     Vitamin B12  Medication management -     CBC with Differential/Platelet -     COMPLETE METABOLIC PANEL WITH GFR -     Magnesium -     Urinalysis, Routine w reflex microscopic  Screening for thyroid disorder -     TSH  Screening for hematuria or proteinuria -     Urinalysis, Routine w reflex microscopic  Screening for colon cancer -     Ambulatory referral to Gastroenterology  Screening for diabetes mellitus -     Hemoglobin A1c  Screening for cardiovascular condition -     EKG 12-Lead  Need for vaccine for Td (tetanus-diphtheria) -     Td : Tetanus/diphtheria >7yo Preservative  free  Orders Placed This Encounter  Procedures  . Td : Tetanus/diphtheria >7yo Preservative  free  . CBC with Differential/Platelet  . COMPLETE METABOLIC PANEL WITH GFR  . Magnesium  . Lipid panel  . TSH  . Hemoglobin A1c  . VITAMIN D 25 Hydroxy (Vit-D Deficiency,  Fractures)  . Vitamin B12  . Urinalysis, Routine w reflex microscopic  . Ambulatory referral to Gastroenterology  . EKG 12-Lead    Discussed med's effects and SE's. Screening labs and tests as requested with regular follow-up as recommended. Over 40 minutes of exam, counseling, chart review, and complex, high level critical decision making was performed this visit.   Future Appointments  Date Time Provider [place]  12/12/2020 11:00 AM Charlcie Cradle, MD [facility] None  10/21/2021 10:00 AM Liane Comber, NP [facility] None     HPI  55 y.o. female  presents for a complete physical. Jacqueline Banks  has Vitamin D deficiency; Major depression, recurrent ([name]); and Hyperlipidemia on their problem list.   Jacqueline Banks is married, 2 children 39 y/o and 54 y/o, doing well in school. 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 No concerns today.   Jacqueline Banks follows annually with GYN, Dr. [name] at [facility].   Jacqueline Banks is recently followed by Dr. [name], psych for recurrent MDD, persistent following brother passing in 2017, currently prescribed trintellix 20 mg, wellbutrin 150 mg daily and reports is doing well.   BMI is Body mass index is 22.76 kg/m., Jacqueline Banks has been working on diet, does walk dog 30 min daily.  Alcohol - 2 glasses per day on the weekend Water - 1 bottle per day, rare tea, no soda Caffeine - 1 cup coffee  Minimial red meat, more chicken, lots of fruit, 1 serving veggie daily with dinner  Wt Readings from Last 3 Encounters:  10/21/20 153 lb (69.4 kg)  09/19/19 145 lb 12.8 oz (66.1 kg)  08/14/19 143 lb (64.9 kg)   Today their BP is BP: 110/70 Jacqueline Banks does workout. Jacqueline Banks denies chest pain, shortness of breath, dizziness.    Jacqueline Banks is not on cholesterol medication and denies myalgias. Her cholesterol is not at goal. The cholesterol last visit was:   Lab Results  Component Value Date   CHOL 215 (H) 09/19/2019   HDL 94 09/19/2019   LDLCALC 105 (H) 09/19/2019   TRIG 71 09/19/2019   CHOLHDL 2.3  09/19/2019   Jacqueline Banks has been working on diet and exercise for glucose management; Jacqueline Banks denies diabetic polys. Last A1C in the office was:  Lab Results  Component Value Date   HGBA1C 5.1 09/19/2019   Last GFR: Lab Results  Component Value Date   GFRNONAA 85 09/19/2019   Patient is on Vitamin D supplement, taking 2 caps daily, unsure of dose.   Lab Results  Component Value Date   VD25OH 33 09/19/2019     Was taking B12 supplement, still taking daily supplement.  Lab Results  Component Value Date   [id number] 814 09/19/2019     Current Medications:  Current Outpatient Medications on File Prior to Visit  Medication Sig Dispense Refill  . BIOTIN PO Take 1 capsule by mouth daily.    Marland Kitchen buPROPion (WELLBUTRIN XL) 150 MG 24 hr tablet Take 1 tablet (150 mg total) by mouth every morning. 90 tablet 0  . VITAMIN D PO Take 1,000 Units by mouth 2 (two) times daily.    Marland Kitchen vortioxetine HBr (TRINTELLIX) 20 MG TABS tablet Take 1 tablet (20 mg total) by mouth daily. 90 tablet 0   No current facility-administered medications on file prior to visit.   Allergies:  No Known Allergies Medical History:  Jacqueline Banks has Vitamin D deficiency; Major depression, recurrent ([place]); and Hyperlipidemia on their problem list. Health Maintenance:   Immunization History  Administered Date(s) Administered  . Influenza Inj Mdck Quad With Preservative 07/13/2017, 08/04/2018  . Td 10/21/2020  . Tdap 09/14/2010    Tetanus:09/2010 TODAY Pneumovax: n/a Prevnar 13: n/a Flu vaccine: declines Shingrix: declines  Covid 19: declines    Pap: follows GYN annually, unsure when last - report reqeusted MGM:06/27/2020 DEXA: n/a  Colonoscopy: had at age 79, overdue follow up- referred  EGD:  Last Dental Exam: goes q17m, last 2021 Last Eye Exam: 2021, remote lasik, goes annually Last Derm Exam: Dr. Renda Rolls, goes q50m   Patient Care Team: Unk Pinto, MD as PCP - General (Internal Medicine)  Surgical History:  Jacqueline Banks  has a past surgical history that includes Colonoscopy and LASIK (2002). Family History:  Herfamily history includes Anxiety disorder in her brother; Bipolar disorder in her sister; Diabetes in her father; Melanoma (age of onset: 23) in her mother; Obesity in her father. Social History:  Jacqueline Banks reports that Jacqueline Banks has never smoked. Jacqueline Banks has never used smokeless tobacco. Jacqueline Banks reports current alcohol use of about 4.0 standard drinks of alcohol per week. Jacqueline Banks reports that Jacqueline Banks does not use drugs.  Review of Systems: Review of Systems  Constitutional: Negative for malaise/fatigue and weight loss.  HENT: Negative for hearing loss and tinnitus.   Eyes: Negative for blurred vision and double vision.  Respiratory: Negative for cough, shortness of breath and wheezing.   Cardiovascular: Negative for chest pain, palpitations, orthopnea, claudication and leg swelling.  Gastrointestinal: Negative for abdominal pain, blood in stool, constipation, diarrhea,  heartburn, melena, nausea and vomiting.  Genitourinary: Negative.   Musculoskeletal: Negative for joint pain and myalgias.  Skin: Negative for rash.  Neurological: Negative for dizziness, tingling, sensory change, weakness and headaches.  Endo/Heme/Allergies: Negative for polydipsia.  Psychiatric/Behavioral: Negative.   All other systems reviewed and are negative.   Physical Exam: Estimated body mass index is 22.76 kg/m as calculated from the following:   Height as of this encounter: 5' 8.75" (1.746 m).   Weight as of this encounter: 153 lb (69.4 kg). BP 110/70   Pulse 69   Temp (!) 96.5 F (35.8 C)   Ht 5' 8.75" (1.746 m)   Wt 153 lb (69.4 kg)   SpO2 99%   BMI 22.76 kg/m  General Appearance: Well nourished, in no apparent distress.  Eyes: PERRLA, EOMs, conjunctiva no swelling or erythema Sinuses: No Frontal/maxillary tenderness  ENT/Mouth: Ext aud canals clear, normal light reflex with TMs without erythema, bulging. Good dentition. No erythema,  swelling, or exudate on post pharynx. Tonsils not swollen or erythematous. Hearing normal.  Neck: Supple, thyroid normal. No bruits  Respiratory: Respiratory effort normal, BS equal bilaterally without rales, rhonchi, wheezing or stridor.  Cardio: RRR without murmurs, rubs or gallops. Brisk peripheral pulses without edema.  Chest: symmetric, with normal excursions and percussion.  Breasts: Defer to GYN Abdomen: Soft, nontender, no guarding, rebound, hernias, masses, or organomegaly.  Lymphatics: Non tender without lymphadenopathy.  Genitourinary: Defer to GYN Musculoskeletal: Full ROM all peripheral extremities,5/5 strength, and normal gait.  Skin: Warm, dry without rashes, lesions, ecchymosis. Neuro: Cranial nerves intact, reflexes equal bilaterally. Normal muscle tone, no cerebellar symptoms. Sensation intact.  Psych: Awake and oriented X 3, normal affect, Insight and Judgment appropriate.   EKG: NSR, IRBBB  Gorden Harms Kaedance Magos 11:56 AM [facility]

## 2020-10-21 ENCOUNTER — Ambulatory Visit (INDEPENDENT_AMBULATORY_CARE_PROVIDER_SITE_OTHER): Payer: BC Managed Care – PPO | Admitting: Adult Health

## 2020-10-21 ENCOUNTER — Other Ambulatory Visit: Payer: Self-pay

## 2020-10-21 ENCOUNTER — Encounter: Payer: Self-pay | Admitting: Adult Health

## 2020-10-21 VITALS — BP 110/70 | HR 69 | Temp 96.5°F | Ht 68.75 in | Wt 153.0 lb

## 2020-10-21 DIAGNOSIS — Z131 Encounter for screening for diabetes mellitus: Secondary | ICD-10-CM

## 2020-10-21 DIAGNOSIS — Z1329 Encounter for screening for other suspected endocrine disorder: Secondary | ICD-10-CM | POA: Diagnosis not present

## 2020-10-21 DIAGNOSIS — Z136 Encounter for screening for cardiovascular disorders: Secondary | ICD-10-CM | POA: Diagnosis not present

## 2020-10-21 DIAGNOSIS — Z0000 Encounter for general adult medical examination without abnormal findings: Secondary | ICD-10-CM

## 2020-10-21 DIAGNOSIS — Z23 Encounter for immunization: Secondary | ICD-10-CM

## 2020-10-21 DIAGNOSIS — F331 Major depressive disorder, recurrent, moderate: Secondary | ICD-10-CM

## 2020-10-21 DIAGNOSIS — Z130 Encounter for screening for diseases of the blood and blood-forming organs and certain disorders involving the immune mechanism: Secondary | ICD-10-CM | POA: Diagnosis not present

## 2020-10-21 DIAGNOSIS — Z13220 Encounter for screening for lipoid disorders: Secondary | ICD-10-CM

## 2020-10-21 DIAGNOSIS — E559 Vitamin D deficiency, unspecified: Secondary | ICD-10-CM

## 2020-10-21 DIAGNOSIS — R7309 Other abnormal glucose: Secondary | ICD-10-CM

## 2020-10-21 DIAGNOSIS — Z1211 Encounter for screening for malignant neoplasm of colon: Secondary | ICD-10-CM

## 2020-10-21 DIAGNOSIS — Z1389 Encounter for screening for other disorder: Secondary | ICD-10-CM | POA: Diagnosis not present

## 2020-10-21 DIAGNOSIS — E538 Deficiency of other specified B group vitamins: Secondary | ICD-10-CM

## 2020-10-21 DIAGNOSIS — Z79899 Other long term (current) drug therapy: Secondary | ICD-10-CM | POA: Diagnosis not present

## 2020-10-21 DIAGNOSIS — E785 Hyperlipidemia, unspecified: Secondary | ICD-10-CM

## 2020-10-21 DIAGNOSIS — Z6822 Body mass index (BMI) 22.0-22.9, adult: Secondary | ICD-10-CM

## 2020-10-21 DIAGNOSIS — R030 Elevated blood-pressure reading, without diagnosis of hypertension: Secondary | ICD-10-CM | POA: Diagnosis not present

## 2020-10-21 NOTE — Patient Instructions (Addendum)
Jacqueline Banks , Thank you for taking time to come for your Annual Wellness Visit. I appreciate your ongoing commitment to your health goals. Please review the following plan we discussed and let me know if I can assist you in the future.   These are the goals we discussed: Goals    . DIET - EAT MORE FRUITS AND VEGETABLES    . DIET - INCREASE WATER INTAKE       This is a list of the screening recommended for you and due dates:  Health Maintenance  Topic Date Due  . Pap Smear  Never done  . Colon Cancer Screening  Never done  . Tetanus Vaccine  09/14/2020  . COVID-19 Vaccine (1) 11/06/2020*  . Flu Shot  12/12/2020*  . Mammogram  06/27/2021  .  Hepatitis C: One time screening is recommended by Center for Disease Control  (CDC) for  adults born from [birth year] through 1965.   Completed  . HIV Screening  Completed  *Topic was postponed. The date shown is not the original due date.    Consider K2 supplement for bone health, cardiovascular risk reduction - difficult to get in diet  Try to add 2-3 days of resistance/body weight exercises per week     Know what a healthy weight is for you (roughly BMI <25) and aim to maintain this  Aim for 7+ servings of fruits and vegetables daily  65-80+ fluid ounces of water or unsweet tea for healthy kidneys  Limit to max 1 drink of alcohol per day; avoid smoking/tobacco  Limit animal fats in diet for cholesterol and heart health - choose grass fed whenever available  Avoid highly processed foods, and foods high in saturated/trans fats  Aim for low stress - take time to unwind and care for your mental health  Aim for 150 min of moderate intensity exercise weekly for heart health, and weights twice weekly for bone health  Aim for 7-9 hours of sleep daily       Health Maintenance, Female Adopting a healthy lifestyle and getting preventive care are important in promoting health and wellness. Ask your health care provider about:  The right  schedule for you to have regular tests and exams.  Things you can do on your own to prevent diseases and keep yourself healthy. What should I know about diet, weight, and exercise? Eat a healthy diet  Eat a diet that includes plenty of vegetables, fruits, low-fat dairy products, and lean protein.  Do not eat a lot of foods that are high in solid fats, added sugars, or sodium.   Maintain a healthy weight Body mass index (BMI) is used to identify weight problems. It estimates body fat based on height and weight. Your health care provider can help determine your BMI and help you achieve or maintain a healthy weight. Get regular exercise Get regular exercise. This is one of the most important things you can do for your health. Most adults should:  Exercise for at least 150 minutes each week. The exercise should increase your heart rate and make you sweat (moderate-intensity exercise).  Do strengthening exercises at least twice a week. This is in addition to the moderate-intensity exercise.  Spend less time sitting. Even light physical activity can be beneficial. Watch cholesterol and blood lipids Have your blood tested for lipids and cholesterol at 55 years of age, then have this test every 5 years. Have your cholesterol levels checked more often if:  Your lipid or cholesterol levels  are high.  You are older than 55 years of age.  You are at high risk for heart disease. What should I know about cancer screening? Depending on your health history and family history, you may need to have cancer screening at various ages. This may include screening for:  Breast cancer.  Cervical cancer.  Colorectal cancer.  Skin cancer.  Lung cancer. What should I know about heart disease, diabetes, and high blood pressure? Blood pressure and heart disease  High blood pressure causes heart disease and increases the risk of stroke. This is more likely to develop in people who have high blood  pressure readings, are of African descent, or are overweight.  Have your blood pressure checked: ? Every 3-5 years if you are 33-22 years of age. ? Every year if you are 40 years old or older. Diabetes Have regular diabetes screenings. This checks your fasting blood sugar level. Have the screening done:  Once every three years after age 97 if you are at a normal weight and have a low risk for diabetes.  More often and at a younger age if you are overweight or have a high risk for diabetes. What should I know about preventing infection? Hepatitis B If you have a higher risk for hepatitis B, you should be screened for this virus. Talk with your health care provider to find out if you are at risk for hepatitis B infection. Hepatitis C Testing is recommended for:  Everyone born from [birth year] through 1965.  Anyone with known risk factors for hepatitis C. Sexually transmitted infections (STIs)  Get screened for STIs, including gonorrhea and chlamydia, if: ? You are sexually active and are younger than 55 years of age. ? You are older than 55 years of age and your health care provider tells you that you are at risk for this type of infection. ? Your sexual activity has changed since you were last screened, and you are at increased risk for chlamydia or gonorrhea. Ask your health care provider if you are at risk.  Ask your health care provider about whether you are at high risk for HIV. Your health care provider may recommend a prescription medicine to help prevent HIV infection. If you choose to take medicine to prevent HIV, you should first get tested for HIV. You should then be tested every 3 months for as long as you are taking the medicine. Pregnancy  If you are about to stop having your period (premenopausal) and you may become pregnant, seek counseling before you get pregnant.  Take 400 to 800 micrograms (mcg) of folic acid every day if you become pregnant.  Ask for birth control  (contraception) if you want to prevent pregnancy. Osteoporosis and menopause Osteoporosis is a disease in which the bones lose minerals and strength with aging. This can result in bone fractures. If you are 69 years old or older, or if you are at risk for osteoporosis and fractures, ask your health care provider if you should:  Be screened for bone loss.  Take a calcium or vitamin D supplement to lower your risk of fractures.  Be given hormone replacement therapy (HRT) to treat symptoms of menopause. Follow these instructions at home: Lifestyle  Do not use any products that contain nicotine or tobacco, such as cigarettes, e-cigarettes, and chewing tobacco. If you need help quitting, ask your health care provider.  Do not use street drugs.  Do not share needles.  Ask your health care provider for help if  you need support or information about quitting drugs. Alcohol use  Do not drink alcohol if: ? Your health care provider tells you not to drink. ? You are pregnant, may be pregnant, or are planning to become pregnant.  If you drink alcohol: ? Limit how much you use to 0-1 drink a day. ? Limit intake if you are breastfeeding.  Be aware of how much alcohol is in your drink. In the [place] one drink equals one 12 oz bottle of beer (355 mL), one 5 oz glass of wine (148 mL), or one 1 oz glass of hard liquor (44 mL). General instructions  Schedule regular health, dental, and eye exams.  Stay current with your vaccines.  Tell your health care provider if: ? You often feel depressed. ? You have ever been abused or do not feel safe at home. Summary  Adopting a healthy lifestyle and getting preventive care are important in promoting health and wellness.  Follow your health care provider's instructions about healthy diet, exercising, and getting tested or screened for diseases.  Follow your health care provider's instructions on monitoring your cholesterol and blood pressure. This  information is not intended to replace advice given to you by your health care provider. Make sure you discuss any questions you have with your health care provider. Document Revised: 08/24/2018 Document Reviewed: 08/24/2018 Elsevier Patient Education  2021 [employer].

## 2020-10-22 DIAGNOSIS — N904 Leukoplakia of vulva: Secondary | ICD-10-CM | POA: Diagnosis not present

## 2020-10-22 LAB — CBC WITH DIFFERENTIAL/PLATELET
Absolute Monocytes: 369 cells/uL (ref 200–950)
Basophils Absolute: 50 cells/uL (ref 0–200)
Basophils Relative: 1.1 %
Eosinophils Absolute: 162 cells/uL (ref 15–500)
Eosinophils Relative: 3.6 %
HCT: 38.1 % (ref 35.0–45.0)
Hemoglobin: 13 g/dL (ref 11.7–15.5)
Lymphs Abs: 2106 cells/uL (ref 850–3900)
MCH: 30.7 pg (ref 27.0–33.0)
MCHC: 34.1 g/dL (ref 32.0–36.0)
MCV: 89.9 fL (ref 80.0–100.0)
MPV: 10.4 fL (ref 7.5–12.5)
Monocytes Relative: 8.2 %
Neutro Abs: 1814 cells/uL (ref 1500–7800)
Neutrophils Relative %: 40.3 %
Platelets: 238 10*3/uL (ref 140–400)
RBC: 4.24 10*6/uL (ref 3.80–5.10)
RDW: 12 % (ref 11.0–15.0)
Total Lymphocyte: 46.8 %
WBC: 4.5 10*3/uL (ref 3.8–10.8)

## 2020-10-22 LAB — COMPLETE METABOLIC PANEL WITH GFR
AG Ratio: 1.6 (calc) (ref 1.0–2.5)
ALT: 12 U/L (ref 6–29)
AST: 17 U/L (ref 10–35)
Albumin: 4.6 g/dL (ref 3.6–5.1)
Alkaline phosphatase (APISO): 67 U/L (ref 37–153)
BUN: 15 mg/dL (ref 7–25)
CO2: 30 mmol/L (ref 20–32)
Calcium: 9.6 mg/dL (ref 8.6–10.4)
Chloride: 99 mmol/L (ref 98–110)
Creat: 0.76 mg/dL (ref 0.50–1.05)
GFR, Est African American: 103 mL/min/{1.73_m2} (ref >=60)
GFR, Est Non African American: 89 mL/min/{1.73_m2} (ref >=60)
Globulin: 2.9 g/dL (calc) (ref 1.9–3.7)
Glucose, Bld: 88 mg/dL (ref 65–99)
Potassium: 3.7 mmol/L (ref 3.5–5.3)
Sodium: 137 mmol/L (ref 135–146)
Total Bilirubin: 0.8 mg/dL (ref 0.2–1.2)
Total Protein: 7.5 g/dL (ref 6.1–8.1)

## 2020-10-22 LAB — URINALYSIS, ROUTINE W REFLEX MICROSCOPIC
Bacteria, UA: NONE SEEN /HPF
Bilirubin Urine: NEGATIVE
Glucose, UA: NEGATIVE
Hgb urine dipstick: NEGATIVE
Hyaline Cast: NONE SEEN /LPF
Ketones, ur: NEGATIVE
Nitrite: NEGATIVE
Protein, ur: NEGATIVE
RBC / HPF: NONE SEEN /HPF (ref 0–2)
Specific Gravity, Urine: 1.015 (ref 1.001–1.03)
pH: 7 (ref 5.0–8.0)

## 2020-10-22 LAB — LIPID PANEL
Cholesterol: 224 mg/dL — ABNORMAL HIGH (ref <200)
HDL: 80 mg/dL (ref >=50)
LDL Cholesterol (Calc): 127 mg/dL (calc) — ABNORMAL HIGH
Non-HDL Cholesterol (Calc): 144 mg/dL (calc) — ABNORMAL HIGH (ref <130)
Total CHOL/HDL Ratio: 2.8 (calc) (ref <5.0)
Triglycerides: 76 mg/dL (ref <150)

## 2020-10-22 LAB — VITAMIN B12: Vitamin B-12: 479 pg/mL (ref 200–1100)

## 2020-10-22 LAB — HEMOGLOBIN A1C
Hgb A1c MFr Bld: 5.3 % of total Hgb (ref <5.7)
Mean Plasma Glucose: 105 mg/dL
eAG (mmol/L): 5.8 mmol/L

## 2020-10-22 LAB — VITAMIN D 25 HYDROXY (VIT D DEFICIENCY, FRACTURES): Vit D, 25-Hydroxy: 77 ng/mL (ref 30–100)

## 2020-10-22 LAB — MAGNESIUM: Magnesium: 2 mg/dL (ref 1.5–2.5)

## 2020-10-22 LAB — TSH: TSH: 2.11 mIU/L

## 2020-10-30 ENCOUNTER — Encounter: Payer: Self-pay | Admitting: Adult Health

## 2020-10-30 ENCOUNTER — Other Ambulatory Visit: Payer: Self-pay | Admitting: Adult Health

## 2020-10-30 DIAGNOSIS — N904 Leukoplakia of vulva: Secondary | ICD-10-CM | POA: Insufficient documentation

## 2020-11-08 ENCOUNTER — Ambulatory Visit (AMBULATORY_SURGERY_CENTER): Payer: Self-pay

## 2020-11-08 ENCOUNTER — Other Ambulatory Visit: Payer: Self-pay

## 2020-11-08 VITALS — Ht 68.75 in | Wt 151.0 lb

## 2020-11-08 DIAGNOSIS — Z1211 Encounter for screening for malignant neoplasm of colon: Secondary | ICD-10-CM

## 2020-11-08 NOTE — Progress Notes (Signed)
No egg or soy allergy known to patient  No issues with past sedation with any surgeries or procedures--no past surgical hx No intubation problems in the past - no past surgical hx No FH of Malignant Hyperthermia No diet pills per patient No home 02 use per patient  No blood thinners per patient  Pt denies issues with constipation  No A fib or A flutter  EMMI video via MyChart  COVID 19 guidelines implemented in [place] today with Pt and RN   Pt denies loose or missing teeth, dentures, partials, dental implants, capped or bonded teeth;  Discussed with pt there will be an out-of-pocket cost for prep and that varies from $0 to 70 dollars  Due to the COVID-19 pandemic we are asking patients to follow certain guidelines.   Pt aware of COVID protocols and [facility] guidelines

## 2020-11-11 ENCOUNTER — Encounter: Payer: Self-pay | Admitting: Gastroenterology

## 2020-11-14 ENCOUNTER — Other Ambulatory Visit: Payer: Self-pay

## 2020-11-14 ENCOUNTER — Encounter: Payer: Self-pay | Admitting: Gastroenterology

## 2020-11-14 ENCOUNTER — Ambulatory Visit (AMBULATORY_SURGERY_CENTER): Payer: BC Managed Care – PPO | Admitting: Gastroenterology

## 2020-11-14 VITALS — BP 113/69 | HR 64 | Temp 97.7°F | Resp 13 | Ht 63.0 in | Wt 151.0 lb

## 2020-11-14 DIAGNOSIS — D124 Benign neoplasm of descending colon: Secondary | ICD-10-CM

## 2020-11-14 DIAGNOSIS — K621 Rectal polyp: Secondary | ICD-10-CM | POA: Diagnosis not present

## 2020-11-14 DIAGNOSIS — Z1211 Encounter for screening for malignant neoplasm of colon: Secondary | ICD-10-CM

## 2020-11-14 DIAGNOSIS — D121 Benign neoplasm of appendix: Secondary | ICD-10-CM

## 2020-11-14 DIAGNOSIS — N905 Atrophy of vulva: Secondary | ICD-10-CM | POA: Diagnosis not present

## 2020-11-14 DIAGNOSIS — N904 Leukoplakia of vulva: Secondary | ICD-10-CM | POA: Diagnosis not present

## 2020-11-14 DIAGNOSIS — D128 Benign neoplasm of rectum: Secondary | ICD-10-CM | POA: Diagnosis not present

## 2020-11-14 HISTORY — PX: COLONOSCOPY WITH PROPOFOL: SHX5780

## 2020-11-14 MED ORDER — SODIUM CHLORIDE 0.9 % IV SOLN: 500.0000 mL | Freq: Once | INTRAVENOUS | Status: DC

## 2020-11-14 NOTE — Patient Instructions (Signed)
YOU HAD AN ENDOSCOPIC PROCEDURE TODAY AT [facility]:   Refer to the procedure report that was given to you for any specific questions about what was found during the examination.  If the procedure report does not answer your questions, please call your gastroenterologist to clarify.  If you requested that your care partner not be given the details of your procedure findings, then the procedure report has been included in a sealed envelope for you to review at your convenience later.  YOU SHOULD EXPECT: Some feelings of bloating in the abdomen. Passage of more gas than usual.  Walking can help get rid of the air that was put into your GI tract during the procedure and reduce the bloating. If you had a lower endoscopy (such as a colonoscopy or flexible sigmoidoscopy) you may notice spotting of blood in your stool or on the toilet paper. If you underwent a bowel prep for your procedure, you may not have a normal bowel movement for a few days.  **Handouts on polyps and hemorrhoids**  Please Note:  You might notice some irritation and congestion in your nose or some drainage.  This is from the oxygen used during your procedure.  There is no need for concern and it should clear up in a day or so.  SYMPTOMS TO REPORT IMMEDIATELY:   Following lower endoscopy (colonoscopy or flexible sigmoidoscopy):  Excessive amounts of blood in the stool  Significant tenderness or worsening of abdominal pains  Swelling of the abdomen that is new, acute  Fever of 100F or higher   For urgent or emergent issues, a gastroenterologist can be reached at any hour by calling [phone]. Do not use MyChart messaging for urgent concerns.    DIET:  We do recommend a small meal at first, but then you may proceed to your regular diet.  Drink plenty of fluids but you should avoid alcoholic beverages for 24 hours.  ACTIVITY:  You should plan to take it easy for the rest of today and you should NOT DRIVE or  use heavy machinery until tomorrow (because of the sedation medicines used during the test).    FOLLOW UP: Our staff will call the number listed on your records 48-72 hours following your procedure to check on you and address any questions or concerns that you may have regarding the information given to you following your procedure. If we do not reach you, we will leave a message.  We will attempt to reach you two times.  During this call, we will ask if you have developed any symptoms of COVID 19. If you develop any symptoms (ie: fever, flu-like symptoms, shortness of breath, cough etc.) before then, please call [phone].  If you test positive for Covid 19 in the 2 weeks post procedure, please call and report this information to [place].    If any biopsies were taken you will be contacted by phone or by letter within the next 1-3 weeks.  Please call us at [phone] if you have not heard about the biopsies in 3 weeks.    SIGNATURES/CONFIDENTIALITY: You and/or your care partner have signed paperwork which will be entered into your electronic medical record.  These signatures attest to the fact that that the information above on your After Visit Summary has been reviewed and is understood.  Full responsibility of the confidentiality of this discharge information lies with you and/or your care-partner.

## 2020-11-14 NOTE — Progress Notes (Signed)
Report to PACU, RN, vss, BBS= Clear.  

## 2020-11-14 NOTE — Op Note (Addendum)
[facility] Patient Name: Jacqueline Banks Procedure Date: 11/14/2020 9:38 AM MRN: [id number] Endoscopist: Mauri Pole , MD Age: 55 Referring MD:  Date of Birth: [birth year] Gender: Female Account #: [id number] Procedure:                Colonoscopy Indications:              Screening for colorectal malignant neoplasm Medicines:                Monitored Anesthesia Care Procedure:                Pre-Anesthesia Assessment:                           - Prior to the procedure, a History and Physical                            was performed, and patient medications and                            allergies were reviewed. The patient's tolerance of                            previous anesthesia was also reviewed. The risks                            and benefits of the procedure and the sedation                            options and risks were discussed with the patient.                            All questions were answered, and informed consent                            was obtained. Prior Anticoagulants: The patient has                            taken no previous anticoagulant or antiplatelet                            agents. ASA Grade Assessment: II - A patient with                            mild systemic disease. After reviewing the risks                            and benefits, the patient was deemed in                            satisfactory condition to undergo the procedure.                           After obtaining informed consent, the colonoscope  was passed under direct vision. Throughout the                            procedure, the patient's blood pressure, pulse, and                            oxygen saturations were monitored continuously. The                            Olympus PCF-H190DL [id number]) Colonoscope was                            introduced through the anus and advanced to the the                            cecum,  identified by appendiceal orifice and                            ileocecal valve. The colonoscopy was performed                            without difficulty. The patient tolerated the                            procedure well. The quality of the bowel                            preparation was excellent. The ileocecal valve,                            appendiceal orifice, and rectum were photographed. Scope In: 9:52:14 AM Scope Out: 10:07:05 AM Scope Withdrawal Time: 0 hours 9 minutes 25 seconds  Total Procedure Duration: 0 hours 14 minutes 51 seconds  Findings:                 The perianal and digital rectal examinations were                            normal.                           A 5 mm polyp was found in the appendiceal orifice.                            The polyp was sessile. Biopsies were taken with a                            cold forceps for histology.                           A less than 1 mm polyp was found in the descending                            colon. The polyp was sessile. The polyp was removed  with a cold biopsy forceps. Resection and retrieval                            were complete.                           Two sessile polyps were found in the rectum. The                            polyps were 4 to 6 mm in size. These polyps were                            removed with a cold snare. Resection and retrieval                            were complete.                           Non-bleeding internal hemorrhoids were found during                            retroflexion. The hemorrhoids were medium-sized. Complications:            No immediate complications. Estimated Blood Loss:     Estimated blood loss was minimal. Impression:               - One 5 mm polyp at the appendiceal orifice.                            Biopsied.                           - One less than 1 mm polyp in the descending colon,                            removed  with a cold biopsy forceps. Resected and                            retrieved.                           - Two 4 to 6 mm polyps in the rectum, removed with                            a cold snare. Resected and retrieved.                           - Non-bleeding internal hemorrhoids. Recommendation:           - Patient has a contact number available for                            emergencies. The signs and symptoms of potential                            delayed  complications were discussed with the                            patient. Return to normal activities tomorrow.                            Written discharge instructions were provided to the                            patient.                           - Resume previous diet.                           - Continue present medications.                           - Await pathology results.                           - Will refer to surgery for extended appendectomy                            or plan for EMR if appendiceal polyp is an adenoma                           - Repeat colonoscopy in 3 - 10 years for                            surveillance based on pathology results.                           - Return to GI office at the next available                            appointment for hemorrhoidal band ligation. Mauri Pole, MD 11/14/2020 10:15:12 AM This report has been signed electronically.

## 2020-11-14 NOTE — Progress Notes (Signed)
Called to room to assist during endoscopic procedure.  Patient ID and intended procedure confirmed with present staff. Received instructions for my participation in the procedure from the performing physician.  

## 2020-11-18 ENCOUNTER — Telehealth: Payer: Self-pay

## 2020-11-18 NOTE — Telephone Encounter (Signed)
LVM

## 2020-11-27 ENCOUNTER — Encounter: Payer: BC Managed Care – PPO | Admitting: Internal Medicine

## 2020-12-02 ENCOUNTER — Encounter: Payer: BC Managed Care – PPO | Admitting: Gastroenterology

## 2020-12-03 ENCOUNTER — Encounter: Payer: Self-pay | Admitting: Gastroenterology

## 2020-12-12 ENCOUNTER — Telehealth (HOSPITAL_BASED_OUTPATIENT_CLINIC_OR_DEPARTMENT_OTHER): Payer: BC Managed Care – PPO | Admitting: Psychiatry

## 2020-12-12 ENCOUNTER — Other Ambulatory Visit: Payer: Self-pay

## 2020-12-12 DIAGNOSIS — F1010 Alcohol abuse, uncomplicated: Secondary | ICD-10-CM

## 2020-12-12 DIAGNOSIS — F331 Major depressive disorder, recurrent, moderate: Secondary | ICD-10-CM

## 2020-12-12 MED ORDER — VORTIOXETINE HBR 20 MG PO TABS: 20.0000 mg | ORAL_TABLET | Freq: Every day | ORAL | 1 refills | Status: DC

## 2020-12-12 MED ORDER — BUPROPION HCL ER (XL) 150 MG PO TB24: 150.0000 mg | ORAL_TABLET | ORAL | 1 refills | Status: DC

## 2020-12-12 NOTE — Progress Notes (Signed)
Virtual Visit via Telephone Note  I connected with Jacqueline Banks on 12/12/20 at 11:00 AM EDT by telephone and verified that I am speaking with the correct person using two identifiers.  Location: Patient: work Provider: office   I discussed the limitations, risks, security and privacy concerns of performing an evaluation and management service by telephone and the availability of in person appointments. I also discussed with the patient that there may be a patient responsible charge related to this service. The patient expressed understanding and agreed to proceed.   History of Present Illness: Jacqueline Banks states she is doing well. Her goal this year is to stop drinking alcohol and starting to exercise. She joined the gym last week. She feels "lighter and not so bogged down with sadness". She is sleeping well. Work and family life are good. She wants to keep working on self love. Jacqueline Banks denies SI/HI. In January she did not drink any alcohol at all. It was good. In February she started drinking a wine bottle on weekends. She woke up feeling sick. Jacqueline Banks stopped drinking on weekends as a result. She is going to do a dry April as she has realized with alcohol she has an "all or none" behavior. Jacqueline Banks feels like a success story and is moving in a positive direction. She would like to continue Wellbutrin and Trintellix. She went to her PCP in January and her BP was normal. Jacqueline Banks has not noticed any SE.    Observations/Objective:  General Appearance: unable to assess  Eye Contact:  unable to assess  Speech:  Clear and Coherent and Normal Rate  Volume:  Normal  Mood:  Euthymic  Affect:  Full Range  Thought Process:  Goal Directed, Linear, and Descriptions of Associations: Intact  Orientation:  Full (Time, Place, and Person)  Thought Content:  Logical  Suicidal Thoughts:  No  Homicidal Thoughts:  No  Memory:  Immediate;   Good  Judgement:  Good  Insight:  Good  Psychomotor Activity: unable to assess   Concentration:  Concentration: Good  Recall:  Good  Fund of Knowledge:  Good  Language:  Good  Akathisia:  unable to assess  Handed:  Right  AIMS (if indicated):     Assets:  Communication Skills Desire for Improvement Financial Resources/Insurance Housing Resilience Social Support Talents/Skills Transportation Vocational/Educational  ADL's:  unable to assess  Cognition:  WNL  Sleep:         Assessment and Plan: 1. MDD (major depressive disorder), recurrent episode, moderate (HCC) - buPROPion (WELLBUTRIN XL) 150 MG 24 hr tablet; Take 1 tablet (150 mg total) by mouth every morning.  Dispense: 90 tablet; Refill: 1 - vortioxetine HBr (TRINTELLIX) 20 MG TABS tablet; Take 1 tablet (20 mg total) by mouth daily.  Dispense: 90 tablet; Refill: 1  2. Alcohol use disorder, mild, abuse - monitoring her intake and practicing "dry" months    Follow Up Instructions: In 4-6 months or sooner if needed   I discussed the assessment and treatment plan with the patient. The patient was provided an opportunity to ask questions and all were answered. The patient agreed with the plan and demonstrated an understanding of the instructions.   The patient was advised to call back or seek an in-person evaluation if the symptoms worsen or if the condition fails to improve as anticipated.  I provided 11 minutes of non-face-to-face time during this encounter.   Charlcie Cradle, MD

## 2020-12-17 ENCOUNTER — Encounter: Payer: Self-pay | Admitting: Adult Health

## 2020-12-17 ENCOUNTER — Ambulatory Visit: Payer: Self-pay | Admitting: General Surgery

## 2020-12-17 DIAGNOSIS — D122 Benign neoplasm of ascending colon: Secondary | ICD-10-CM | POA: Diagnosis not present

## 2020-12-17 DIAGNOSIS — Z86010 Personal history of colonic polyps: Secondary | ICD-10-CM | POA: Insufficient documentation

## 2020-12-17 NOTE — H&P (Signed)
The patient is a 55 year old female who presents with a colonic polyp. 55 year old healthy female, status post recent colonoscopy which showed an appendiceal orifice polyp. Biopsy shows tubular adenoma. It is unclear if the entire polyp inside the appendix is removed. She is here today to discuss laparoscopic appendectomy.   Past Surgical History Janeann Forehand, CNA; 12/17/2020 11:55 AM) Colon Polyp Removal - Colonoscopy  Diagnostic Studies History Janeann Forehand, CNA; 12/17/2020 11:55 AM) Colonoscopy within last year Mammogram within last year Pap Smear 1-5 years ago  Allergies Janeann Forehand, CNA; 12/17/2020 11:55 AM) No Known Drug Allergies [12/17/2020]: Allergies Reconciled  Medication History Janeann Forehand, CNA; 12/17/2020 11:55 AM) buPROPion HCl ER (XL) (150MG  Tablet ER 24HR, Oral) Active. Clobetasol Propionate (0.05% Ointment, External) Active. Doxycycline Hyclate (100MG  Tablet, Oral) Active. Estradiol (0.1MG /GM Cream, Vaginal) Active. Eucrisa (2% Ointment, External) Active. Fluticasone Propionate (0.05% Cream, External) Active. Moxifloxacin HCl (0.5% Solution, Ophthalmic) Active. Sertraline HCl (50MG  Tablet, Oral) Active. Trintellix (10MG  Tablet, Oral) Active. Medications Reconciled  Social History Janeann Forehand, CNA; 12/17/2020 11:55 AM) Alcohol use Moderate alcohol use. Caffeine use Coffee. No drug use Tobacco use Former smoker.  Family History Janeann Forehand, CNA; 12/17/2020 11:55 AM) Colon Polyps Father. Melanoma Mother. Prostate Cancer Brother, Father.  Pregnancy / Birth History Janeann Forehand, CNA; 12/17/2020 11:55 AM) Age at menarche 57 years. Age of menopause 89-55 Gravida 5 Irregular periods Length (months) of breastfeeding 3-6 Maternal age >27 Para 2  Other Problems Janeann Forehand, CNA; 12/17/2020 11:55 AM) No pertinent past medical history     Review of Systems Janeann Forehand CNA; 12/17/2020 11:55  AM) General Not Present- Appetite Loss, Chills, Fatigue, Fever, Night Sweats, Weight Gain and Weight Loss. Skin Present- Dryness, Non-Healing Wounds and Rash. Not Present- Change in Wart/Mole, Hives, Jaundice, New Lesions and Ulcer. HEENT Not Present- Earache, Hearing Loss, Hoarseness, Nose Bleed, Oral Ulcers, Ringing in the Ears, Seasonal Allergies, Sinus Pain, Sore Throat, Visual Disturbances, Wears glasses/contact lenses and Yellow Eyes. Respiratory Not Present- Bloody sputum, Chronic Cough, Difficulty Breathing, Snoring and Wheezing. Breast Not Present- Breast Mass, Breast Pain, Nipple Discharge and Skin Changes. Cardiovascular Not Present- Chest Pain, Difficulty Breathing Lying Down, Leg Cramps, Palpitations, Rapid Heart Rate, Shortness of Breath and Swelling of Extremities. Gastrointestinal Present- Hemorrhoids. Not Present- Abdominal Pain, Bloating, Bloody Stool, Change in Bowel Habits, Chronic diarrhea, Constipation, Difficulty Swallowing, Excessive gas, Gets full quickly at meals, Indigestion, Nausea, Rectal Pain and Vomiting. Female Genitourinary Not Present- Frequency, Nocturia, Painful Urination, Pelvic Pain and Urgency. Musculoskeletal Not Present- Back Pain, Joint Pain, Joint Stiffness, Muscle Pain, Muscle Weakness and Swelling of Extremities. Neurological Not Present- Decreased Memory, Fainting, Headaches, Numbness, Seizures, Tingling, Tremor, Trouble walking and Weakness. Psychiatric Not Present- Anxiety, Bipolar, Change in Sleep Pattern, Depression, Fearful and Frequent crying. Endocrine Not Present- Cold Intolerance, Excessive Hunger, Hair Changes, Heat Intolerance, Hot flashes and New Diabetes. Hematology Not Present- Blood Thinners, Easy Bruising, Excessive bleeding, Gland problems, HIV and Persistent Infections.  Vitals (Donyelle Alston CNA; 12/17/2020 11:56 AM) 12/17/2020 11:55 AM Weight: 153 lb Height: 69in Body Surface Area: 1.84 m Body Mass Index: 22.59 kg/m  Temp.:  97.44F  Pulse: 98 (Regular)  P.OX: 98% (Room air) BP: 118/60(Sitting, Left Arm, Standard)        Physical Exam Leighton Ruff MD; 0/09/270 12:10 PM)  General Mental Status-Alert. General Appearance-Cooperative.  Abdomen Palpation/Percussion Palpation and Percussion of the abdomen reveal - Soft and Non Tender.    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 Biopsy shows his tubular adenoma. I recommended laparoscopic appendectomy to remove the entire appendix and make sure that the polyp is totally cleared. I discussed this with her in detail. We have discussed the risks of appendectomy including bleeding, damage to adjacent structures, staple line leak, infection and hernia. We discussed that all of these risks are very minimal. We discussed the risk of not doing surgery as well. All questions were answered. Patient agrees to proceed with surgery.

## 2021-01-08 ENCOUNTER — Encounter: Payer: BC Managed Care – PPO | Admitting: Gastroenterology

## 2021-02-05 NOTE — Progress Notes (Signed)
DUE TO COVID-19 ONLY ONE VISITOR IS ALLOWED TO COME WITH YOU AND STAY IN THE WAITING ROOM ONLY DURING PRE OP AND PROCEDURE DAY OF SURGERY. THE 1 VISITOR  MAY VISIT WITH YOU AFTER SURGERY IN YOUR PRIVATE ROOM DURING VISITING HOURS ONLY!  YOU NEED TO HAVE A COVID 19 TEST ON_______ @_______ , THIS TEST MUST BE DONE BEFORE SURGERY,  COVID TESTING SITE [place] [address], IT IS ON THE RIGHT GOING OUT [address] APPROXIMATELY  2 MINUTES PAST [employer] ON THE RIGHT. ONCE YOUR COVID TEST IS COMPLETED,  PLEASE BEGIN THE QUARANTINE INSTRUCTIONS AS OUTLINED IN YOUR HANDOUT.                Jacqueline Banks  02/05/2021   Your procedure is scheduled on:         02/20/2021   Report to [facility] Main  Entrance   Report to admitting at    1100 AM     Call this number if you have problems the morning of surgery [phone]    Remember: Do not eat food , candy gum or mints :After Midnight. You may have clear liquids from midnight until   1000am    CLEAR LIQUID DIET   Foods Allowed                                                                       Coffee and tea, regular and decaf                              Plain Jell-O any favor except red or purple                                            Fruit ices (not with fruit pulp)                                      Iced Popsicles                                     Carbonated beverages, regular and diet                                    Cranberry, grape and apple juices Sports drinks like Gatorade Lightly seasoned clear broth or consume(fat free) Sugar, honey syrup   _____________________________________________________________________    BRUSH YOUR TEETH MORNING OF SURGERY AND RINSE YOUR MOUTH OUT, NO CHEWING GUM CANDY OR MINTS.     Take these medicines the morning of surgery with A SIP OF WATER:                 Trintellix, wellbutrin  DO NOT TAKE ANY DIABETIC MEDICATIONS DAY OF YOUR  SURGERY  You may not have any metal on your body including hair pins and              piercings  Do not wear jewelry, make-up, lotions, powders or perfumes, deodorant             Do not wear nail polish on your fingernails.  Do not shave  48 hours prior to surgery.              Men may shave face and neck.   Do not bring valuables to the hospital. [place].  Contacts, dentures or bridgework may not be worn into surgery.  Leave suitcase in the car. After surgery it may be brought to your room.     Patients discharged the day of surgery will not be allowed to drive home. IF YOU ARE HAVING SURGERY AND GOING HOME THE SAME DAY, YOU MUST HAVE AN ADULT TO DRIVE YOU HOME AND BE WITH YOU FOR 24 HOURS. YOU MAY GO HOME BY TAXI OR UBER OR ORTHERWISE, BUT AN ADULT MUST ACCOMPANY YOU HOME AND STAY WITH YOU FOR 24 HOURS.  Name and phone number of your driver:  Special Instructions: N/A              Please read over the following fact sheets you were given: _____________________________________________________________________  [facility] - Preparing for Surgery Before surgery, you can play an important role.  Because skin is not sterile, your skin needs to be as free of germs as possible.  You can reduce the number of germs on your skin by washing with CHG (chlorahexidine gluconate) soap before surgery.  CHG is an antiseptic cleaner which kills germs and bonds with the skin to continue killing germs even after washing. Please DO NOT use if you have an allergy to CHG or antibacterial soaps.  If your skin becomes reddened/irritated stop using the CHG and inform your nurse when you arrive at Short Stay. Do not shave (including legs and underarms) for at least 48 hours prior to the first CHG shower.  You may shave your face/neck. Please follow these instructions carefully:  1.  Shower with CHG Soap the night before surgery and the   morning of Surgery.  2.  If you choose to wash your hair, wash your hair first as usual with your  normal  shampoo.  3.  After you shampoo, rinse your hair and body thoroughly to remove the  shampoo.                           4.  Use CHG as you would any other liquid soap.  You can apply chg directly  to the skin and wash                       Gently with a scrungie or clean washcloth.  5.  Apply the CHG Soap to your body ONLY FROM THE NECK DOWN.   Do not use on face/ open                           Wound or open sores. Avoid contact with eyes, ears mouth and genitals (private parts).  Wash face,  Genitals (private parts) with your normal soap.             6.  Wash thoroughly, paying special attention to the area where your surgery  will be performed.  7.  Thoroughly rinse your body with warm water from the neck down.  8.  DO NOT shower/wash with your normal soap after using and rinsing off  the CHG Soap.                9.  Pat yourself dry with a clean towel.            10.  Wear clean pajamas.            11.  Place clean sheets on your bed the night of your first shower and do not  sleep with pets. Day of Surgery : Do not apply any lotions/deodorants the morning of surgery.  Please wear clean clothes to the hospital/surgery center.  FAILURE TO FOLLOW THESE INSTRUCTIONS MAY RESULT IN THE CANCELLATION OF YOUR SURGERY PATIENT SIGNATURE_________________________________  NURSE SIGNATURE__________________________________  ________________________________________________________________________

## 2021-02-12 ENCOUNTER — Encounter (HOSPITAL_COMMUNITY)
Admission: RE | Admit: 2021-02-12 | Discharge: 2021-02-12 | Disposition: A | Discharge status: Home / Self Care | Payer: BC Managed Care – PPO | Source: Ambulatory Visit | Attending: General Surgery | Admitting: General Surgery

## 2021-02-12 ENCOUNTER — Other Ambulatory Visit: Payer: Self-pay

## 2021-02-12 ENCOUNTER — Encounter (HOSPITAL_COMMUNITY): Payer: Self-pay

## 2021-02-12 DIAGNOSIS — Z01812 Encounter for preprocedural laboratory examination: Secondary | ICD-10-CM | POA: Insufficient documentation

## 2021-02-12 LAB — CBC
HCT: 37.3 % (ref 36.0–46.0)
Hemoglobin: 12.8 g/dL (ref 12.0–15.0)
MCH: 31.1 pg (ref 26.0–34.0)
MCHC: 34.3 g/dL (ref 30.0–36.0)
MCV: 90.5 fL (ref 80.0–100.0)
Platelets: 224 10*3/uL (ref 150–400)
RBC: 4.12 MIL/uL (ref 3.87–5.11)
RDW: 11.8 % (ref 11.5–15.5)
WBC: 4.8 10*3/uL (ref 4.0–10.5)
nRBC: 0 % (ref 0.0–0.2)

## 2021-02-12 NOTE — Progress Notes (Addendum)
Anesthesia Review:  PCP: DR Unk Pinto LOv with Rayford Halsted on 10/21/20   Cardiologist : Chest x-ray : EKG : 10/21/20  Echo : Stress test: Cardiac Cath :  Activity level: can do a flight of stairs iwthout difficulty  Sleep Study/ CPAP : none  Fasting Blood Sugar :      / Checks Blood Sugar -- times a day:   Blood Thinner/ Instructions /Last Dose: ASA / Instructions/ Last Dose :

## 2021-02-17 ENCOUNTER — Other Ambulatory Visit (HOSPITAL_COMMUNITY)
Admission: RE | Admit: 2021-02-17 | Discharge: 2021-02-17 | Disposition: A | Discharge status: Home / Self Care | Payer: BC Managed Care – PPO | Source: Ambulatory Visit | Attending: General Surgery | Admitting: General Surgery

## 2021-02-17 DIAGNOSIS — Z01812 Encounter for preprocedural laboratory examination: Secondary | ICD-10-CM | POA: Insufficient documentation

## 2021-02-17 DIAGNOSIS — D121 Benign neoplasm of appendix: Secondary | ICD-10-CM | POA: Diagnosis not present

## 2021-02-17 DIAGNOSIS — Z87891 Personal history of nicotine dependence: Secondary | ICD-10-CM | POA: Diagnosis not present

## 2021-02-17 DIAGNOSIS — Z20822 Contact with and (suspected) exposure to covid-19: Secondary | ICD-10-CM | POA: Insufficient documentation

## 2021-02-17 DIAGNOSIS — Z79890 Hormone replacement therapy (postmenopausal): Secondary | ICD-10-CM | POA: Diagnosis not present

## 2021-02-17 DIAGNOSIS — Z79899 Other long term (current) drug therapy: Secondary | ICD-10-CM | POA: Diagnosis not present

## 2021-02-17 DIAGNOSIS — Z792 Long term (current) use of antibiotics: Secondary | ICD-10-CM | POA: Diagnosis not present

## 2021-02-17 DIAGNOSIS — Z8371 Family history of colonic polyps: Secondary | ICD-10-CM | POA: Diagnosis not present

## 2021-02-17 LAB — SARS CORONAVIRUS 2 (TAT 6-24 HRS): SARS Coronavirus 2: NEGATIVE

## 2021-02-20 ENCOUNTER — Ambulatory Visit (HOSPITAL_COMMUNITY): Payer: BC Managed Care – PPO | Admitting: Registered Nurse

## 2021-02-20 ENCOUNTER — Ambulatory Visit (HOSPITAL_COMMUNITY)
Admission: RE | Admit: 2021-02-20 | Discharge: 2021-02-20 | Disposition: A | Discharge status: Home / Self Care | Payer: BC Managed Care – PPO | Attending: General Surgery | Admitting: General Surgery

## 2021-02-20 ENCOUNTER — Encounter (HOSPITAL_COMMUNITY)
Admission: RE | Disposition: A | Discharge status: Home / Self Care | Payer: Self-pay | Source: Home / Self Care | Attending: General Surgery

## 2021-02-20 ENCOUNTER — Encounter (HOSPITAL_COMMUNITY): Payer: Self-pay | Admitting: General Surgery

## 2021-02-20 DIAGNOSIS — Z20822 Contact with and (suspected) exposure to covid-19: Secondary | ICD-10-CM | POA: Diagnosis not present

## 2021-02-20 DIAGNOSIS — F339 Major depressive disorder, recurrent, unspecified: Secondary | ICD-10-CM | POA: Diagnosis not present

## 2021-02-20 DIAGNOSIS — Z8371 Family history of colonic polyps: Secondary | ICD-10-CM | POA: Diagnosis not present

## 2021-02-20 DIAGNOSIS — Z79899 Other long term (current) drug therapy: Secondary | ICD-10-CM | POA: Diagnosis not present

## 2021-02-20 DIAGNOSIS — D121 Benign neoplasm of appendix: Secondary | ICD-10-CM | POA: Insufficient documentation

## 2021-02-20 DIAGNOSIS — E559 Vitamin D deficiency, unspecified: Secondary | ICD-10-CM | POA: Diagnosis not present

## 2021-02-20 DIAGNOSIS — Z79890 Hormone replacement therapy (postmenopausal): Secondary | ICD-10-CM | POA: Insufficient documentation

## 2021-02-20 DIAGNOSIS — K388 Other specified diseases of appendix: Secondary | ICD-10-CM | POA: Diagnosis not present

## 2021-02-20 DIAGNOSIS — Z87891 Personal history of nicotine dependence: Secondary | ICD-10-CM | POA: Insufficient documentation

## 2021-02-20 DIAGNOSIS — Z792 Long term (current) use of antibiotics: Secondary | ICD-10-CM | POA: Insufficient documentation

## 2021-02-20 DIAGNOSIS — E785 Hyperlipidemia, unspecified: Secondary | ICD-10-CM | POA: Diagnosis not present

## 2021-02-20 HISTORY — PX: LAPAROSCOPIC APPENDECTOMY: SHX408

## 2021-02-20 SURGERY — APPENDECTOMY, LAPAROSCOPIC
Anesthesia: General | Site: Abdomen

## 2021-02-20 MED ORDER — LACTATED RINGERS IV SOLN: INTRAVENOUS | Status: DC

## 2021-02-20 MED ORDER — ONDANSETRON HCL 4 MG/2ML IJ SOLN: 4.0000 mg | Freq: Once | INTRAMUSCULAR | Status: DC | PRN

## 2021-02-20 MED ORDER — SODIUM CHLORIDE 0.9 % IV SOLN
2.0000 g | INTRAVENOUS | Status: AC
  Administered 2021-02-20: 2 g via INTRAVENOUS
  Filled 2021-02-20: qty 2

## 2021-02-20 MED ORDER — DEXAMETHASONE SODIUM PHOSPHATE 10 MG/ML IJ SOLN
INTRAMUSCULAR | Status: AC
  Filled 2021-02-20: qty 1

## 2021-02-20 MED ORDER — PHENYLEPHRINE 40 MCG/ML (10ML) SYRINGE FOR IV PUSH (FOR BLOOD PRESSURE SUPPORT)
PREFILLED_SYRINGE | INTRAVENOUS | Status: DC | PRN
  Administered 2021-02-20 (×2): 80 ug via INTRAVENOUS

## 2021-02-20 MED ORDER — LACTATED RINGERS IR SOLN
Status: DC | PRN
  Administered 2021-02-20: 1000 mL

## 2021-02-20 MED ORDER — CELECOXIB 200 MG PO CAPS
200.0000 mg | ORAL_CAPSULE | ORAL | Status: AC
  Administered 2021-02-20: 200 mg via ORAL
  Filled 2021-02-20: qty 1

## 2021-02-20 MED ORDER — DROPERIDOL 2.5 MG/ML IJ SOLN
INTRAMUSCULAR | Status: DC | PRN
  Administered 2021-02-20: .625 mg via INTRAVENOUS

## 2021-02-20 MED ORDER — MIDAZOLAM HCL 5 MG/5ML IJ SOLN
INTRAMUSCULAR | Status: DC | PRN
  Administered 2021-02-20: 2 mg via INTRAVENOUS

## 2021-02-20 MED ORDER — PROPOFOL 10 MG/ML IV BOLUS
INTRAVENOUS | Status: DC | PRN
  Administered 2021-02-20: 150 mg via INTRAVENOUS

## 2021-02-20 MED ORDER — HYDROCODONE-ACETAMINOPHEN 5-325 MG PO TABS: 1.0000 | ORAL_TABLET | Freq: Four times a day (QID) | ORAL | 0 refills | Status: DC | PRN

## 2021-02-20 MED ORDER — ORAL CARE MOUTH RINSE: 15.0000 mL | Freq: Once | OROMUCOSAL | Status: AC

## 2021-02-20 MED ORDER — LIDOCAINE 2% (20 MG/ML) 5 ML SYRINGE
INTRAMUSCULAR | Status: DC | PRN
  Administered 2021-02-20: 60 mg via INTRAVENOUS

## 2021-02-20 MED ORDER — ONDANSETRON HCL 4 MG/2ML IJ SOLN
INTRAMUSCULAR | Status: AC
  Filled 2021-02-20: qty 2

## 2021-02-20 MED ORDER — PROPOFOL 10 MG/ML IV BOLUS
INTRAVENOUS | Status: AC
  Filled 2021-02-20: qty 20

## 2021-02-20 MED ORDER — ROCURONIUM BROMIDE 10 MG/ML (PF) SYRINGE
PREFILLED_SYRINGE | INTRAVENOUS | Status: AC
  Filled 2021-02-20: qty 10

## 2021-02-20 MED ORDER — FENTANYL CITRATE (PF) 100 MCG/2ML IJ SOLN: 25.0000 ug | INTRAMUSCULAR | Status: DC | PRN

## 2021-02-20 MED ORDER — SUGAMMADEX SODIUM 200 MG/2ML IV SOLN
INTRAVENOUS | Status: DC | PRN
  Administered 2021-02-20: 140 mg via INTRAVENOUS
  Administered 2021-02-20: 60 mg via INTRAVENOUS

## 2021-02-20 MED ORDER — BUPIVACAINE-EPINEPHRINE (PF) 0.25% -1:200000 IJ SOLN
INTRAMUSCULAR | Status: DC | PRN
  Administered 2021-02-20: 20 mL

## 2021-02-20 MED ORDER — BUPIVACAINE-EPINEPHRINE (PF) 0.25% -1:200000 IJ SOLN
INTRAMUSCULAR | Status: AC
  Filled 2021-02-20: qty 30

## 2021-02-20 MED ORDER — ONDANSETRON HCL 4 MG/2ML IJ SOLN
INTRAMUSCULAR | Status: DC | PRN
  Administered 2021-02-20: 4 mg via INTRAVENOUS

## 2021-02-20 MED ORDER — SODIUM CHLORIDE 0.9% FLUSH: 3.0000 mL | Freq: Two times a day (BID) | INTRAVENOUS | Status: DC

## 2021-02-20 MED ORDER — 0.9 % SODIUM CHLORIDE (POUR BTL) OPTIME
TOPICAL | Status: DC | PRN
  Administered 2021-02-20: 1000 mL

## 2021-02-20 MED ORDER — ROCURONIUM BROMIDE 10 MG/ML (PF) SYRINGE
PREFILLED_SYRINGE | INTRAVENOUS | Status: DC | PRN
  Administered 2021-02-20: 30 mg via INTRAVENOUS
  Administered 2021-02-20: 70 mg via INTRAVENOUS

## 2021-02-20 MED ORDER — OXYCODONE HCL 5 MG PO TABS: 5.0000 mg | ORAL_TABLET | Freq: Once | ORAL | Status: DC | PRN

## 2021-02-20 MED ORDER — FENTANYL CITRATE (PF) 100 MCG/2ML IJ SOLN
INTRAMUSCULAR | Status: AC
  Filled 2021-02-20: qty 2

## 2021-02-20 MED ORDER — OXYCODONE HCL 5 MG/5ML PO SOLN: 5.0000 mg | Freq: Once | ORAL | Status: DC | PRN

## 2021-02-20 MED ORDER — MIDAZOLAM HCL 2 MG/2ML IJ SOLN
INTRAMUSCULAR | Status: AC
  Filled 2021-02-20: qty 2

## 2021-02-20 MED ORDER — DEXAMETHASONE SODIUM PHOSPHATE 10 MG/ML IJ SOLN
INTRAMUSCULAR | Status: DC | PRN
  Administered 2021-02-20: 10 mg via INTRAVENOUS

## 2021-02-20 MED ORDER — ACETAMINOPHEN 500 MG PO TABS
1000.0000 mg | ORAL_TABLET | ORAL | Status: AC
  Administered 2021-02-20: 1000 mg via ORAL
  Filled 2021-02-20: qty 2

## 2021-02-20 MED ORDER — CHLORHEXIDINE GLUCONATE 0.12 % MT SOLN
15.0000 mL | Freq: Once | OROMUCOSAL | Status: AC
  Administered 2021-02-20: 15 mL via OROMUCOSAL

## 2021-02-20 MED ORDER — FENTANYL CITRATE (PF) 100 MCG/2ML IJ SOLN
INTRAMUSCULAR | Status: DC | PRN
  Administered 2021-02-20: 25 ug via INTRAVENOUS
  Administered 2021-02-20: 50 ug via INTRAVENOUS

## 2021-02-20 SURGICAL SUPPLY — 39 items
APPLIER CLIP 5 13 M/L LIGAMAX5 (MISCELLANEOUS)
APPLIER CLIP ROT 10 11.4 M/L (STAPLE)
CABLE HIGH FREQUENCY MONO STRZ (ELECTRODE) IMPLANT
CHLORAPREP W/TINT 26 (MISCELLANEOUS) ×3 IMPLANT
CLIP APPLIE 5 13 M/L LIGAMAX5 (MISCELLANEOUS) IMPLANT
CLIP APPLIE ROT 10 11.4 M/L (STAPLE) IMPLANT
COVER WAND RF STERILE (DRAPES) IMPLANT
DECANTER SPIKE VIAL GLASS SM (MISCELLANEOUS) IMPLANT
DERMABOND ADVANCED (GAUZE/BANDAGES/DRESSINGS) ×2
DERMABOND ADVANCED .7 DNX12 (GAUZE/BANDAGES/DRESSINGS) ×1 IMPLANT
DRAPE LAPAROSCOPIC ABDOMINAL (DRAPES) IMPLANT
ELECT REM PT RETURN 15FT ADLT (MISCELLANEOUS) ×3 IMPLANT
GLOVE SURG ENC MOIS LTX SZ6.5 (GLOVE) ×3 IMPLANT
GLOVE SURG UNDER POLY LF SZ7 (GLOVE) ×3 IMPLANT
GOWN STRL REUS W/TWL XL LVL3 (GOWN DISPOSABLE) ×6 IMPLANT
GRASPER SUT TROCAR 14GX15 (MISCELLANEOUS) ×3 IMPLANT
HANDLE STAPLE EGIA 4 XL (STAPLE) ×3 IMPLANT
IRRIG SUCT STRYKERFLOW 2 WTIP (MISCELLANEOUS) ×3
IRRIGATION SUCT STRKRFLW 2 WTP (MISCELLANEOUS) ×1 IMPLANT
KIT BASIN OR (CUSTOM PROCEDURE TRAY) ×3 IMPLANT
KIT TURNOVER KIT A (KITS) ×3 IMPLANT
MARKER SKIN DUAL TIP RULER LAB (MISCELLANEOUS) IMPLANT
PENCIL SMOKE EVACUATOR (MISCELLANEOUS) IMPLANT
POUCH SPECIMEN RETRIEVAL 10MM (ENDOMECHANICALS) ×3 IMPLANT
RELOAD EGIA 45 MED/THCK PURPLE (STAPLE) IMPLANT
RELOAD EGIA 45 TAN VASC (STAPLE) ×3 IMPLANT
RELOAD EGIA 60 MED/THCK PURPLE (STAPLE) ×3 IMPLANT
RELOAD EGIA 60 TAN VASC (STAPLE) IMPLANT
SCISSORS LAP 5X35 DISP (ENDOMECHANICALS) ×3 IMPLANT
SLEEVE XCEL OPT CAN 5 100 (ENDOMECHANICALS) ×3 IMPLANT
SUT VIC AB 2-0 SH 27 (SUTURE) ×3
SUT VIC AB 2-0 SH 27X BRD (SUTURE) ×1 IMPLANT
SUT VIC AB 4-0 PS2 27 (SUTURE) ×3 IMPLANT
SUT VICRYL 0 UR6 27IN ABS (SUTURE) IMPLANT
TOWEL OR 17X26 10 PK STRL BLUE (TOWEL DISPOSABLE) ×3 IMPLANT
TRAY FOLEY MTR SLVR 14FR STAT (SET/KITS/TRAYS/PACK) ×3 IMPLANT
TRAY LAPAROSCOPIC (CUSTOM PROCEDURE TRAY) ×3 IMPLANT
TROCAR BLADELESS OPT 5 100 (ENDOMECHANICALS) ×3 IMPLANT
TROCAR XCEL BLUNT TIP 100MML (ENDOMECHANICALS) ×3 IMPLANT

## 2021-02-20 NOTE — Anesthesia Preprocedure Evaluation (Signed)
Anesthesia Evaluation  Patient identified by MRN, date of birth, ID band Patient awake    Reviewed: Allergy & Precautions, NPO status , Patient's Chart, lab work & pertinent test results  Airway Mallampati: II  TM Distance: >3 FB Neck ROM: Full    Dental no notable dental hx.    Pulmonary neg pulmonary ROS,    Pulmonary exam normal breath sounds clear to auscultation       Cardiovascular negative cardio ROS Normal cardiovascular exam Rhythm:Regular Rate:Normal     Neuro/Psych negative neurological ROS  negative psych ROS   GI/Hepatic negative GI ROS, Neg liver ROS,   Endo/Other  negative endocrine ROS  Renal/GU negative Renal ROS  negative genitourinary   Musculoskeletal negative musculoskeletal ROS (+)   Abdominal   Peds negative pediatric ROS (+)  Hematology negative hematology ROS (+)   Anesthesia Other Findings   Reproductive/Obstetrics negative OB ROS                             Anesthesia Physical Anesthesia Plan  ASA: 2  Anesthesia Plan: General   Post-op Pain Management:    Induction: Intravenous  PONV Risk Score and Plan: 3 and Ondansetron, Dexamethasone and Treatment may vary due to age or medical condition  Airway Management Planned: Oral ETT  Additional Equipment:   Intra-op Plan:   Post-operative Plan: Extubation in OR  Informed Consent: I have reviewed the patients History and Physical, chart, labs and discussed the procedure including the risks, benefits and alternatives for the proposed anesthesia with the patient or authorized representative who has indicated his/her understanding and acceptance.     Dental advisory given  Plan Discussed with: CRNA and Surgeon  Anesthesia Plan Comments:         Anesthesia Quick Evaluation

## 2021-02-20 NOTE — Discharge Instructions (Signed)
LAPAROSCOPIC SURGERY: POST OP INSTRUCTIONS  DIET: Follow a light bland diet the first 24 hours after arrival home, such as soup, liquids, crackers, etc.  Be sure to include lots of fluids daily.  Avoid fast food or heavy meals as your are more likely to get nauseated.  Eat a low fat the next few days after surgery.   Take your usually prescribed home medications unless otherwise directed. PAIN CONTROL: Pain is best controlled by a usual combination of three different methods TOGETHER: Ice/Heat Over the counter pain medication Prescription pain medication Most patients will experience some swelling and bruising around the incisions.  Ice packs or heating pads (30-60 minutes up to 6 times a day) will help. Use ice for the first few days to help decrease swelling and bruising, then switch to heat to help relax tight/sore spots and speed recovery.  Some people prefer to use ice alone, heat alone, alternating between ice & heat.  Experiment to what works for you.  Swelling and bruising can take several weeks to resolve.   It is helpful to take an over-the-counter pain medication regularly for the first few weeks.  Choose one of the following that works best for you: Naproxen (Aleve, etc)  Two 220mg tabs twice a day Ibuprofen (Advil, etc) Three 200mg tabs four times a day (every meal & bedtime) A  prescription for pain medication (such as percocet, vicodin, oxycodone, hydrocodone, etc) should be given to you upon discharge.  Take your pain medication as prescribed.  If you are having problems/concerns with the prescription medicine (does not control pain, nausea, vomiting, rash, itching, etc), please call us [phone] to see if we need to switch you to a different pain medicine that will work better for you and/or control your side effect better. If you need a refill on your pain medication, please contact your pharmacy.  They will contact our office to request authorization. Prescriptions will not be  filled after 5 pm or on week-ends.   Avoid getting constipated.  Between the surgery and the pain medications, it is common to experience some constipation.  Increasing fluid intake and taking a fiber supplement (such as Metamucil, Citrucel, FiberCon, MiraLax, etc) 1-2 times a day regularly will usually help prevent this problem from occurring.  A mild laxative (prune juice, Milk of Magnesia, MiraLax, etc) should be taken according to package directions if there are no bowel movements after 48 hours.   Watch out for diarrhea.  If you have many loose bowel movements, simplify your diet to bland foods & liquids for a few days.  Stop any stool softeners and decrease your fiber supplement.  Switching to mild anti-diarrheal medications (Kayopectate, Pepto Bismol) can help.  If this worsens or does not improve, please call us. Wash / shower every day.  You may shower over the dressings as they are waterproof.  Continue to shower over incision(s) after the dressing is off. Remove your waterproof bandages 5 days after surgery.  You may leave the incision open to air.  You may replace a dressing/Band-Aid to cover the incision for comfort if you wish.  ACTIVITIES as tolerated:   You may resume regular (light) daily activities beginning the next day--such as daily self-care, walking, climbing stairs--gradually increasing activities as tolerated.  If you can walk 30 minutes without difficulty, it is safe to try more intense activity such as jogging, treadmill, bicycling, low-impact aerobics, swimming, etc. Save the most intensive and strenuous activity for last such as sit-ups, heavy   lifting, contact sports, etc  Refrain from any heavy lifting or straining until you are off narcotics for pain control.   DO NOT PUSH THROUGH PAIN.  Let pain be your guide: If it hurts to do something, don't do it.  Pain is your body warning you to avoid that activity for another week until the pain goes down. You may drive when you are  no longer taking prescription pain medication, you can comfortably wear a seatbelt, and you can safely maneuver your car and apply brakes. You may have sexual intercourse when it is comfortable.  FOLLOW UP in our office Please call [facility] at [phone] to set up an appointment to see your surgeon in the office for a follow-up appointment approximately 2-3 weeks after your surgery. Make sure that you call for this appointment the day you arrive home to insure a convenient appointment time. 10. IF YOU HAVE DISABILITY OR FAMILY LEAVE FORMS, BRING THEM TO THE OFFICE FOR PROCESSING.  DO NOT GIVE THEM TO YOUR DOCTOR.   WHEN TO CALL US [phone]: Poor pain control Reactions / problems with new medications (rash/itching, nausea, etc)  Fever over 101.5 F (38.5 C) Inability to urinate Nausea and/or vomiting Worsening swelling or bruising Continued bleeding from incision. Increased pain, redness, or drainage from the incision   The clinic staff is available to answer your questions during regular business hours (8:30am-5pm).  Please don't hesitate to call and ask to speak to one of our nurses for clinical concerns.   If you have a medical emergency, go to the nearest emergency room or call 911.  A surgeon from [facility] is always on call at the hospitals   [facility] [address], [address], [place], [place]  [address] ? MAIN: [phone] ? TOLL FREE: [phone] ?  FAX [phone] [email or web address]   

## 2021-02-20 NOTE — Transfer of Care (Signed)
Immediate Anesthesia Transfer of Care Note  Patient: Jacqueline Banks  Procedure(s) Performed: APPENDECTOMY LAPAROSCOPIC (Abdomen)  Patient Location: PACU  Anesthesia Type:General  Level of Consciousness: awake, alert , oriented and patient cooperative  Airway & Oxygen Therapy: Patient Spontanous Breathing and Patient connected to face mask oxygen  Post-op Assessment: Report given to RN, Post -op Vital signs reviewed and stable and Patient moving all extremities  Post vital signs: Reviewed and stable  Last Vitals:  Vitals Value Taken Time  BP 131/67 02/20/21 1345  Temp    Pulse 65 02/20/21 1345  Resp 20 02/20/21 1345  SpO2 100 % 02/20/21 1345  Vitals shown include unvalidated device data.  Last Pain:  Vitals:   02/20/21 1140  TempSrc:   PainSc: 0-No pain      Patients Stated Pain Goal: 3 (47/20/72 1828)  Complications: No notable events documented.

## 2021-02-20 NOTE — Anesthesia Procedure Notes (Signed)
Procedure Name: Intubation Date/Time: 02/20/2021 12:48 PM Performed by: Victoriano Lain, CRNA Pre-anesthesia Checklist: Patient identified, Emergency Drugs available, Suction available and Patient being monitored Patient Re-evaluated:Patient Re-evaluated prior to induction Oxygen Delivery Method: Circle system utilized Preoxygenation: Pre-oxygenation with 100% oxygen Induction Type: IV induction Ventilation: Mask ventilation without difficulty Laryngoscope Size: Mac and 3 Grade View: Grade I Tube type: Oral Number of attempts: 1 Airway Equipment and Method: Stylet Placement Confirmation: positive ETCO2, breath sounds checked- equal and bilateral, CO2 detector and ETT inserted through vocal cords under direct vision Secured at: 22 cm Tube secured with: Tape Dental Injury: Teeth and Oropharynx as per pre-operative assessment  Comments: DL x1 [place]

## 2021-02-20 NOTE — H&P (Signed)
The patient is a 55 year old female who presents with a colonic polyp. 55 year old healthy female, status post recent colonoscopy which showed an appendiceal orifice polyp.  Biopsy shows tubular adenoma.  It is unclear if the entire polyp inside the appendix is removed.  She presented to the office to discuss laparoscopic appendectomy.     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 Clobetasol Propionate  (0.05% Ointment, External) Active. Doxycycline Hyclate  (100MG  Tablet, Oral) Active. Estradiol  (0.1MG /GM Cream, Vaginal) Active. Eucrisa  (2% Ointment, External) Active. Fluticasone Propionate  (0.05% Cream, External) Active. Moxifloxacin HCl  (0.5% Solution, Ophthalmic) Active. Sertraline HCl  (50MG  Tablet, Oral) Active. Trintellix  (10MG  Tablet, Oral) Active. Medications Reconciled    Social History Janeann Forehand, CNA; 12/17/2020 11:55 AM) Alcohol use  Moderate alcohol use. Caffeine use  Coffee. No drug use  Tobacco use  Former smoker.   Family History Janeann Forehand, CNA; 12/17/2020 11:55 AM) Colon Polyps  Father. Melanoma  Mother. Prostate Cancer  Brother, Father.   Pregnancy / Birth History Janeann Forehand, CNA; 12/17/2020 11:55 AM) Age at menarche  29 years. Age of menopause  63-55 Gravida  5 Irregular periods  Length (months) of breastfeeding  3-6 Maternal age  >63 Para  2   Other Problems Janeann Forehand, CNA; 12/17/2020 11:55 AM) No pertinent past medical history          Review of Systems  General Not Present-  Appetite Loss, Chills, Fatigue, Fever, Night Sweats, Weight Gain and Weight Loss. Skin Present- Dryness, Non-Healing Wounds and Rash. Not Present- Change in Wart/Mole, Hives, Jaundice, New Lesions and Ulcer. HEENT Not Present- Earache, Hearing Loss, Hoarseness, Nose Bleed, Oral Ulcers, Ringing in the Ears, Seasonal Allergies, Sinus Pain, Sore Throat, Visual Disturbances, Wears glasses/contact lenses and Yellow Eyes. Respiratory Not Present- Bloody sputum, Chronic Cough, Difficulty Breathing, Snoring and Wheezing. Breast Not Present- Breast Mass, Breast Pain, Nipple Discharge and Skin Changes. Cardiovascular Not Present- Chest Pain, Difficulty Breathing Lying Down, Leg Cramps, Palpitations, Rapid Heart Rate, Shortness of Breath and Swelling of Extremities. Gastrointestinal Present- Hemorrhoids. Not Present- Abdominal Pain, Bloating, Bloody Stool, Change in Bowel Habits, Chronic diarrhea, Constipation, Difficulty Swallowing, Excessive gas, Gets full quickly at meals, Indigestion, Nausea, Rectal Pain and Vomiting. Female Genitourinary Not Present- Frequency, Nocturia, Painful Urination, Pelvic Pain and Urgency. Musculoskeletal Not Present- Back Pain, Joint Pain, Joint Stiffness, Muscle Pain, Muscle Weakness and Swelling of Extremities. Neurological Not Present- Decreased Memory, Fainting, Headaches, Numbness, Seizures, Tingling, Tremor, Trouble walking and Weakness. Psychiatric Not Present- Anxiety, Bipolar, Change in Sleep Pattern, Depression, Fearful and Frequent crying. Endocrine Not Present- Cold Intolerance, Excessive Hunger, Hair Changes, Heat Intolerance, Hot flashes and New Diabetes. Hematology Not Present- Blood Thinners, Easy Bruising, Excessive bleeding, Gland problems, HIV and Persistent Infections.   BP 117/79   Pulse 80   Temp 99.1 F (37.3 C) (Oral)   Resp 17   Ht 5\' 9"  (1.753 m)   Wt 68 kg   SpO2 96%   BMI 22.15 kg/m         Physical Exam  General Mental Status -  Alert. General Appearance - Cooperative.  CV: RRR Lungs: CTA Abdomen Palpation/Percussion Palpation and Percussion of the abdomen reveal - Soft and Non Tender.       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 Biopsy shows his tubular adenoma. I recommended laparoscopic appendectomy to remove the entire appendix and make sure that the polyp is totally cleared. I discussed this with her in detail. We have discussed the risks of appendectomy including bleeding, damage to adjacent structures, staple line leak, infection and hernia. We discussed that all of these risks are very minimal. We discussed the risk of not doing surgery as well. All questions were answered. Patient agrees to proceed with surgery.

## 2021-02-20 NOTE — Anesthesia Postprocedure Evaluation (Signed)
Anesthesia Post Note  Patient: Jacqueline Banks  Procedure(s) Performed: APPENDECTOMY LAPAROSCOPIC (Abdomen)     Patient location during evaluation: PACU Anesthesia Type: General Level of consciousness: awake and alert Pain management: pain level controlled Vital Signs Assessment: post-procedure vital signs reviewed and stable Respiratory status: spontaneous breathing, nonlabored ventilation, respiratory function stable and patient connected to nasal cannula oxygen Cardiovascular status: blood pressure returned to baseline and stable Postop Assessment: no apparent nausea or vomiting Anesthetic complications: no   No notable events documented.  Last Vitals:  Vitals:   02/20/21 1430 02/20/21 1445  BP: 126/76 118/68  Pulse: 62 65  Resp: 15 16  Temp:  36.8 C  SpO2: 98% 96%    Last Pain:  Vitals:   02/20/21 1445  TempSrc:   PainSc: 0-No pain                 Jesus Nevills S

## 2021-02-20 NOTE — Op Note (Signed)
Jacqueline Banks [id number]   PRE-OPERATIVE DIAGNOSIS:  APPENDICEAL POLYP  POST-OPERATIVE DIAGNOSIS:  APPENDICEAL POLYP   Procedure(s): APPENDECTOMY LAPAROSCOPIC   Surgeon(s): Leighton Ruff, MD  ASSISTANT: none   ANESTHESIA:   local and general  EBL:   5 ml  Delay start of Pharmacological VTE agent (>24hrs) due to surgical blood loss or risk of bleeding:  no  DRAINS: none   SPECIMEN:  Source of Specimen:  appendix with cuff of cecum  DISPOSITION OF SPECIMEN:  PATHOLOGY  COUNTS:  YES  PLAN OF CARE: Discharge to home after PACU  PATIENT DISPOSITION:  PACU - hemodynamically stable.   INDICATIONS: Patient with concerning symptoms & work up suspicious for appendicitis.  Surgery was recommended:  The anatomy & physiology of the digestive tract was discussed.  The pathophysiology of appendicitis was discussed.  Natural history risks without surgery was discussed.   I feel the risks of no intervention will lead to serious problems that outweigh the operative risks; therefore, I recommended diagnostic laparoscopy with removal of appendix to remove the pathology.  Laparoscopic & open techniques were discussed.   I noted a good likelihood this will help address the problem.    Risks such as bleeding, infection, abscess, leak, reoperation, possible ostomy, hernia, heart attack, death, and other risks were discussed.  Goals of post-operative recovery were discussed as well.  We will work to minimize complications.  Questions were answered.  The patient expresses understanding & wishes to proceed with surgery.  OR FINDINGS: normal appearing appendix  DESCRIPTION:   The patient was identified & brought into the operating room. The patient was positioned supine with left arm tucked. SCDs were active during the entire case. The patient underwent general anesthesia without any difficulty.  A foley catheter was inserted under sterile conditions. The abdomen was prepped and draped in a  sterile fashion. A Surgical Timeout confirmed our plan.   I made a transverse incision through the superior umbilical fold.  I made a nick in the infraumbilical fascia and confirmed peritoneal entry.  I placed a stay suture and then the [facility] port.  We induced carbon dioxide insufflation.  Camera inspection revealed no injury.  I placed additional ports under direct laparoscopic visualization.  I mobilized the terminal ileum to proximal ascending colon in a lateral to medial fashion.  I took care to avoid injuring any retroperitoneal structures. I freed the appendix off its attachments to the ascending colon and cecal mesentery.  I elevated the appendix.  I was able to free off the base of the appendix, which was still viable.  I stapled the appendix off the cecum using a laparoscopic bowel load stapler.  I took a healthy cuff viable cecum. I skeletonized & ligated the mesoappendix with a vascular load stapler.   I placed the appendix inside an EndoCatch bag and removed out the [place] port.  I did copious irrigation. Hemostasis was good in the mesoappendix, colon mesentery, and retroperitoneum. Staple line was intact on the cecum with no bleeding. I washed out the pelvis, retrohepatic space and right paracolic gutter.  Hemostasis is good. There was no perforation or injury.   I aspirated the carbon dioxide. I removed the ports. I closed the umbilical fascia site using a 0 Vicryl stitch.  I reapproximated the subcutaneous tissue with a 2-0 Vicryl suture.  I closed skin using 4-0 vicryl stitch.  Sterile dressings were applied.  Patient was extubated and sent to the recovery room.  I discussed the operative findings  with the patient's family.  Questions answered. They expressed understanding and appreciation.

## 2021-02-21 ENCOUNTER — Encounter (HOSPITAL_COMMUNITY): Payer: Self-pay | Admitting: General Surgery

## 2021-02-21 LAB — SURGICAL PATHOLOGY

## 2021-02-25 ENCOUNTER — Telehealth: Payer: Self-pay

## 2021-02-25 NOTE — Telephone Encounter (Signed)
Recall adjusted.

## 2021-02-25 NOTE — Telephone Encounter (Signed)
-----   Message from Mauri Pole, MD sent at 02/24/2021 11:13 AM EDT ----- Regarding: RE: path reprot Thank you Elmo Putt, I will have her come back for recall colonoscopy in 1 year to check if there is any residual polyp and check the suture line.  Beth, can you please have the recall in place. Thanks Margarette Asal ----- Message ----- From: Leighton Ruff, MD Sent: 8/59/2924  10:12 AM EDT To: Mauri Pole, MD Subject: path reprot                                    I did her appendectomy last week for the appendiceal polyp.  Best I can tell from the path report, there is a question on whether the staple line is clear of polyp.  I resected as much cecum as I could without compromising her IC valve.  Do you think this warrants a repeat scope in a year or so to eval that margin?  Thanks,  Elmo Putt  SURGICAL PATHOLOGY  CASE: [id number]  PATIENT: Jacqueline Banks  Surgical Pathology Report    Clinical History: Appendiceal polyp (crm)    FINAL MICROSCOPIC DIAGNOSIS:   A. APPENDIX, APPENDECTOMY:  - Appendix with sessile serrated polyp  - Cecal cuff with residual adenomatous change  - No high-grade dysplasia or malignancy identified    GROSS DESCRIPTION:   Received fresh is a 6.6 cm in length appendix with attached possible  cecal cuff, 1.8 x 1.4 x 1.3 cm, staple line at the margin. On opening,  there is a 1 x 0.7 x 0.2 cm area of hyperemic ragged mucosa involved  with the staple line. Remaining portion of presumed cecal cuff has pink  to hyperemic smooth soft mucosa.

## 2021-03-14 HISTORY — PX: APPENDECTOMY: SHX54

## 2021-03-26 IMAGING — CR DG CHEST 2V
2 series · 2 of 2 positions shown · non-contrast
Comparison: Right rib series same day.  Chest x-ray 06/21/2009.

CLINICAL DATA: Prior injury.  Right-sided rib pain.

EXAM:
CHEST - 2 VIEW

[w chest pa]
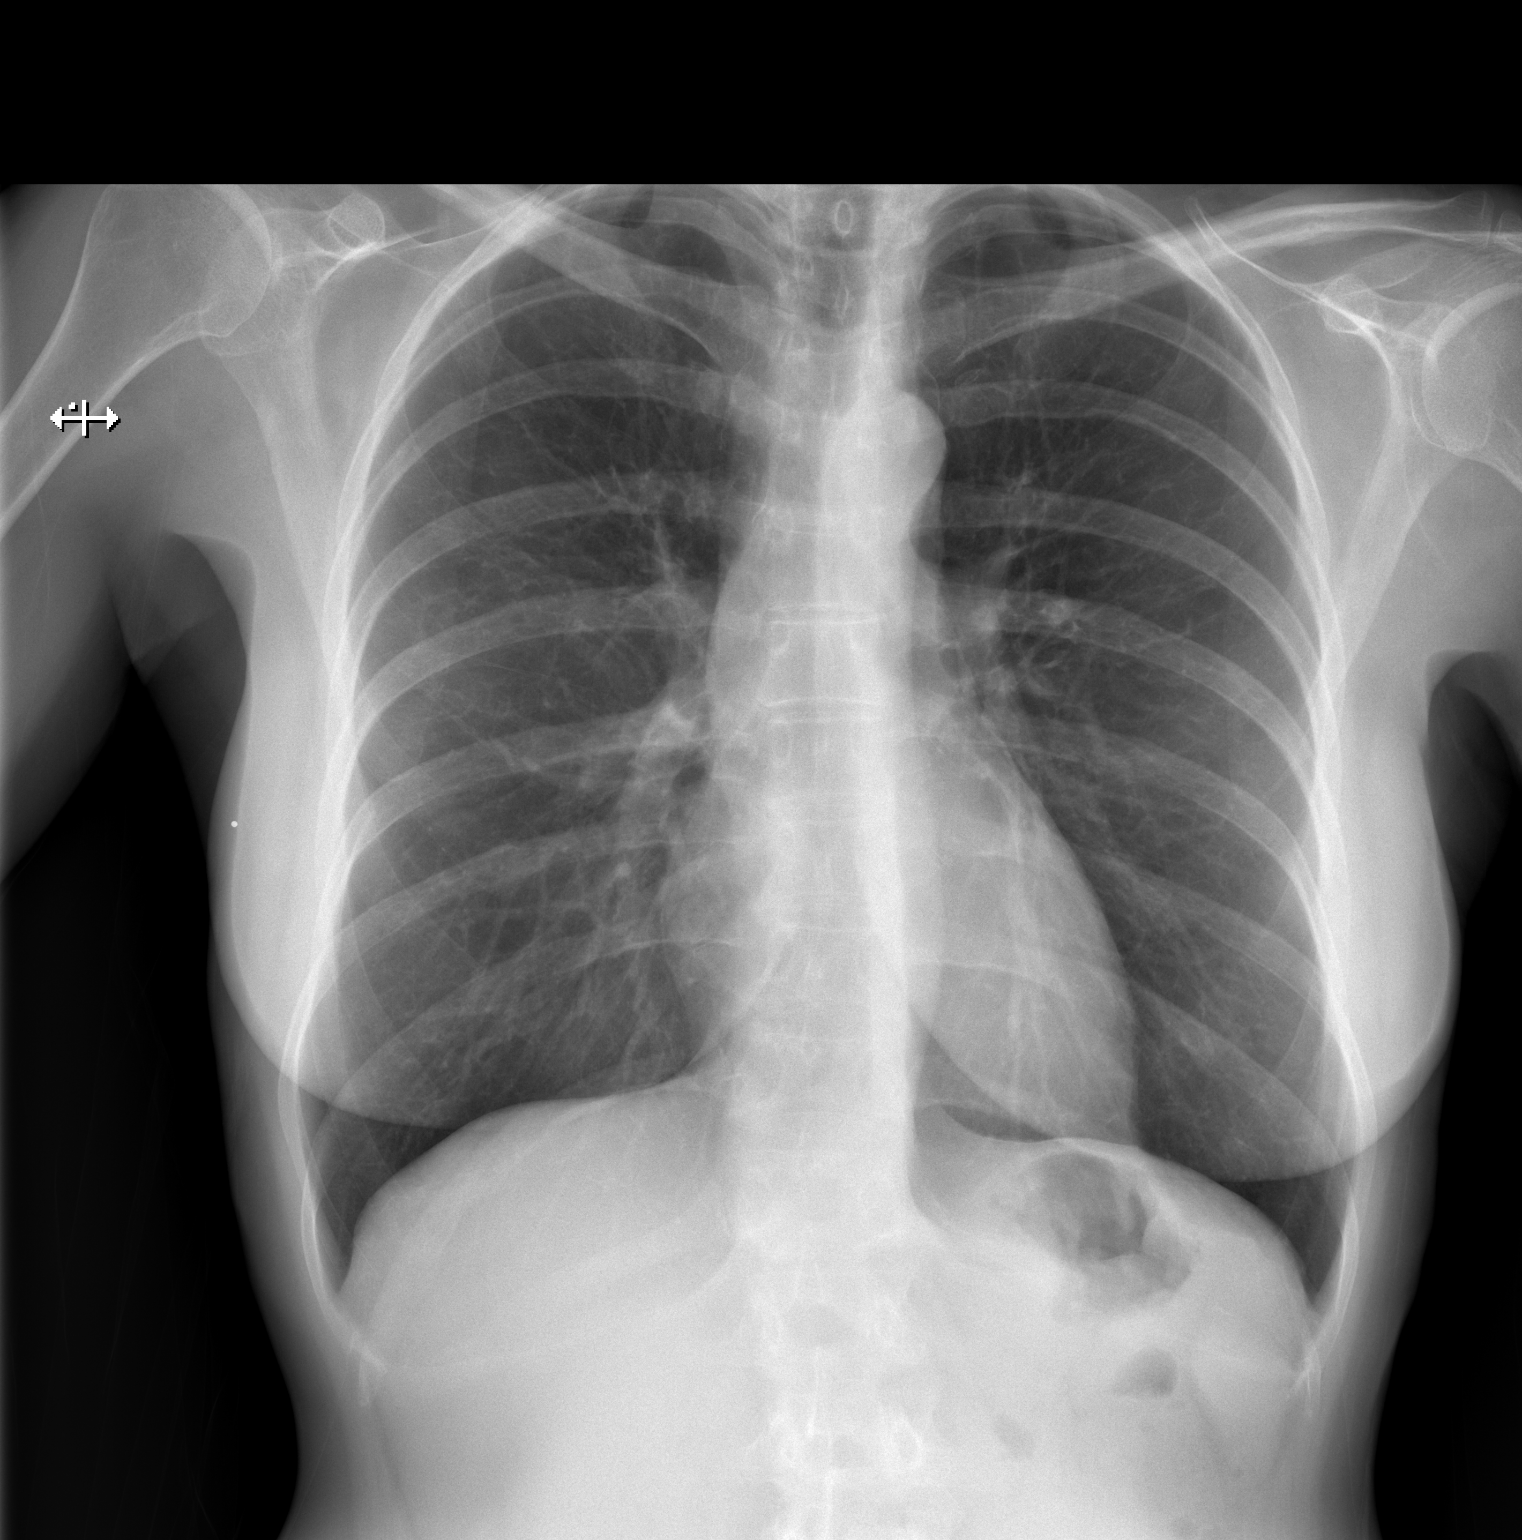

[w chest lat]
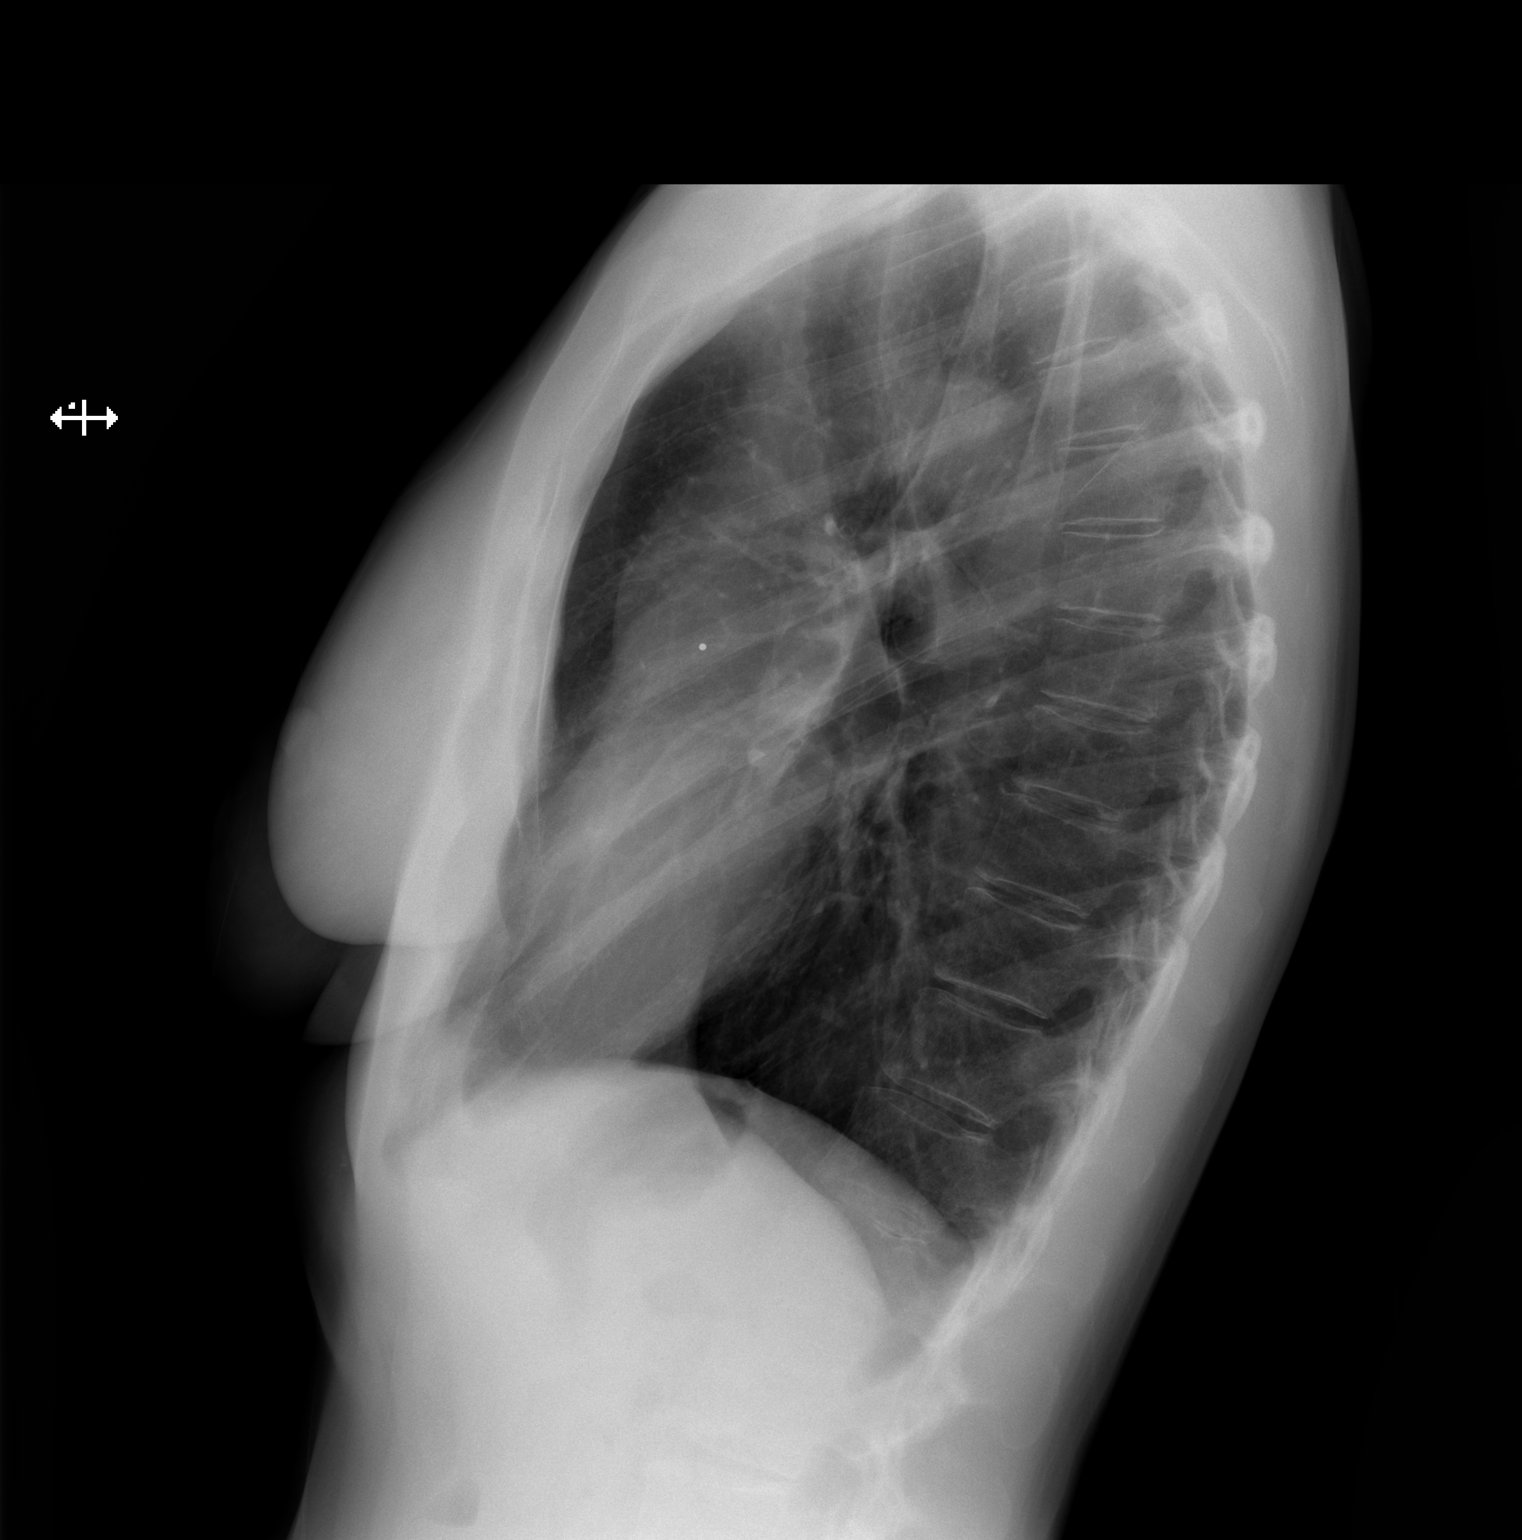

[2 of 2 positions shown; findings below may reference images not displayed]

FINDINGS: Mediastinum hilar structures normal. COPD. No focal infiltrate. No
pleural effusion or pneumothorax. Mild thoracolumbar spine scoliosis
concave left. No evidence of displaced rib fracture.
IMPRESSION: 1.  No acute cardiopulmonary disease.

2.  No evidence of displaced rib fracture or pneumothorax.

## 2021-03-26 IMAGING — CR DG RIBS 2V*R*
2 series · 2 of 2 positions shown · non-contrast
Comparison: Chest x-ray same day.  Chest x-ray 06/22/2019.

CLINICAL DATA: Right-sided injury.  Right rib pain.

EXAM:
RIGHT RIBS - 2 VIEW

[w ribs ap upper right]
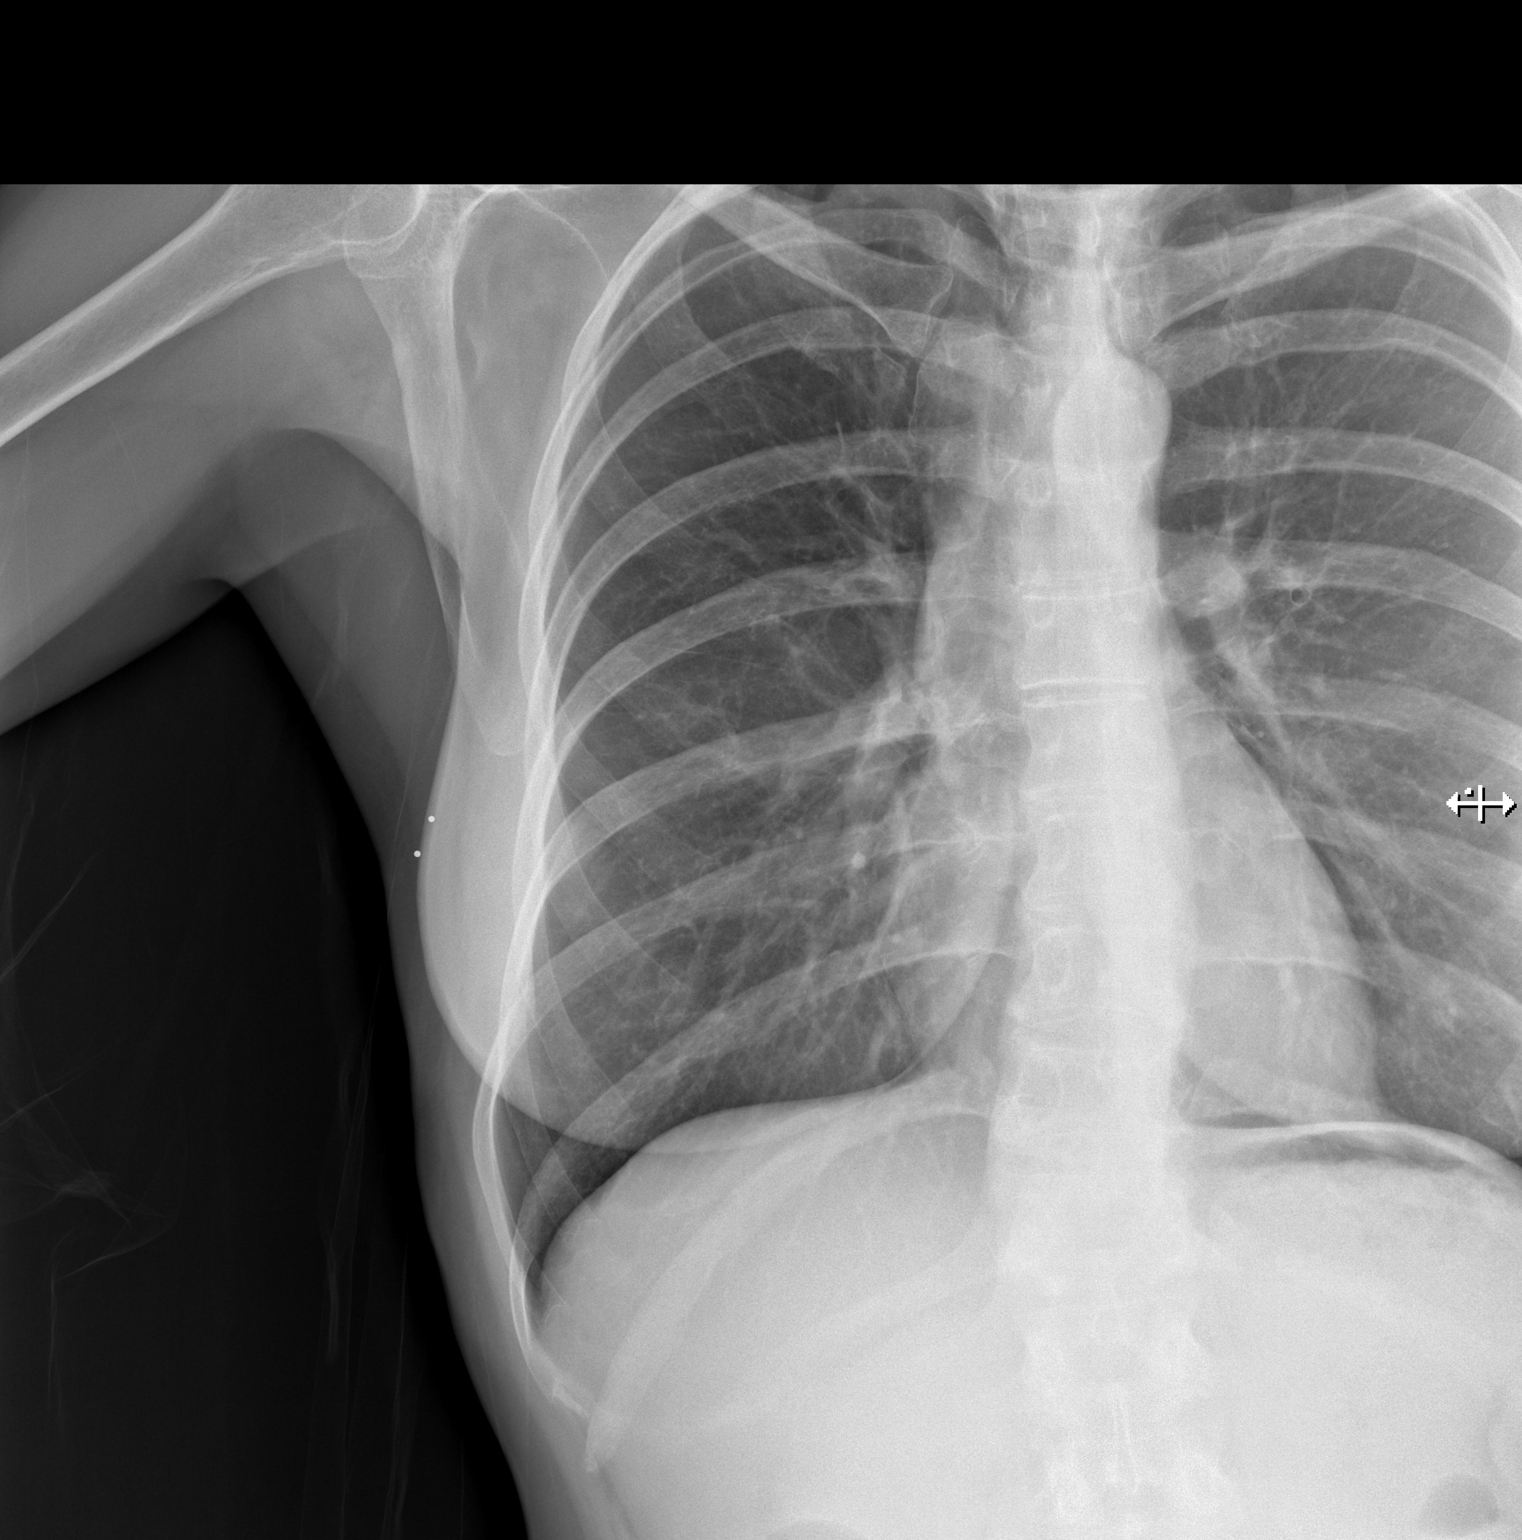

[w ribs obl right]
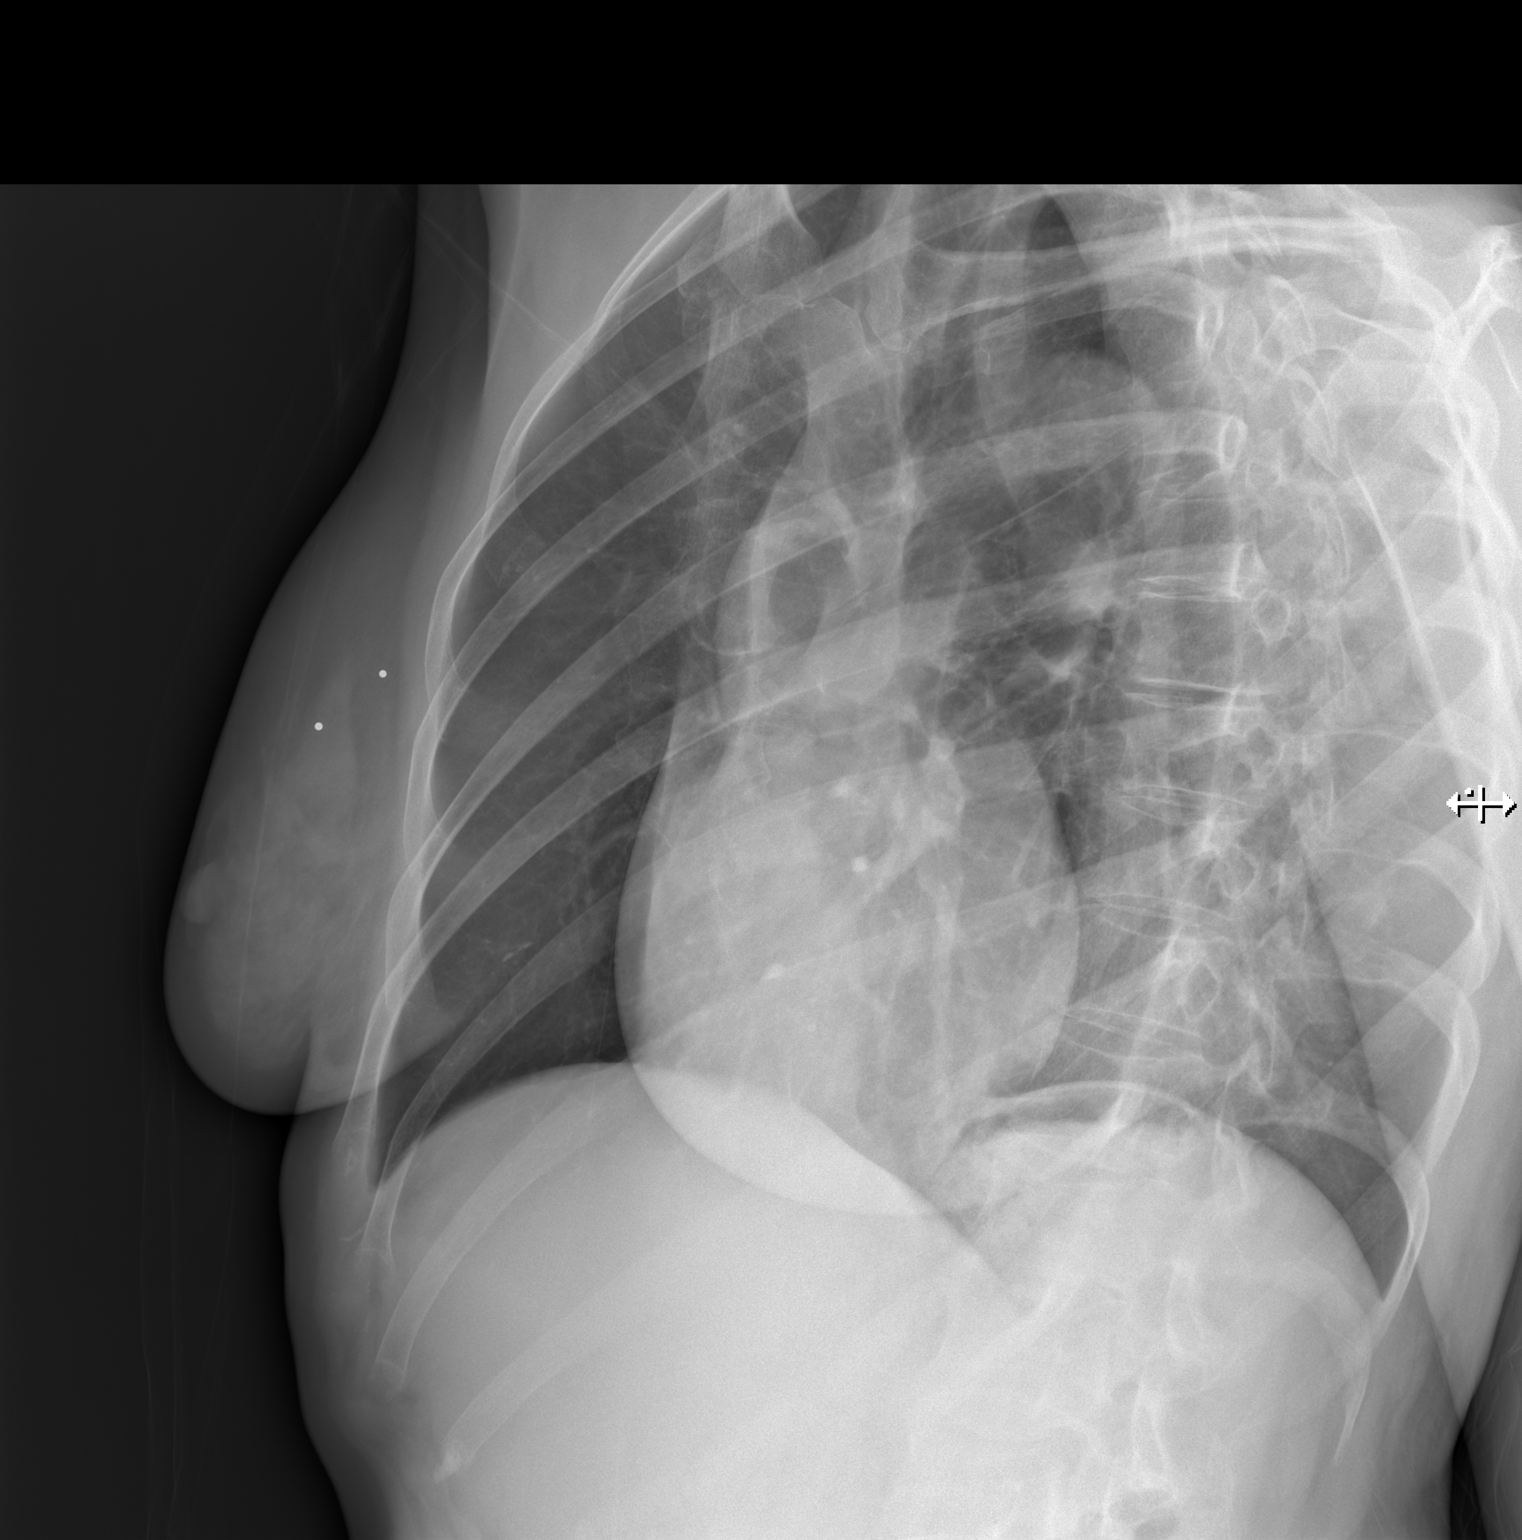

[2 of 2 positions shown; findings below may reference images not displayed]

FINDINGS: No evidence of displaced rib fracture. No acute abnormality.
Thoracolumbar spine scoliosis noted. No evidence of pneumothorax.
Right apical pleural thickening consistent scarring again noted. No
interim change from 06/21/2009.
IMPRESSION: No evidence of displaced rib fracture or acute abnormality. No
evidence of pneumothorax.

## 2021-06-05 ENCOUNTER — Telehealth (HOSPITAL_COMMUNITY): Payer: BC Managed Care – PPO | Admitting: Psychiatry

## 2021-06-05 ENCOUNTER — Other Ambulatory Visit: Payer: Self-pay

## 2021-06-05 ENCOUNTER — Telehealth (HOSPITAL_COMMUNITY): Payer: Self-pay | Admitting: Psychiatry

## 2021-06-05 NOTE — Telephone Encounter (Signed)
I called the patient at our scheduled appointment time. There was no answer. I left a voice message for patient to call the clinic back at their convinence.   

## 2021-06-12 ENCOUNTER — Other Ambulatory Visit: Payer: Self-pay

## 2021-06-12 ENCOUNTER — Encounter (HOSPITAL_COMMUNITY): Payer: Self-pay | Admitting: Psychiatry

## 2021-06-12 ENCOUNTER — Telehealth (HOSPITAL_BASED_OUTPATIENT_CLINIC_OR_DEPARTMENT_OTHER): Payer: BC Managed Care – PPO | Admitting: Psychiatry

## 2021-06-12 DIAGNOSIS — F1010 Alcohol abuse, uncomplicated: Secondary | ICD-10-CM | POA: Diagnosis not present

## 2021-06-12 DIAGNOSIS — F331 Major depressive disorder, recurrent, moderate: Secondary | ICD-10-CM

## 2021-06-12 MED ORDER — BUPROPION HCL ER (XL) 150 MG PO TB24: 150.0000 mg | ORAL_TABLET | ORAL | 0 refills | Status: DC

## 2021-06-12 MED ORDER — VORTIOXETINE HBR 20 MG PO TABS: 20.0000 mg | ORAL_TABLET | Freq: Every day | ORAL | 0 refills | Status: DC

## 2021-06-12 NOTE — Progress Notes (Signed)
Virtual Visit via Telephone Note  I connected with Jacqueline Banks on 06/12/21 at  3:00 PM EDT by telephone and verified that I am speaking with the correct person using two identifiers.  Location: Patient: home Provider: office   I discussed the limitations, risks, security and privacy concerns of performing an evaluation and management service by telephone and the availability of in person appointments. I also discussed with the patient that there may be a patient responsible charge related to this service. The patient expressed understanding and agreed to proceed.   History of Present Illness: Jacqueline Banks shares she is doing well. She has no concerns at this time. She denies depression and anxiety. Jacqueline Banks is still a little bothered that she is on so many meds and doesn't want to stop or change them. She denies anhedonia, isolation and hopelessness. Her sleep and appetite are good. Jacqueline Banks denies SI/HI. She has gained some weight and thinks it is due to menopause. Her health is fine. Jacqueline Banks continues to drink about 3/4 bottle of wine about 5 nights/week. She has tried other drinks but nothing is as satisfying. Jacqueline Banks often gets upset with her about drinking wine at night. It makes her a little "buzzed" and then she goes to bed. Jacqueline Banks does think she might be craving it. She does not have withdrawal symptoms after drinking or if skipping on a night.    Observations/Objective:  General Appearance: unable to assess  Eye Contact:  unable to assess  Speech:  Clear and Coherent and Normal Rate  Volume:  Normal  Mood:  Euthymic  Affect:  Full Range  Thought Process:  Goal Directed, Linear, and Descriptions of Associations: Intact  Orientation:  Full (Time, Place, and Person)  Thought Content:  Logical  Suicidal Thoughts:  No  Homicidal Thoughts:  No  Memory:  Immediate;   Good  Judgement:  Good  Insight:  Good  Psychomotor Activity: unable to assess  Concentration:  Concentration: Good  Recall:   Good  Fund of Knowledge:  Good  Language:  Good  Akathisia:  unable to assess  Handed:  unable to assess  AIMS (if indicated):     Assets:  Communication Skills Desire for Improvement Financial Resources/Insurance Housing Leisure Time Resilience Social Support Talents/Skills Transportation Vocational/Educational  ADL's:  unable to assess  Cognition:  WNL  Sleep:        Assessment and Plan: Depression screen [facility] 2/9 06/12/2021 12/12/2020 10/21/2020 09/19/2019 11/08/2018  Decreased Interest 0 0 0 0 3  Down, Depressed, Hopeless 0 0 0 0 2  PHQ - 2 Score 0 0 0 0 5  Altered sleeping - - 0 - 2  Tired, decreased energy - - 0 - 3  Change in appetite - - 0 - 2  Feeling bad or failure about yourself  - - 0 - 1  Trouble concentrating - - 0 - 1  Moving slowly or fidgety/restless - - 0 - -  Suicidal thoughts - - 0 - 0  PHQ-9 Score - - 0 - 14  Difficult doing work/chores - - Not difficult at all - -    Flowsheet Row Video Visit from 06/12/2021 in [facility] Video Visit from 12/12/2020 in [facility] No Risk No Risk      - we discussed her alcohol consumption and potential long term consequences. She is going to look into other options to relax at night. She hasn't been motivated to make a  change as of yet. Referred for substance abuse therapy   1. MDD (major depressive disorder), recurrent episode, moderate (HCC) - buPROPion (WELLBUTRIN XL) 150 MG 24 hr tablet; Take 1 tablet (150 mg total) by mouth every morning.  Dispense: 90 tablet; Refill: 0 - vortioxetine HBr (TRINTELLIX) 20 MG TABS tablet; Take 1 tablet (20 mg total) by mouth daily.  Dispense: 90 tablet; Refill: 0  2. Alcohol use disorder, mild, abuse   Follow Up Instructions: In 2-3 months or sooner if needed   I discussed the assessment and treatment plan with the patient. The patient was provided an opportunity to ask  questions and all were answered. The patient agreed with the plan and demonstrated an understanding of the instructions.   The patient was advised to call back or seek an in-person evaluation if the symptoms worsen or if the condition fails to improve as anticipated.  I provided 19 minutes of non-face-to-face time during this encounter.   Charlcie Cradle, MD

## 2021-06-17 ENCOUNTER — Encounter (HOSPITAL_COMMUNITY): Payer: Self-pay

## 2021-06-17 ENCOUNTER — Ambulatory Visit (HOSPITAL_COMMUNITY): Payer: BC Managed Care – PPO | Admitting: Licensed Clinical Social Worker

## 2021-06-17 ENCOUNTER — Other Ambulatory Visit: Payer: Self-pay

## 2021-06-17 DIAGNOSIS — F331 Major depressive disorder, recurrent, moderate: Secondary | ICD-10-CM

## 2021-06-17 DIAGNOSIS — F1010 Alcohol abuse, uncomplicated: Secondary | ICD-10-CM

## 2021-06-17 NOTE — Plan of Care (Signed)
See tx plan and cca for details

## 2021-07-01 ENCOUNTER — Ambulatory Visit (HOSPITAL_COMMUNITY): Payer: BC Managed Care – PPO | Admitting: Licensed Clinical Social Worker

## 2021-07-08 ENCOUNTER — Ambulatory Visit (HOSPITAL_COMMUNITY): Payer: BC Managed Care – PPO | Admitting: Licensed Clinical Social Worker

## 2021-08-28 ENCOUNTER — Telehealth (HOSPITAL_BASED_OUTPATIENT_CLINIC_OR_DEPARTMENT_OTHER): Payer: BC Managed Care – PPO | Admitting: Psychiatry

## 2021-08-28 ENCOUNTER — Other Ambulatory Visit: Payer: Self-pay

## 2021-08-28 DIAGNOSIS — F1010 Alcohol abuse, uncomplicated: Secondary | ICD-10-CM

## 2021-08-28 DIAGNOSIS — F331 Major depressive disorder, recurrent, moderate: Secondary | ICD-10-CM

## 2021-08-28 MED ORDER — BUPROPION HCL ER (XL) 150 MG PO TB24: 150.0000 mg | ORAL_TABLET | ORAL | 0 refills | Status: DC

## 2021-08-28 MED ORDER — VORTIOXETINE HBR 20 MG PO TABS: 20.0000 mg | ORAL_TABLET | Freq: Every day | ORAL | 0 refills | Status: DC

## 2021-08-28 MED ORDER — DISULFIRAM 250 MG PO TABS: 250.0000 mg | ORAL_TABLET | Freq: Every day | ORAL | 1 refills | Status: DC

## 2021-08-28 NOTE — Progress Notes (Signed)
Virtual Visit via Video Note  I connected with Jacqueline Banks on 08/28/21 at 10:00 AM EST by a video enabled telemedicine application and verified that I am speaking with the correct person using two identifiers.  Location: Patient: home Provider: office   I discussed the limitations of evaluation and management by telemedicine and the availability of in person appointments. The patient expressed understanding and agreed to proceed.  History of Present Illness: "I feel good but I still drink wine". She went for a visit with the substance abuse counselor and didn't think it was helpful. She still drinks 3 glasses of wine at night and is able to fall asleep quickly. She is buzzed but not drunk.  Shaelin is high functioning and productive. She gets mad at herself because of it and it makes her depressed. She denies isolation, hopelessness and anhedonia. She denies SI/HI.    Observations/Objective: Psychiatric Specialty Exam: ROS  There were no vitals taken for this visit.There is no height or weight on file to calculate BMI.  General Appearance: Casual and Neat  Eye Contact:  Good  Speech:  Clear and Coherent and Normal Rate  Volume:  Normal  Mood:  Depressed  Affect:  Full Range  Thought Process:  Goal Directed, Linear, and Descriptions of Associations: Intact  Orientation:  Full (Time, Place, and Person)  Thought Content:  Logical  Suicidal Thoughts:  No  Homicidal Thoughts:  No  Memory:  Immediate;   Good  Judgement:  Good  Insight:  Good  Psychomotor Activity:  Normal  Concentration:  Concentration: Good  Recall:  Good  Fund of Knowledge:  Good  Language:  Good  Akathisia:  No  Handed:  Right  AIMS (if indicated):     Assets:  Communication Skills Desire for Improvement Financial Resources/Insurance Housing Intimacy Leisure Time Resilience Social Support Talents/Skills Transportation Vocational/Educational  ADL's:  Intact  Cognition:  WNL  Sleep:         Assessment and Plan: Pt does not crave alcohol and does not think Naltrexone is going to help.  Start Antabuse for alcohol abuse  1. Alcohol use disorder, mild, abuse - disulfiram (ANTABUSE) 250 MG tablet; Take 1 tablet (250 mg total) by mouth daily.  Dispense: 30 tablet; Refill: 1  2. MDD (major depressive disorder), recurrent episode, moderate (HCC) - buPROPion (WELLBUTRIN XL) 150 MG 24 hr tablet; Take 1 tablet (150 mg total) by mouth every morning.  Dispense: 90 tablet; Refill: 0 - vortioxetine HBr (TRINTELLIX) 20 MG TABS tablet; Take 1 tablet (20 mg total) by mouth daily.  Dispense: 90 tablet; Refill: 0   Follow Up Instructions: In 2-3 weeks or sooner if needed   I discussed the assessment and treatment plan with the patient. The patient was provided an opportunity to ask questions and all were answered. The patient agreed with the plan and demonstrated an understanding of the instructions.   The patient was advised to call back or seek an in-person evaluation if the symptoms worsen or if the condition fails to improve as anticipated.  I provided 17 minutes of non-face-to-face time during this encounter.   Charlcie Cradle, MD

## 2021-09-18 ENCOUNTER — Telehealth (HOSPITAL_COMMUNITY): Payer: BC Managed Care – PPO | Admitting: Psychiatry

## 2021-09-19 DIAGNOSIS — Z6824 Body mass index (BMI) 24.0-24.9, adult: Secondary | ICD-10-CM | POA: Diagnosis not present

## 2021-09-19 DIAGNOSIS — Z113 Encounter for screening for infections with a predominantly sexual mode of transmission: Secondary | ICD-10-CM | POA: Diagnosis not present

## 2021-09-19 DIAGNOSIS — Z1231 Encounter for screening mammogram for malignant neoplasm of breast: Secondary | ICD-10-CM | POA: Diagnosis not present

## 2021-09-19 DIAGNOSIS — Z01419 Encounter for gynecological examination (general) (routine) without abnormal findings: Secondary | ICD-10-CM | POA: Diagnosis not present

## 2021-09-19 DIAGNOSIS — Z124 Encounter for screening for malignant neoplasm of cervix: Secondary | ICD-10-CM | POA: Diagnosis not present

## 2021-09-19 LAB — HM MAMMOGRAPHY

## 2021-09-25 ENCOUNTER — Other Ambulatory Visit: Payer: Self-pay

## 2021-09-25 ENCOUNTER — Telehealth (HOSPITAL_BASED_OUTPATIENT_CLINIC_OR_DEPARTMENT_OTHER): Payer: BC Managed Care – PPO | Admitting: Psychiatry

## 2021-09-25 DIAGNOSIS — F1010 Alcohol abuse, uncomplicated: Secondary | ICD-10-CM

## 2021-09-25 DIAGNOSIS — F331 Major depressive disorder, recurrent, moderate: Secondary | ICD-10-CM

## 2021-09-25 NOTE — Progress Notes (Signed)
Virtual Visit via Video Note  I connected with Jacqueline Banks on 09/25/21 at  3:15 PM EST by a video enabled telemedicine application and verified that I am speaking with the correct person using two identifiers.  Location: Patient: home Provider: office   I discussed the limitations of evaluation and management by telemedicine and the availability of in person appointments. The patient expressed understanding and agreed to proceed.  History of Present Illness: Jacqueline Banks shares that she had a great holiday. Jacqueline Banks has been taking Antabuse since right after Christmas. She took 1/2 tab first and noted a hot flash for 2 days while drinking a glass of wine. She has been taking 1/4 tab every night since Jan 2 and has not had any wine since then. If she does want to drink at all it's in the late evening when she was usually drank. She now drinks water instead and it able to wait out the desire within a few minutes. Her depression is really good. She denies anhedonia, isolation and hopelessness. Her sleep, appetite and energy are good. She denies SI/HI.    Observations/Objective: Psychiatric Specialty Exam: ROS  There were no vitals taken for this visit.There is no height or weight on file to calculate BMI.  General Appearance: Casual and Fairly Groomed  Eye Contact:  Good  Speech:  Clear and Coherent and Normal Rate  Volume:  Normal  Mood:  Euthymic  Affect:  Full Range  Thought Process:  Goal Directed, Linear, and Descriptions of Associations: Intact  Orientation:  Full (Time, Place, and Person)  Thought Content:  Logical  Suicidal Thoughts:  No  Homicidal Thoughts:  No  Memory:  Immediate;   Good  Judgement:  Good  Insight:  Good  Psychomotor Activity:  Normal  Concentration:  Concentration: Good  Recall:  Good  Fund of Knowledge:  Good  Language:  Good  Akathisia:  No  Handed:  Right  AIMS (if indicated):     Assets:  Communication Skills Desire for Improvement Financial  Resources/Insurance Housing Intimacy Leisure Time Resilience Social Support Talents/Skills Transportation Vocational/Educational  ADL's:  Intact  Cognition:  WNL  Sleep:        Assessment and Plan: Depression screen [facility] 2/9 09/25/2021 06/17/2021 06/12/2021 12/12/2020 10/21/2020  Decreased Interest 0 0 0 0 0  Down, Depressed, Hopeless 0 0 0 0 0  PHQ - 2 Score 0 0 0 0 0  Altered sleeping - 0 - - 0  Tired, decreased energy - 0 - - 0  Change in appetite - 0 - - 0  Feeling bad or failure about yourself  - 0 - - 0  Trouble concentrating - 0 - - 0  Moving slowly or fidgety/restless - 0 - - 0  Suicidal thoughts - 0 - - 0  PHQ-9 Score - 0 - - 0  Difficult doing work/chores - Not difficult at all - - Not difficult at all    Flowsheet Row Video Visit from 09/25/2021 in [facility] Counselor from 06/17/2021 in [place] Video Visit from 06/12/2021 in [place] No Risk No Risk No Risk       1. MDD (major depressive disorder), recurrent episode, moderate ([place])  2. Alcohol use disorder, mild, abuse   Continue Antabuse  Continue Wellbutrin and Trintellix   Follow Up Instructions: In 2-3 months or sooner if needed   I discussed the assessment and treatment plan with the  patient. The patient was provided an opportunity to ask questions and all were answered. The patient agreed with the plan and demonstrated an understanding of the instructions.   The patient was advised to call back or seek an in-person evaluation if the symptoms worsen or if the condition fails to improve as anticipated.  I provided 12 minutes of non-face-to-face time during this encounter.   Charlcie Cradle, MD

## 2021-10-02 ENCOUNTER — Other Ambulatory Visit: Payer: Self-pay | Admitting: Obstetrics & Gynecology

## 2021-10-02 DIAGNOSIS — E2839 Other primary ovarian failure: Secondary | ICD-10-CM

## 2021-10-06 ENCOUNTER — Encounter: Payer: Self-pay | Admitting: Internal Medicine

## 2021-10-17 DIAGNOSIS — F1010 Alcohol abuse, uncomplicated: Secondary | ICD-10-CM | POA: Insufficient documentation

## 2021-10-17 DIAGNOSIS — F1020 Alcohol dependence, uncomplicated: Secondary | ICD-10-CM | POA: Insufficient documentation

## 2021-10-17 NOTE — Progress Notes (Signed)
Complete Physical  Assessment and Plan:  Jacqueline Banks was seen today for annual exam.  Diagnoses and all orders for this visit:  Encounter for Annual Physical Exam with abnormal findings Due annually  Health Maintenance reviewed Healthy lifestyle reviewed and goals set Check about shingrix  Moderate episode of recurrent major depressive disorder ([place]) Well controlled with current meds; psych is following Lifestyle discussed: diet/exerise, sleep hygiene, stress management, hydration  Hyperlipidemia, unspecified hyperlipidemia type Mild elevations; otherwise low risk hx; managing with lifestyle  Continue low cholesterol diet and exercise.  Check lipid panel.  -     Lipid panel -     TSH  Vitamin D deficiency -     VITAMIN D 25 Hydroxy (Vit-D Deficiency, Fractures)  BMI 24 Continue to recommend diet heavy in fruits and veggies and low in animal meats, cheeses, and dairy products, appropriate calorie intake Discuss exercise recommendations routinely Continue to monitor weight at each visit  B12 deficiency On supplement; clarify dose -     Vitamin B12  Medication management -     CBC with Differential/Platelet -     COMPLETE METABOLIC PANEL WITH GFR -     Magnesium -     Urinalysis, Routine w reflex microscopic  Screening for thyroid disorder -     TSH  Screening for hematuria or proteinuria -     Urinalysis, Routine w reflex microscopic  History of colon polyps Colonoscopy UTD; lifestyle reviewed; encourage high fiber diet  Screening for diabetes mellitus -     Hemoglobin A1c  Screening for cardiovascular condition -     EKG 12-Lead  Orders Placed This Encounter  Procedures   CBC with Differential/Platelet   COMPLETE METABOLIC PANEL WITH GFR   Magnesium   Lipid panel   TSH   Hemoglobin A1c   VITAMIN D 25 Hydroxy (Vit-D Deficiency, Fractures)   Vitamin B12   Urinalysis, Routine w reflex microscopic   EKG 12-Lead    Discussed med's effects and SE's.  Screening labs and tests as requested with regular follow-up as recommended. Over 40 minutes of exam, counseling, chart review, and complex, high level critical decision making was performed this visit.   Future Appointments  Date Time Provider [place]  11/20/2021 11:00 AM Charlcie Cradle, MD [facility] None  10/21/2022 10:00 AM Liane Comber, NP [facility] None     HPI  56 y.o. female  presents for a complete physical. She  has Vitamin D deficiency; Major depression, recurrent ([place]); Hyperlipidemia; Lichen sclerosus of vulva; History of adenomatous polyp of colon; and Alcohol use disorder, mild, abuse on their problem list.   She is married, 2 children 21 y/o and 78 y/o, doing well in school. She works in Science writer, enjoys her job. No concerns today.   She follows annually with GYN, Dr. [name] at [facility], just had annual visit 09/2021 per patient. Did pap and mammogram. Follows as well for lichen sclerosis, responding well to topical steroid   She is recently followed by Dr. [name], psych for recurrent MDD and mild alcohol abuse disorder, following brother passing in 2017, currently prescribed trintellix 20 mg, wellbutrin 150 mg daily and reports is doing well. She is recently on disulfiram and doing well with this.   She had colonoscopy Dr. Silverio Decamp 11/14/2020, had 4 polyps, adenomatous, was referred for appedentoy due to a polyp right at appendiceal orifice and underwent lap appendectomy by Dr. Marcello Moores on 02/20/2021, no concerns since then.   BMI is Body mass index is 24.26  kg/m., she has been working on diet, has cut out wine and seeing some weight loss, does walk dog 30 min daily.  Water - was is up, 2 x 16 fluid ounces (working to increase), rare tea, no soda Caffeine - 1 cup coffee  Minimial red meat, more chicken, lots of fruit, 1 serving veggie daily with dinner  Wt Readings from Last 3 Encounters:  10/21/21 158 lb 6.4 oz (71.8 kg)  02/20/21 150 lb (68 kg)  02/12/21  150 lb (68 kg)   Today their BP is BP: 110/68 She does workout. She denies chest pain, shortness of breath, dizziness.    She is not on cholesterol medication and denies myalgias. Her cholesterol is not at goal. No MI/CVA. The cholesterol last visit was:   Lab Results  Component Value Date   CHOL 224 (H) 10/21/2020   HDL 80 10/21/2020   LDLCALC 127 (H) 10/21/2020   TRIG 76 10/21/2020   CHOLHDL 2.8 10/21/2020   She has been working on diet and exercise for glucose management; she denies diabetic polys. Last A1C in the office was:  Lab Results  Component Value Date   HGBA1C 5.3 10/21/2020   Last GFR: Lab Results  Component Value Date   GFRNONAA 89 10/21/2020   Patient is on Vitamin D supplement, taking 2 caps daily, unsure of dose.   Lab Results  Component Value Date   VD25OH 77 10/21/2020     Was taking B12 supplement, still taking daily supplement pill, unsure of dose Lab Results  Component Value Date   [id number] 562 10/21/2020     Current Medications:  Current Outpatient Medications on File Prior to Visit  Medication Sig Dispense Refill   BIOTIN PO Take 1 capsule by mouth daily.     buPROPion (WELLBUTRIN XL) 150 MG 24 hr tablet Take 1 tablet (150 mg total) by mouth every morning. 90 tablet 0   cholecalciferol (VITAMIN D3) 25 MCG (1000 UNIT) tablet Take 1,000 Units by mouth daily.     disulfiram (ANTABUSE) 250 MG tablet Take 1 tablet (250 mg total) by mouth daily. 30 tablet 1   vitamin B-12 (CYANOCOBALAMIN) 1000 MCG tablet Take 3,000 mcg by mouth daily.     vortioxetine HBr (TRINTELLIX) 20 MG TABS tablet Take 1 tablet (20 mg total) by mouth daily. 90 tablet 0   No current facility-administered medications on file prior to visit.   Allergies:  No Known Allergies Medical History:  She has Vitamin D deficiency; Major depression, recurrent ([place]); Hyperlipidemia; Lichen sclerosus of vulva; History of adenomatous polyp of colon; and Alcohol use disorder, mild, abuse on  their problem list. Health Maintenance:   Immunization History  Administered Date(s) Administered   Influenza Inj Mdck Quad With Preservative 07/13/2017, 08/04/2018   Td 10/21/2020   Tdap 09/14/2010   Health Maintenance  Topic Date Due   Zoster Vaccines- Shingrix (1 of 2) Never done   COVID-19 Vaccine (1) 11/06/2021 (Originally 11/12/1966)   INFLUENZA VACCINE  12/12/2021 (Originally 04/14/2021)   MAMMOGRAM  09/19/2022   PAP SMEAR-Modifier  09/28/2024   COLONOSCOPY (Pts 45-70yrs Insurance coverage will need to be confirmed)  11/14/2025   TETANUS/TDAP  10/21/2030   Hepatitis C Screening  Completed   HIV Screening  Completed   HPV VACCINES  Aged Out   Flu vaccine: declines Shingrix: check with insurance/at pharmacy  Covid 19: declines    Pap: follows GYN annually, last 09/28/2021 [id number] annually at GYN, last 09/28/2021  Colonoscopy: 11/2020, 5 year recall  per Dr. Silverio Decamp, polyps  Last Dental Exam: goes q14m, last 2021 Last Eye Exam: 2021, remote lasik, goes annually Last Derm Exam: Dr. Renda Rolls, goes q90m   Patient Care Team: Unk Pinto, MD as PCP - General (Internal Medicine)  Surgical History:  She has a past surgical history that includes LASIK (2002); Colonoscopy (2007); Wisdom tooth extraction; and laparoscopic appendectomy (N/A, 02/20/2021). Family History:  Herfamily history includes Anxiety disorder in her brother; Bipolar disorder in her sister; Colon polyps (age of onset: 78) in her father; Diabetes in her father; Melanoma (age of onset: 79) in her mother; Obesity in her father. Social History:  She reports that she has never smoked. She has never used smokeless tobacco. She reports that she does not currently use alcohol. She reports that she does not use drugs.  Review of Systems: Review of Systems  Constitutional:  Negative for malaise/fatigue and weight loss.  HENT:  Negative for hearing loss and tinnitus.   Eyes:  Negative for blurred vision and  double vision.  Respiratory:  Negative for cough, shortness of breath and wheezing.   Cardiovascular:  Negative for chest pain, palpitations, orthopnea, claudication and leg swelling.  Gastrointestinal:  Negative for abdominal pain, blood in stool, constipation, diarrhea, heartburn, melena, nausea and vomiting.  Genitourinary: Negative.   Musculoskeletal:  Negative for joint pain and myalgias.  Skin:  Negative for rash.  Neurological:  Negative for dizziness, tingling, sensory change, weakness and headaches.  Endo/Heme/Allergies:  Negative for polydipsia.  Psychiatric/Behavioral: Negative.    All other systems reviewed and are negative.  Physical Exam: Estimated body mass index is 24.26 kg/m as calculated from the following:   Height as of this encounter: 5' 7.75" (1.721 m).   Weight as of this encounter: 158 lb 6.4 oz (71.8 kg). BP 110/68    Pulse 84    Temp 97.7 F (36.5 C)    Ht 5' 7.75" (1.721 m)    Wt 158 lb 6.4 oz (71.8 kg)    SpO2 99%    BMI 24.26 kg/m  General Appearance: Well nourished, in no apparent distress.  Eyes: PERRLA, EOMs, conjunctiva no swelling or erythema Sinuses: No Frontal/maxillary tenderness  ENT/Mouth: Ext aud canals clear, normal light reflex with TMs without erythema, bulging. Good dentition. No erythema, swelling, or exudate on post pharynx. Tonsils not swollen or erythematous. Hearing normal.  Neck: Supple, thyroid normal. No bruits  Respiratory: Respiratory effort normal, BS equal bilaterally without rales, rhonchi, wheezing or stridor.  Cardio: RRR without murmurs, rubs or gallops. Brisk peripheral pulses without edema.  Chest: symmetric, with normal excursions and percussion.  Breasts: Defer to GYN Abdomen: Soft, nontender, no guarding, rebound, hernias, masses, or organomegaly.  Lymphatics: Non tender without lymphadenopathy.  Genitourinary: Defer to GYN Musculoskeletal: Full ROM all peripheral extremities,5/5 strength, and normal gait.  Skin: Warm,  dry without rashes, lesions, ecchymosis. Neuro: Cranial nerves intact, reflexes equal bilaterally. Normal muscle tone, no cerebellar symptoms. Sensation intact.  Psych: Awake and oriented X 3, normal affect, Insight and Judgment appropriate.   EKG: NSR, IRBBB  Izora Ribas, NP 10:50 AM [facility]

## 2021-10-21 ENCOUNTER — Encounter: Payer: Self-pay | Admitting: Adult Health

## 2021-10-21 ENCOUNTER — Encounter: Payer: BC Managed Care – PPO | Admitting: Internal Medicine

## 2021-10-21 ENCOUNTER — Other Ambulatory Visit: Payer: Self-pay

## 2021-10-21 ENCOUNTER — Ambulatory Visit: Payer: BC Managed Care – PPO | Admitting: Adult Health

## 2021-10-21 VITALS — BP 110/68 | HR 84 | Temp 97.7°F | Ht 67.75 in | Wt 158.4 lb

## 2021-10-21 DIAGNOSIS — Z0000 Encounter for general adult medical examination without abnormal findings: Secondary | ICD-10-CM | POA: Diagnosis not present

## 2021-10-21 DIAGNOSIS — Z0001 Encounter for general adult medical examination with abnormal findings: Secondary | ICD-10-CM

## 2021-10-21 DIAGNOSIS — Z6824 Body mass index (BMI) 24.0-24.9, adult: Secondary | ICD-10-CM

## 2021-10-21 DIAGNOSIS — F331 Major depressive disorder, recurrent, moderate: Secondary | ICD-10-CM

## 2021-10-21 DIAGNOSIS — E538 Deficiency of other specified B group vitamins: Secondary | ICD-10-CM

## 2021-10-21 DIAGNOSIS — Z13220 Encounter for screening for lipoid disorders: Secondary | ICD-10-CM | POA: Diagnosis not present

## 2021-10-21 DIAGNOSIS — Z130 Encounter for screening for diseases of the blood and blood-forming organs and certain disorders involving the immune mechanism: Secondary | ICD-10-CM

## 2021-10-21 DIAGNOSIS — Z13228 Encounter for screening for other metabolic disorders: Secondary | ICD-10-CM

## 2021-10-21 DIAGNOSIS — Z1389 Encounter for screening for other disorder: Secondary | ICD-10-CM

## 2021-10-21 DIAGNOSIS — Z6822 Body mass index (BMI) 22.0-22.9, adult: Secondary | ICD-10-CM

## 2021-10-21 DIAGNOSIS — I10 Essential (primary) hypertension: Secondary | ICD-10-CM

## 2021-10-21 DIAGNOSIS — E559 Vitamin D deficiency, unspecified: Secondary | ICD-10-CM

## 2021-10-21 DIAGNOSIS — Z79899 Other long term (current) drug therapy: Secondary | ICD-10-CM | POA: Diagnosis not present

## 2021-10-21 DIAGNOSIS — Z136 Encounter for screening for cardiovascular disorders: Secondary | ICD-10-CM

## 2021-10-21 DIAGNOSIS — E785 Hyperlipidemia, unspecified: Secondary | ICD-10-CM

## 2021-10-21 DIAGNOSIS — F1010 Alcohol abuse, uncomplicated: Secondary | ICD-10-CM

## 2021-10-21 DIAGNOSIS — Z131 Encounter for screening for diabetes mellitus: Secondary | ICD-10-CM | POA: Diagnosis not present

## 2021-10-21 DIAGNOSIS — N904 Leukoplakia of vulva: Secondary | ICD-10-CM

## 2021-10-21 DIAGNOSIS — Z86010 Personal history of colonic polyps: Secondary | ICD-10-CM

## 2021-10-21 NOTE — Patient Instructions (Addendum)
[name] , Thank you for taking time to come for your Medicare Wellness Visit. I appreciate your ongoing commitment to your health goals. Please review the following plan we discussed and let me know if I can assist you in the future.   These are the goals we discussed:  Goals      DIET - EAT MORE FRUITS AND VEGETABLES     DIET - INCREASE WATER INTAKE        This is a list of the screening recommended for you and due dates:  Health Maintenance  Topic Date Due   Zoster (Shingles) Vaccine (1 of 2) Never done   COVID-19 Vaccine (1) 11/06/2021*   Flu Shot  12/12/2021*   Mammogram  09/19/2022   Pap Smear  09/28/2024   Colon Cancer Screening  11/14/2025   Tetanus Vaccine  10/21/2030   Hepatitis C Screening: USPSTF Recommendation to screen - Ages 18-79 yo.  Completed   HIV Screening  Completed   HPV Vaccine  Aged Out  *Topic was postponed. The date shown is not the original due date.    Know what a healthy weight is for you (roughly BMI <25) and aim to maintain this  Aim for 7+ servings of fruits and vegetables daily  65-80+ fluid ounces of water or unsweet tea for healthy kidneys  Limit to max 1 drink of alcohol per day; avoid smoking/tobacco  Limit animal fats in diet for cholesterol and heart health - choose grass fed whenever available  Avoid highly processed foods, and foods high in saturated/trans fats  Aim for low stress - take time to unwind and care for your mental health  Aim for 150 min of moderate intensity exercise weekly for heart health, and weights twice weekly for bone health  Aim for 7-9 hours of sleep daily     A great goal to work towards is aiming to get in a serving daily of some of the most nutritionally dense foods - G- BOMBS daily        Zoster Vaccine, Recombinant injection What is this medication? ZOSTER VACCINE (ZOS ter vak SEEN) is a vaccine used to reduce the risk of getting shingles. This vaccine is not used to treat shingles or  nerve pain from shingles. This medicine may be used for other purposes; ask your health care provider or pharmacist if you have questions. COMMON BRAND NAME(S): [facility] What should I tell my care team before I take this medication? They need to know if you have any of these conditions: cancer immune system problems an unusual or allergic reaction to Zoster vaccine, other medications, foods, dyes, or preservatives pregnant or trying to get pregnant breast-feeding How should I use this medication? This vaccine is injected into a muscle. It is given by a health care provider. A copy of Vaccine Information Statements will be given before each vaccination. Be sure to read this information carefully each time. This sheet may change often. Talk to your health care provider about the use of this vaccine in children. This vaccine is not approved for use in children. Overdosage: If you think you have taken too much of this medicine contact a poison control center or emergency room at once. NOTE: This medicine is only for you. Do not share this medicine with others. What if I miss a dose? Keep appointments for follow-up (booster) doses. It is important not to miss your dose. Call your health care provider if you are unable to keep an appointment. What  may interact with this medication? medicines that suppress your immune system medicines to treat cancer steroid medicines like prednisone or cortisone This list may not describe all possible interactions. Give your health care provider a list of all the medicines, herbs, non-prescription drugs, or dietary supplements you use. Also tell them if you smoke, drink alcohol, or use illegal drugs. Some items may interact with your medicine. What should I watch for while using this medication? Visit your health care provider regularly. This vaccine, like all vaccines, may not fully protect everyone. What side effects may I notice from receiving this  medication? Side effects that you should report to your doctor or health care professional as soon as possible: allergic reactions (skin rash, itching or hives; swelling of the face, lips, or tongue) trouble breathing Side effects that usually do not require medical attention (report these to your doctor or health care professional if they continue or are bothersome): chills headache fever nausea pain, redness, or irritation at site where injected tiredness vomiting This list may not describe all possible side effects. Call your doctor for medical advice about side effects. You may report side effects to FDA at 1-800-FDA-1088. Where should I keep my medication? This vaccine is only given by a health care provider. It will not be stored at home. NOTE: This sheet is a summary. It may not cover all possible information. If you have questions about this medicine, talk to your doctor, pharmacist, or health care provider.  2022 Elsevier/Gold Standard (2021-05-20 00:00:00)

## 2021-10-22 LAB — COMPLETE METABOLIC PANEL WITH GFR
AG Ratio: 1.6 (calc) (ref 1.0–2.5)
ALT: 16 U/L (ref 6–29)
AST: 22 U/L (ref 10–35)
Albumin: 4.7 g/dL (ref 3.6–5.1)
Alkaline phosphatase (APISO): 71 U/L (ref 37–153)
BUN: 15 mg/dL (ref 7–25)
CO2: 29 mmol/L (ref 20–32)
Calcium: 9.9 mg/dL (ref 8.6–10.4)
Chloride: 100 mmol/L (ref 98–110)
Creat: 0.81 mg/dL (ref 0.50–1.03)
Globulin: 2.9 g/dL (calc) (ref 1.9–3.7)
Glucose, Bld: 71 mg/dL (ref 65–99)
Potassium: 3.6 mmol/L (ref 3.5–5.3)
Sodium: 138 mmol/L (ref 135–146)
Total Bilirubin: 0.8 mg/dL (ref 0.2–1.2)
Total Protein: 7.6 g/dL (ref 6.1–8.1)
eGFR: 86 mL/min/{1.73_m2} (ref >=60)

## 2021-10-22 LAB — URINALYSIS, ROUTINE W REFLEX MICROSCOPIC
Bacteria, UA: NONE SEEN /HPF
Bilirubin Urine: NEGATIVE
Glucose, UA: NEGATIVE
Hgb urine dipstick: NEGATIVE
Hyaline Cast: NONE SEEN /LPF
Ketones, ur: NEGATIVE
Nitrite: NEGATIVE
Protein, ur: NEGATIVE
RBC / HPF: NONE SEEN /HPF (ref 0–2)
Specific Gravity, Urine: 1.011 (ref 1.001–1.035)
WBC, UA: NONE SEEN /HPF (ref 0–5)
pH: 6.5 (ref 5.0–8.0)

## 2021-10-22 LAB — CBC WITH DIFFERENTIAL/PLATELET
Absolute Monocytes: 538 cells/uL (ref 200–950)
Basophils Absolute: 78 cells/uL (ref 0–200)
Basophils Relative: 1.4 %
Eosinophils Absolute: 101 cells/uL (ref 15–500)
Eosinophils Relative: 1.8 %
HCT: 40.6 % (ref 35.0–45.0)
Hemoglobin: 13.6 g/dL (ref 11.7–15.5)
Lymphs Abs: 1758 cells/uL (ref 850–3900)
MCH: 30.1 pg (ref 27.0–33.0)
MCHC: 33.5 g/dL (ref 32.0–36.0)
MCV: 89.8 fL (ref 80.0–100.0)
MPV: 10.1 fL (ref 7.5–12.5)
Monocytes Relative: 9.6 %
Neutro Abs: 3125 cells/uL (ref 1500–7800)
Neutrophils Relative %: 55.8 %
Platelets: 263 10*3/uL (ref 140–400)
RBC: 4.52 10*6/uL (ref 3.80–5.10)
RDW: 11.2 % (ref 11.0–15.0)
Total Lymphocyte: 31.4 %
WBC: 5.6 10*3/uL (ref 3.8–10.8)

## 2021-10-22 LAB — LIPID PANEL
Cholesterol: 229 mg/dL — ABNORMAL HIGH (ref <200)
HDL: 89 mg/dL (ref >=50)
LDL Cholesterol (Calc): 126 mg/dL (calc) — ABNORMAL HIGH
Non-HDL Cholesterol (Calc): 140 mg/dL (calc) — ABNORMAL HIGH (ref <130)
Total CHOL/HDL Ratio: 2.6 (calc) (ref <5.0)
Triglycerides: 58 mg/dL (ref <150)

## 2021-10-22 LAB — MICROSCOPIC MESSAGE

## 2021-10-22 LAB — VITAMIN D 25 HYDROXY (VIT D DEFICIENCY, FRACTURES): Vit D, 25-Hydroxy: 80 ng/mL (ref 30–100)

## 2021-10-22 LAB — HEMOGLOBIN A1C
Hgb A1c MFr Bld: 5.4 % of total Hgb (ref <5.7)
Mean Plasma Glucose: 108 mg/dL
eAG (mmol/L): 6 mmol/L

## 2021-10-22 LAB — MAGNESIUM: Magnesium: 2 mg/dL (ref 1.5–2.5)

## 2021-10-22 LAB — VITAMIN B12: Vitamin B-12: 1063 pg/mL (ref 200–1100)

## 2021-10-22 LAB — TSH: TSH: 1.85 mIU/L

## 2021-11-11 DIAGNOSIS — L814 Other melanin hyperpigmentation: Secondary | ICD-10-CM | POA: Diagnosis not present

## 2021-11-11 DIAGNOSIS — L821 Other seborrheic keratosis: Secondary | ICD-10-CM | POA: Diagnosis not present

## 2021-11-11 DIAGNOSIS — L578 Other skin changes due to chronic exposure to nonionizing radiation: Secondary | ICD-10-CM | POA: Diagnosis not present

## 2021-11-11 DIAGNOSIS — D229 Melanocytic nevi, unspecified: Secondary | ICD-10-CM | POA: Diagnosis not present

## 2021-11-20 ENCOUNTER — Telehealth (HOSPITAL_COMMUNITY): Payer: BC Managed Care – PPO | Admitting: Psychiatry

## 2021-12-11 ENCOUNTER — Telehealth (HOSPITAL_BASED_OUTPATIENT_CLINIC_OR_DEPARTMENT_OTHER): Payer: BC Managed Care – PPO | Admitting: Psychiatry

## 2021-12-11 DIAGNOSIS — F331 Major depressive disorder, recurrent, moderate: Secondary | ICD-10-CM

## 2021-12-11 DIAGNOSIS — F1010 Alcohol abuse, uncomplicated: Secondary | ICD-10-CM

## 2021-12-11 MED ORDER — DISULFIRAM 250 MG PO TABS: 250.0000 mg | ORAL_TABLET | Freq: Every day | ORAL | 0 refills | Status: DC

## 2021-12-11 MED ORDER — BUPROPION HCL ER (XL) 300 MG PO TB24: 300.0000 mg | ORAL_TABLET | ORAL | 0 refills | Status: DC

## 2021-12-11 MED ORDER — VORTIOXETINE HBR 20 MG PO TABS: 20.0000 mg | ORAL_TABLET | Freq: Every day | ORAL | 0 refills | Status: DC

## 2021-12-11 NOTE — Progress Notes (Signed)
Virtual Visit via Video Note ? ?I connected with Jacqueline Banks on 12/11/21 at  8:30 AM EDT by a video enabled telemedicine application and verified that I am speaking with the correct person using two identifiers. ? ?Location: ?Patient: home ?Provider: office ?  ?I discussed the limitations of evaluation and management by telemedicine and the availability of in person appointments. The patient expressed understanding and agreed to proceed. ? ?History of Present Illness: ?"I was good, then I was bad, and now I am in the middle". Jacqueline Banks states she didn't drank the whole month of January and was taking 1/4 tab of Antabuse. In February she decided she wanted a drink for Valentines day and her anniversary. She stopped the Antabuse. It lead to her not drinking Sunday thru Wednesday but on Thursday, Friday and Saturday she would drink a whole bottle of wine each night. In March she would take Antabuse for 1 week and then forget on the weekend and start drinking again.  Jacqueline Banks is going to go visit her sister in April and they plan to stop all alcohol together.  She is embarrassed by her behavior. Jacqueline Banks is wondering if she is depressed. Trintellix and Wellbutrin help. She is endorsing a lot of negative self thoughts and decrease in personal hygiene habits and low motivation. On nights she drinks alcohol she has trouble falling asleep but overall her sleep is good. Her energy is poor. Her appetite is unchanged. Her concentration is not as good as before.  She denies SI/HI.  ?  ?Observations/Objective: ?Psychiatric Specialty Exam: ?ROS  ?There were no vitals taken for this visit.There is no height or weight on file to calculate BMI.  ?General Appearance: Casual and Disheveled  ?Eye Contact:  Good  ?Speech:  Clear and Coherent and Normal Rate  ?Volume:  Normal  ?Mood:  Depressed  ?Affect:  Full Range  ?Thought Process:  Goal Directed, Linear, and Descriptions of Associations: Intact  ?Orientation:  Full (Time, Place, and  Person)  ?Thought Content:  Logical  ?Suicidal Thoughts:  No  ?Homicidal Thoughts:  No  ?Memory:  Immediate;   Good  ?Judgement:  Good  ?Insight:  Good  ?Psychomotor Activity:  Normal  ?Concentration:  Concentration: Good  ?Recall:  Good  ?Fund of Knowledge:  Good  ?Language:  Good  ?Akathisia:  No  ?Handed:  Right  ?AIMS (if indicated):     ?Assets:  Communication Skills ?Desire for Improvement ?Financial Resources/Insurance ?Housing ?Intimacy ?Leisure Time ?Resilience ?Social Support ?Talents/Skills ?Transportation ?Vocational/Educational  ?ADL's:  Intact  ?Cognition:  WNL  ?Sleep:     ? ? ? ?Assessment and Plan: ? ? ?  12/11/2021  ?  8:41 AM 10/21/2021  ? 10:21 AM 09/25/2021  ?  3:39 PM 06/17/2021  ? 11:06 AM 06/12/2021  ?  3:09 PM  ?Depression screen PHQ 2/9  ?Decreased Interest 1 0 0 0 0  ?Down, Depressed, Hopeless 1 0 0 0 0  ?PHQ - 2 Score 2 0 0 0 0  ?Altered sleeping 1   0   ?Tired, decreased energy 3   0   ?Change in appetite 0   0   ?Feeling bad or failure about yourself  3   0   ?Trouble concentrating 2   0   ?Moving slowly or fidgety/restless 0   0   ?Suicidal thoughts 0   0   ?PHQ-9 Score 11   0   ?Difficult doing work/chores Very difficult   Not difficult at all   ? ? ?  Flowsheet Row Video Visit from 12/11/2021 in [facility] Video Visit from 09/25/2021 in [facility] Counselor from 06/17/2021 in [facility]  ?C-SSRS RISK CATEGORY No Risk No Risk No Risk  ? ?  ? ?Jacqueline Banks wants to continue Antabuse because it is effective when she takes it.  ? ?Increase Wellbutrin XL '300mg'$  po qD for mood ? ?Jacqueline Banks is not ready to try AA ? ? ?1. MDD (major depressive disorder), recurrent episode, moderate (HCC) ?- buPROPion (WELLBUTRIN XL) 300 MG 24 hr tablet; Take 1 tablet (300 mg total) by mouth every morning.  Dispense: 90 tablet; Refill: 0 ?- vortioxetine HBr (TRINTELLIX) 20 MG TABS tablet; Take 1 tablet (20 mg  total) by mouth daily.  Dispense: 90 tablet; Refill: 0 ? ?2. Alcohol use disorder, mild, abuse ?- disulfiram (ANTABUSE) 250 MG tablet; Take 1 tablet (250 mg total) by mouth daily.  Dispense: 90 tablet; Refill: 0 ? ? ? ?Follow Up Instructions: ?In 1-2 months or sooner if needed ?  ? ?Collaboration of Care: Referral or follow-up with counselor/therapist AEB therapy for depression . She has not had good success with substance abuse therapy in the past and doesn't want it at this time.  ? ?Patient/Guardian was advised Release of Information must be obtained prior to any record release in order to collaborate their care with an outside provider. Patient/Guardian was advised if they have not already done so to contact the registration department to sign all necessary forms in order for [place] to release information regarding their care.  ? ?Consent: Patient/Guardian gives verbal consent for treatment and assignment of benefits for services provided during this visit. Patient/Guardian expressed understanding and agreed to proceed.  ? ?I discussed the assessment and treatment plan with the patient. The patient was provided an opportunity to ask questions and all were answered. The patient agreed with the plan and demonstrated an understanding of the instructions. ?  ?The patient was advised to call back or seek an in-person evaluation if the symptoms worsen or if the condition fails to improve as anticipated. ? ?I provided 21 minutes of non-face-to-face time during this encounter. ? ? ?Charlcie Cradle, MD ? ?

## 2021-12-15 ENCOUNTER — Telehealth (HOSPITAL_COMMUNITY): Payer: Self-pay | Admitting: Psychiatry

## 2021-12-15 NOTE — Telephone Encounter (Signed)
12/15/21 ? ?At the request of Dr,. Doyne Keel the patient was mailed and list of therapists to call to schedule and appt due to our office is short staff.../sh  ?

## 2022-01-06 ENCOUNTER — Encounter: Payer: Self-pay | Admitting: Internal Medicine

## 2022-01-06 ENCOUNTER — Ambulatory Visit: Payer: BC Managed Care – PPO | Admitting: Internal Medicine

## 2022-01-06 VITALS — BP 122/72 | HR 70 | Temp 97.9°F | Resp 16 | Ht 67.75 in | Wt 161.4 lb

## 2022-01-06 DIAGNOSIS — R1314 Dysphagia, pharyngoesophageal phase: Secondary | ICD-10-CM

## 2022-01-06 NOTE — Progress Notes (Signed)
? ? ?  Future Appointments  ?Date Time Provider Department  ?01/06/2022 11:30 AM Unk Pinto, MD [facility]  ?01/15/2022  9:30 AM Charlcie Cradle, MD [facility]  ?10/21/2022 10:00 AM Liane Comber, NP [facility]  ? ? ?History of Present Illness: ? ?   Patient is a very nice 56 yo MWF  with hx/o labile HTN, mild HLD, Depression & Vitamin D Deficiency  who presents with 3-4 month hx of  dry coughing paroxysms and a sensation like something is "stuck" in the back of her throat & points to the upper neck area.  Denies post-nasal drainage, sputum, congestion, hoarseness or snoring. No reflux sx's. Has remote 10 yr smoking hx , quitting about 2004.   ? ?Medications ? ?  vitamin B-12 1000 MCG tablet, Take 3,000 mcg  daily. ?  BIOTIN , Take 1 capsule by mouth daily. ?  buPROPion XL) 300 MG 24 hr tablet, Take 1 tablet  every morning. ?  VITAMIN D 1000 u, Take  daily. ?  disulfiram (ANTABUSE) 250 MG , Take 1 tablet  daily. ?  TRINTELLIX 20 MG , Take 1 tablet daily. ? ?Problem list ?She has Vitamin D deficiency; Major depression, recurrent ([place]); Hyperlipidemia; Lichen sclerosus of vulva; History of adenomatous polyp of colon; and Alcohol use disorder, mild, abuse on their problem list. ?  ?Observations/Objective: ? ?BP 122/72   Pulse 70   Temp 97.9 ?F (36.6 ?C)   Resp 16   Ht 5' 7.75" (1.721 m)   Wt 161 lb 6.4 oz (73.2 kg)   SpO2 97%   BMI 24.72 kg/m?  ? ?HEENT -  EACs Patent /TMs - Nl.   EOMs -Nl /conjugate . PERRLA.  ?                 - No sinus tenderness.  N/O/P clear  ?Neck - supple. Thyroid Nl - No lymphadenopathy.  ?Chest - Clear equal BS. ?Cor - Nl HS. RRR w/o sig MGR.  ?MS- FROM w/o deformities.  Gait Nl. ?Neuro -  Nl w/o focal abnormalities. ? ?Assessment and Plan: ? ? ?1. Pharyngoesophageal dysphagia ? ?- Ambulatory referral to ENT ?                             ( May need Laryngoscopsy )  ? ?Follow Up Instructions: ?  ?    I discussed the assessment and treatment plan with the patient. The patient was provided  an opportunity to ask questions and all were answered. The patient agreed with the plan and demonstrated an understanding of the instructions. ?  ?    The patient was advised to call back or seek an in-person evaluation if the symptoms worsen or if the condition fails to improve as anticipated. ? ? ?Kirtland Bouchard, MD ? ?

## 2022-01-15 ENCOUNTER — Telehealth (HOSPITAL_COMMUNITY): Payer: BC Managed Care – PPO | Admitting: Psychiatry

## 2022-01-29 ENCOUNTER — Telehealth (HOSPITAL_BASED_OUTPATIENT_CLINIC_OR_DEPARTMENT_OTHER): Payer: BC Managed Care – PPO | Admitting: Psychiatry

## 2022-01-29 ENCOUNTER — Telehealth (HOSPITAL_COMMUNITY): Payer: BC Managed Care – PPO | Admitting: Psychiatry

## 2022-01-29 DIAGNOSIS — F331 Major depressive disorder, recurrent, moderate: Secondary | ICD-10-CM

## 2022-01-29 DIAGNOSIS — F1010 Alcohol abuse, uncomplicated: Secondary | ICD-10-CM

## 2022-01-29 MED ORDER — BUPROPION HCL ER (XL) 300 MG PO TB24: 300.0000 mg | ORAL_TABLET | ORAL | 0 refills | Status: DC

## 2022-01-29 MED ORDER — VORTIOXETINE HBR 20 MG PO TABS: 20.0000 mg | ORAL_TABLET | Freq: Every day | ORAL | 0 refills | Status: DC

## 2022-01-29 NOTE — Progress Notes (Signed)
Virtual Visit via Video Note  I connected with Jacqueline Banks on 01/29/22 at  4:00 PM EDT by a video enabled telemedicine application and verified that I am speaking with the correct person using two identifiers.  Location: Patient: home Provider: office   I discussed the limitations of evaluation and management by telemedicine and the availability of in person appointments. The patient expressed understanding and agreed to proceed.  History of Present Illness: "Very good. Much, much better". Jacqueline Banks has been doing positive things. She has been active and doing things she enjoys. Jacqueline Banks has not had any alcohol in 6 weeks. She is very happy with her progress. Jacqueline Banks went to [place] and was drinking with her family in March. When she came back she decided it was time to change and has not any alcohol since then. Jacqueline Banks is no longer feeling guilty or having self hate. She wakes up in the morning feeling good. She has replaced the wine with sleepy time tea in her bedtime routine. [name] denies any craving for alcohol. She denies depression and anxiety. She is sleeping well but is very tired all the time. Appetite is good. She is focused and work is going well. She denies SI/HI. As a present to herself she is getting a chin lift with her "wine money".    Observations/Objective: Psychiatric Specialty Exam: ROS  There were no vitals taken for this visit.There is no height or weight on file to calculate BMI.  General Appearance: Neat and Well Groomed  Eye Contact:  Good  Speech:  Clear and Coherent and Normal Rate  Volume:  Normal  Mood:  Euthymic  Affect:  Full Range  Thought Process:  Goal Directed, Linear, and Descriptions of Associations: Intact  Orientation:  Full (Time, Place, and Person)  Thought Content:  Logical  Suicidal Thoughts:  No  Homicidal Thoughts:  No  Memory:  Immediate;   Good  Judgement:  Good  Insight:  Good  Psychomotor Activity:  Normal  Concentration:  Concentration: Good   Recall:  Good  Fund of Knowledge:  Good  Language:  Good  Akathisia:  No  Handed:  Right  AIMS (if indicated):     Assets:  Communication Skills Desire for Improvement Financial Resources/Insurance Housing Intimacy Leisure Time Resilience Social Support Talents/Skills Transportation Vocational/Educational  ADL's:  Intact  Cognition:  WNL  Sleep:        Assessment and Plan:     01/29/2022    4:09 PM 01/29/2022    4:07 PM 12/11/2021    8:41 AM 10/21/2021   10:21 AM 09/25/2021    3:39 PM  Depression screen PHQ 2/9  Decreased Interest 0 0 1 0 0  Down, Depressed, Hopeless 0  1 0 0  PHQ - 2 Score 0 0 2 0 0  Altered sleeping 0 0 1    Tired, decreased energy '3 2 3    '$ Change in appetite 0  0    Feeling bad or failure about yourself  0  3    Trouble concentrating 0  2    Moving slowly or fidgety/restless 0  0    Suicidal thoughts 0  0    PHQ-9 Score '3 2 11    '$ Difficult doing work/chores Not difficult at all  Very difficult      Flowsheet Row Video Visit from 01/29/2022 in [facility] Video Visit from 12/11/2021 in [facility] Video Visit from 09/25/2021 in [facility]  C-SSRS RISK CATEGORY Error: Question 6 not populated No Risk No Risk         Status of current problems: significant improvement  Meds:  1. MDD (major depressive disorder), recurrent episode, moderate (HCC) - buPROPion (WELLBUTRIN XL) 300 MG 24 hr tablet; Take 1 tablet (300 mg total) by mouth every morning.  Dispense: 90 tablet; Refill: 0 - vortioxetine HBr (TRINTELLIX) 20 MG TABS tablet; Take 1 tablet (20 mg total) by mouth daily.  Dispense: 90 tablet; Refill: 0  2. Alcohol use disorder, mild, abuse  - no refill on Antabuse as she is only taking 1/4 tab daily and it is effective.   Labs: none    Therapy: brief supportive therapy provided.   Collaboration of Care: Other asking  for a form to be filled out for a chin lift   Patient/Guardian was advised Release of Information must be obtained prior to any record release in order to collaborate their care with an outside provider. Patient/Guardian was advised if they have not already done so to contact the registration department to sign all necessary forms in order for [place] to release information regarding their care.   Consent: Patient/Guardian gives verbal consent for treatment and assignment of benefits for services provided during this visit. Patient/Guardian expressed understanding and agreed to proceed.    Follow Up Instructions: Follow up in 3 months  or sooner if needed    I discussed the assessment and treatment plan with the patient. The patient was provided an opportunity to ask questions and all were answered. The patient agreed with the plan and demonstrated an understanding of the instructions.   The patient was advised to call back or seek an in-person evaluation if the symptoms worsen or if the condition fails to improve as anticipated.  I provided 13 minutes of non-face-to-face time during this encounter.   Charlcie Cradle, MD

## 2022-02-05 DIAGNOSIS — K219 Gastro-esophageal reflux disease without esophagitis: Secondary | ICD-10-CM | POA: Diagnosis not present

## 2022-02-05 DIAGNOSIS — R0989 Other specified symptoms and signs involving the circulatory and respiratory systems: Secondary | ICD-10-CM | POA: Diagnosis not present

## 2022-02-19 DIAGNOSIS — M71329 Other bursal cyst, unspecified elbow: Secondary | ICD-10-CM | POA: Diagnosis not present

## 2022-02-19 DIAGNOSIS — L237 Allergic contact dermatitis due to plants, except food: Secondary | ICD-10-CM | POA: Diagnosis not present

## 2022-03-05 NOTE — Progress Notes (Unsigned)
Assessment and Plan:  There are no diagnoses linked to this encounter.    Further disposition pending results of labs. Discussed med's effects and SE's.   Over 30 minutes of exam, counseling, chart review, and critical decision making was performed.   Future Appointments  Date Time Provider [place]  03/09/2022  9:00 AM Alycia Rossetti, NP [facility] None  04/30/2022 11:00 AM Charlcie Cradle, MD [facility] None  10/22/2022  2:00 PM Unk Pinto, MD [facility] None    ------------------------------------------------------------------------------------------------------------------   HPI There were no vitals taken for this visit. 56 y.o.female presents for  Past Medical History:  Diagnosis Date   Depression    pt denies    Eczema    Vitamin D deficiency      No Known Allergies  Current Outpatient Medications on File Prior to Visit  Medication Sig   BIOTIN PO Take 1 capsule by mouth daily.   buPROPion (WELLBUTRIN XL) 300 MG 24 hr tablet Take 1 tablet (300 mg total) by mouth every morning.   cholecalciferol (VITAMIN D3) 25 MCG (1000 UNIT) tablet Take 1,000 Units by mouth daily.   disulfiram (ANTABUSE) 250 MG tablet Take 1 tablet (250 mg total) by mouth daily.   vitamin B-12 (CYANOCOBALAMIN) 1000 MCG tablet Take 3,000 mcg by mouth daily.   vortioxetine HBr (TRINTELLIX) 20 MG TABS tablet Take 1 tablet (20 mg total) by mouth daily.   No current facility-administered medications on file prior to visit.    ROS: all negative except above.   Physical Exam:  There were no vitals taken for this visit.  General Appearance: Well nourished, in no apparent distress. Eyes: PERRLA, EOMs, conjunctiva no swelling or erythema Sinuses: No Frontal/maxillary tenderness ENT/Mouth: Ext aud canals clear, TMs without erythema, bulging. No erythema, swelling, or exudate on post pharynx.  Tonsils not swollen or erythematous. Hearing normal.  Neck: Supple, thyroid normal.   Respiratory: Respiratory effort normal, BS equal bilaterally without rales, rhonchi, wheezing or stridor.  Cardio: RRR with no MRGs. Brisk peripheral pulses without edema.  Abdomen: Soft, + BS.  Non tender, no guarding, rebound, hernias, masses. Lymphatics: Non tender without lymphadenopathy.  Musculoskeletal: Full ROM, 5/5 strength, normal gait.  Skin: Warm, dry without rashes, lesions, ecchymosis.  Neuro: Cranial nerves intact. Normal muscle tone, no cerebellar symptoms. Sensation intact.  Psych: Awake and oriented X 3, normal affect, Insight and Judgment appropriate.     Alycia Rossetti, NP 10:12 AM [facility]

## 2022-03-09 ENCOUNTER — Ambulatory Visit: Payer: BC Managed Care – PPO | Admitting: Nurse Practitioner

## 2022-03-09 ENCOUNTER — Encounter: Payer: Self-pay | Admitting: Nurse Practitioner

## 2022-03-09 VITALS — BP 102/58 | HR 76 | Temp 97.3°F | Wt 155.2 lb

## 2022-03-09 DIAGNOSIS — Z01810 Encounter for preprocedural cardiovascular examination: Secondary | ICD-10-CM | POA: Diagnosis not present

## 2022-03-09 DIAGNOSIS — Z01818 Encounter for other preprocedural examination: Secondary | ICD-10-CM | POA: Diagnosis not present

## 2022-03-09 DIAGNOSIS — Z79899 Other long term (current) drug therapy: Secondary | ICD-10-CM | POA: Diagnosis not present

## 2022-03-09 DIAGNOSIS — Z01812 Encounter for preprocedural laboratory examination: Secondary | ICD-10-CM | POA: Diagnosis not present

## 2022-03-10 ENCOUNTER — Other Ambulatory Visit: Payer: Self-pay | Admitting: Nurse Practitioner

## 2022-03-10 ENCOUNTER — Other Ambulatory Visit: Payer: BC Managed Care – PPO

## 2022-03-10 ENCOUNTER — Other Ambulatory Visit: Payer: Self-pay

## 2022-03-10 DIAGNOSIS — Z01812 Encounter for preprocedural laboratory examination: Secondary | ICD-10-CM

## 2022-03-10 LAB — COMPLETE METABOLIC PANEL WITH GFR
AG Ratio: 1.9 (calc) (ref 1.0–2.5)
ALT: 14 U/L (ref 6–29)
AST: 17 U/L (ref 10–35)
Albumin: 4.5 g/dL (ref 3.6–5.1)
Alkaline phosphatase (APISO): 71 U/L (ref 37–153)
BUN: 18 mg/dL (ref 7–25)
CO2: 29 mmol/L (ref 20–32)
Calcium: 9.4 mg/dL (ref 8.6–10.4)
Chloride: 104 mmol/L (ref 98–110)
Creat: 0.86 mg/dL (ref 0.50–1.03)
Globulin: 2.4 g/dL (calc) (ref 1.9–3.7)
Glucose, Bld: 87 mg/dL (ref 65–99)
Potassium: 4 mmol/L (ref 3.5–5.3)
Sodium: 139 mmol/L (ref 135–146)
Total Bilirubin: 0.5 mg/dL (ref 0.2–1.2)
Total Protein: 6.9 g/dL (ref 6.1–8.1)
eGFR: 80 mL/min/{1.73_m2} (ref >=60)

## 2022-03-10 LAB — CBC WITH DIFFERENTIAL/PLATELET
Absolute Monocytes: 318 cells/uL (ref 200–950)
Basophils Absolute: 70 cells/uL (ref 0–200)
Basophils Relative: 1.9 %
Eosinophils Absolute: 118 cells/uL (ref 15–500)
Eosinophils Relative: 3.2 %
HCT: 37.7 % (ref 35.0–45.0)
Hemoglobin: 12.8 g/dL (ref 11.7–15.5)
Lymphs Abs: 1691 cells/uL (ref 850–3900)
MCH: 30.6 pg (ref 27.0–33.0)
MCHC: 34 g/dL (ref 32.0–36.0)
MCV: 90.2 fL (ref 80.0–100.0)
MPV: 10.3 fL (ref 7.5–12.5)
Monocytes Relative: 8.6 %
Neutro Abs: 1502 cells/uL (ref 1500–7800)
Neutrophils Relative %: 40.6 %
Platelets: 228 10*3/uL (ref 140–400)
RBC: 4.18 10*6/uL (ref 3.80–5.10)
RDW: 11.8 % (ref 11.0–15.0)
Total Lymphocyte: 45.7 %
WBC: 3.7 10*3/uL — ABNORMAL LOW (ref 3.8–10.8)

## 2022-03-10 LAB — PROTIME-INR
INR: 1.1
Prothrombin Time: 11.3 s (ref 9.0–11.5)

## 2022-03-10 LAB — APTT: aPTT: 27 s (ref 23–32)

## 2022-03-13 ENCOUNTER — Telehealth: Payer: Self-pay | Admitting: Gastroenterology

## 2022-03-13 NOTE — Telephone Encounter (Signed)
Revised colonoscopy recall as resected appendiceal cuff with residual ligamentous tissue.  Due for recall colonoscopy June 2023

## 2022-03-14 HISTORY — PX: OTHER SURGICAL HISTORY: SHX169

## 2022-04-09 ENCOUNTER — Encounter: Payer: Self-pay | Admitting: Gastroenterology

## 2022-04-30 ENCOUNTER — Telehealth (HOSPITAL_BASED_OUTPATIENT_CLINIC_OR_DEPARTMENT_OTHER): Payer: BC Managed Care – PPO | Admitting: Psychiatry

## 2022-04-30 DIAGNOSIS — F331 Major depressive disorder, recurrent, moderate: Secondary | ICD-10-CM

## 2022-04-30 DIAGNOSIS — F1010 Alcohol abuse, uncomplicated: Secondary | ICD-10-CM

## 2022-04-30 MED ORDER — DISULFIRAM 250 MG PO TABS: 250.0000 mg | ORAL_TABLET | Freq: Every day | ORAL | 0 refills | Status: DC

## 2022-04-30 NOTE — Progress Notes (Signed)
Virtual Visit via Video Note  I connected with Jacqueline Banks on 04/30/22 at 11:00 AM EDT by   a video enabled telemedicine application and verified that I am speaking with the correct person using two identifiers.  Location: Patient: Home Provider: office   I discussed the limitations of evaluation and management by telemedicine and the availability of in person appointments. The patient expressed understanding and agreed to proceed.  History of Present Illness: "I am starting to spin". Jacqueline Banks was not drinking and doing great until the middle of July. She was missing doses of meds starting in mid July. 1 week before going to [place] she stopped taking Antabuse. She went to [place] and completely forgot her meds and was drinking wine every night again. She missed 3 weeks of meds completely.  This week she took her meds for 3 out of 4 days. She is feeling bad and sad this week. She thinks she is getting depressed. Jacqueline Banks is drinking wine again most nights. Jacqueline Banks is having 2 glasses and really likes the taste. She then goes to bed. She is wanting to restart Antabuse again. It feels hard to remember to take it. She never told her husband she was taking Antabuse but can use her sister for accountability. She denies SI/HI.    Observations/Objective: Psychiatric Specialty Exam: ROS  There were no vitals taken for this visit.There is no height or weight on file to calculate BMI.  General Appearance: Fairly Groomed  Eye Contact:  Good  Speech:  Clear and Coherent and Normal Rate  Volume:  Normal  Mood:  Depressed  Affect:  Full Range  Thought Process:  Coherent, Linear, and Descriptions of Associations: Intact  Orientation:  Full (Time, Place, and Person)  Thought Content:  Rumination  Suicidal Thoughts:  No  Homicidal Thoughts:  No  Memory:  Immediate;   Good  Judgement:  Good  Insight:  Good  Psychomotor Activity:  Normal  Concentration:  Concentration: Good  Recall:  Good  Fund of Knowledge:   Good  Language:  Good  Akathisia:  No  Handed:  Right  AIMS (if indicated):     Assets:  Communication Skills Desire for Improvement Financial Resources/Insurance Housing Intimacy Leisure Time Resilience Social Support Talents/Skills Transportation Vocational/Educational  ADL's:  Intact  Cognition:  WNL  Sleep:        Assessment and Plan:     04/30/2022   11:48 AM 01/29/2022    4:09 PM 01/29/2022    4:07 PM 12/11/2021    8:41 AM 10/21/2021   10:21 AM  Depression screen PHQ 2/9  Decreased Interest 0 0 0 1 0  Down, Depressed, Hopeless 1 0  1 0  PHQ - 2 Score 1 0 0 2 0  Altered sleeping  0 0 1   Tired, decreased energy  3 2 3    Change in appetite  0  0   Feeling bad or failure about yourself   0  3   Trouble concentrating  0  2   Moving slowly or fidgety/restless  0  0   Suicidal thoughts  0  0   PHQ-9 Score  3 2 11    Difficult doing work/chores  Not difficult at all  Very difficult     Flowsheet Row Video Visit from 01/29/2022 in [facility] Video Visit from 12/11/2021 in [facility] Video Visit from 09/25/2021 in [facility] Error: Question 6  not populated No Risk No Risk         Pt is aware that these meds carry a teratogenic risk. Pt will discuss plan of action if she does or plans to become pregnant in the future.  Status of current problems: has started drinking alcohol again and is reporting some depression.   Meds: restart Anatabuse 215m po qD for alcohol abuse Continue Wellbutrin XL 3021mpo qD Trintellix 2040mo qD  1. Alcohol use disorder, mild, abuse - disulfiram (ANTABUSE) 250 MG tablet; Take 1 tablet (250 mg total) by mouth daily.  Dispense: 90 tablet; Refill: 0  2. MDD (major depressive disorder), recurrent episode, moderate (HCC[place]   Labs: none today    Therapy: brief supportive therapy provided.    Recommended pt stop all drug and alcohol use. She is going to restart Antabuse and use her sister as an accountability partner.    Collaboration of Care: Other none today  Patient/Guardian was advised Release of Information must be obtained prior to any record release in order to collaborate their care with an outside provider. Patient/Guardian was advised if they have not already done so to contact the registration department to sign all necessary forms in order for us [place] release information regarding their care.   Consent: Patient/Guardian gives verbal consent for treatment and assignment of benefits for services provided during this visit. Patient/Guardian expressed understanding and agreed to proceed.      Follow Up Instructions: Follow up in 2 weeks or sooner if needed    I discussed the assessment and treatment plan with the patient. The patient was provided an opportunity to ask questions and all were answered. The patient agreed with the plan and demonstrated an understanding of the instructions.   The patient was advised to call back or seek an in-person evaluation if the symptoms worsen or if the condition fails to improve as anticipated.  I provided 16 minutes of non-face-to-face time during this encounter.   SalCharlcie CradleD

## 2022-05-08 ENCOUNTER — Ambulatory Visit (AMBULATORY_SURGERY_CENTER): Payer: BC Managed Care – PPO | Admitting: *Deleted

## 2022-05-08 VITALS — Ht 68.0 in | Wt 155.0 lb

## 2022-05-08 DIAGNOSIS — Z86010 Personal history of colonic polyps: Secondary | ICD-10-CM

## 2022-05-08 NOTE — Progress Notes (Signed)
Patient's pre-visit was done today over the phone with the patient. Name,DOB and address verified. Patient denies any allergies to Eggs and Soy. Patient denies any problems with anesthesia/sedation. Patient is not taking any diet pills or blood thinners. No home Oxygen.  Went over prep instructions with patient. Prep instructions sent to pt's MyChart & mailed to pt-pt is aware. Patient understands to call us back with any questions or concerns. Patient is aware of our care-partner policy.

## 2022-05-14 ENCOUNTER — Encounter: Payer: Self-pay | Admitting: Gastroenterology

## 2022-05-14 ENCOUNTER — Telehealth (HOSPITAL_COMMUNITY): Payer: Self-pay | Admitting: Psychiatry

## 2022-05-28 ENCOUNTER — Telehealth (HOSPITAL_COMMUNITY): Payer: Self-pay | Admitting: Psychiatry

## 2022-05-29 ENCOUNTER — Ambulatory Visit (AMBULATORY_SURGERY_CENTER): Payer: BC Managed Care – PPO | Admitting: Gastroenterology

## 2022-05-29 ENCOUNTER — Encounter: Payer: Self-pay | Admitting: Gastroenterology

## 2022-05-29 VITALS — BP 109/62 | HR 65 | Temp 97.8°F | Resp 9 | Ht 68.0 in | Wt 155.0 lb

## 2022-05-29 DIAGNOSIS — K639 Disease of intestine, unspecified: Secondary | ICD-10-CM

## 2022-05-29 DIAGNOSIS — Z86010 Personal history of colonic polyps: Secondary | ICD-10-CM | POA: Diagnosis not present

## 2022-05-29 DIAGNOSIS — Z1211 Encounter for screening for malignant neoplasm of colon: Secondary | ICD-10-CM | POA: Diagnosis not present

## 2022-05-29 DIAGNOSIS — Z09 Encounter for follow-up examination after completed treatment for conditions other than malignant neoplasm: Secondary | ICD-10-CM | POA: Diagnosis not present

## 2022-05-29 MED ORDER — SODIUM CHLORIDE 0.9 % IV SOLN: 500.0000 mL | Freq: Once | INTRAVENOUS | Status: DC

## 2022-05-29 NOTE — Progress Notes (Signed)
A and O x3. Report to RN. Tolerated MAC anesthesia well. 

## 2022-05-29 NOTE — Patient Instructions (Signed)
YOU HAD AN ENDOSCOPIC PROCEDURE TODAY AT [facility]:   Refer to the procedure report that was given to you for any specific questions about what was found during the examination.  If the procedure report does not answer your questions, please call your gastroenterologist to clarify.  If you requested that your care partner not be given the details of your procedure findings, then the procedure report has been included in a sealed envelope for you to review at your convenience later.  YOU SHOULD EXPECT: Some feelings of bloating in the abdomen. Passage of more gas than usual.  Walking can help get rid of the air that was put into your GI tract during the procedure and reduce the bloating. If you had a lower endoscopy (such as a colonoscopy or flexible sigmoidoscopy) you may notice spotting of blood in your stool or on the toilet paper. If you underwent a bowel prep for your procedure, you may not have a normal bowel movement for a few days.  Please Note:  You might notice some irritation and congestion in your nose or some drainage.  This is from the oxygen used during your procedure.  There is no need for concern and it should clear up in a day or so.  SYMPTOMS TO REPORT IMMEDIATELY:  Following lower endoscopy (colonoscopy or flexible sigmoidoscopy):  Excessive amounts of blood in the stool  Significant tenderness or worsening of abdominal pains  Swelling of the abdomen that is new, acute  Fever of 100F or higher  For urgent or emergent issues, a gastroenterologist can be reached at any hour by calling [phone]. Do not use MyChart messaging for urgent concerns.    DIET:  We do recommend a small meal at first, but then you may proceed to your regular diet.  Drink plenty of fluids but you should avoid alcoholic beverages for 24 hours.  ACTIVITY:  You should plan to take it easy for the rest of today and you should NOT DRIVE or use heavy machinery until tomorrow (because of  the sedation medicines used during the test).    FOLLOW UP: Our staff will call the number listed on your records the next business day following your procedure.  We will call around 7:15- 8:00 am to check on you and address any questions or concerns that you may have regarding the information given to you following your procedure. If we do not reach you, we will leave a message.     If any biopsies were taken you will be contacted by phone or by letter within the next 1-3 weeks.  Please call us at [phone] if you have not heard about the biopsies in 3 weeks.    SIGNATURES/CONFIDENTIALITY: You and/or your care partner have signed paperwork which will be entered into your electronic medical record.  These signatures attest to the fact that that the information above on your After Visit Summary has been reviewed and is understood.  Full responsibility of the confidentiality of this discharge information lies with you and/or your care-partner.

## 2022-05-29 NOTE — Progress Notes (Signed)
[facility] History and Physical   Primary Care Physician:  Unk Pinto, MD   Reason for Procedure:  History of adenomatous colon polyps  Plan:    Surveillance colonoscopy with possible interventions as needed     HPI: Jacqueline Banks is a very pleasant 56 y.o. female here for surveillance colonoscopy. Denies any nausea, vomiting, abdominal pain, melena or bright red blood per rectum  The risks and benefits as well as alternatives of endoscopic procedure(s) have been discussed and reviewed. All questions answered. The patient agrees to proceed.    Past Medical History:  Diagnosis Date   Depression    pt denies    Eczema    Vitamin D deficiency     Past Surgical History:  Procedure Laterality Date   chin tuck  03/2022   COLONOSCOPY  2007   normal   COLONOSCOPY WITH PROPOFOL  11/14/2020   Dr.Aleida Crandell   LAPAROSCOPIC APPENDECTOMY N/A 02/20/2021   Procedure: APPENDECTOMY LAPAROSCOPIC;  Surgeon: Leighton Ruff, MD;  Location: [facility];  Service: General;  Laterality: N/A;   LASIK  2002   WISDOM TOOTH EXTRACTION      Prior to Admission medications   Medication Sig Start Date End Date Taking? Authorizing Provider  BIOTIN PO Take 1 capsule by mouth daily.   Yes [provider]  buPROPion (WELLBUTRIN XL) 300 MG 24 hr tablet Take 1 tablet (300 mg total) by mouth every morning. 01/29/22 01/29/23 Yes Charlcie Cradle, MD  cholecalciferol (VITAMIN D3) 25 MCG (1000 UNIT) tablet Take 1,000 Units by mouth daily.   Yes [provider]  vitamin B-12 (CYANOCOBALAMIN) 1000 MCG tablet Take 3,000 mcg by mouth daily.   Yes [provider]  vortioxetine HBr (TRINTELLIX) 20 MG TABS tablet Take 1 tablet (20 mg total) by mouth daily. 01/29/22   Charlcie Cradle, MD    Current Outpatient Medications  Medication Sig Dispense Refill   BIOTIN PO Take 1 capsule by mouth daily.     buPROPion (WELLBUTRIN XL) 300 MG 24 hr tablet Take 1 tablet (300 mg total) by  mouth every morning. 90 tablet 0   cholecalciferol (VITAMIN D3) 25 MCG (1000 UNIT) tablet Take 1,000 Units by mouth daily.     vitamin B-12 (CYANOCOBALAMIN) 1000 MCG tablet Take 3,000 mcg by mouth daily.     vortioxetine HBr (TRINTELLIX) 20 MG TABS tablet Take 1 tablet (20 mg total) by mouth daily. 90 tablet 0   Current Facility-Administered Medications  Medication Dose Route Frequency Provider Last Rate Last Admin   0.9 %  sodium chloride infusion  500 mL Intravenous Once Mauri Pole, MD        Allergies as of 05/29/2022   (No Known Allergies)    Family History  Problem Relation Age of Onset   Bipolar disorder Sister    Anxiety disorder Brother    Melanoma Mother 105   Diabetes Father    Obesity Father    Colon polyps Father 34   Colon cancer Neg Hx    Esophageal cancer Neg Hx    Stomach cancer Neg Hx    Rectal cancer Neg Hx     Social History   Socioeconomic History   Marital status: Married    Spouse name: Not on file   Number of children: 4   Years of education: 16   Highest education level: Bachelor's degree (e.g., BA, AB, BS)  Occupational History   Not on file  Tobacco Use   Smoking status: Never  Smokeless tobacco: Never  Vaping Use   Vaping Use: Never used  Substance and Sexual Activity   Alcohol use: Not Currently    Comment: last 09/14/2021   Drug use: Never   Sexual activity: Yes    Partners: Male    Birth control/protection: Post-menopausal  Other Topics Concern   Not on file  Social History Narrative      Current living in [place] with husand and 2 kids   Born and raised in [place] by both parents   Siblings 6 total- pt is 3rd child- 4 boys and 2 girls   Married 71yr      Legal issues- denies   Social Determinants of HRadio broadcast assistantStrain: Not on file  Food Insecurity: Not on file  Transportation Needs: Not on file  Physical Activity: Not on file  Stress: Not on file  Social Connections: Not on file  Intimate Partner  Violence: Not on file    Review of Systems:  All other review of systems negative except as mentioned in the HPI.  Physical Exam: Vital signs in last 24 hours: BP 106/71   Pulse 91   Temp 97.8 F (36.6 C) (Temporal)   Ht 5' 8"  (1.727 m)   Wt 155 lb (70.3 kg)   SpO2 96%   BMI 23.57 kg/m  General:   Alert, NAD Lungs:  Clear .   Heart:  Regular rate and rhythm Abdomen:  Soft, nontender and nondistended. Neuro/Psych:  Alert and cooperative. Normal mood and affect. A and O x 3  Reviewed labs, radiology imaging, old records and pertinent past GI work up  Patient is appropriate for planned procedure(s) and anesthesia in an ambulatory setting   K. VDenzil Magnuson, MD [phone]

## 2022-05-29 NOTE — Op Note (Signed)
[place] Patient Name: Jacqueline Banks Procedure Date: 05/29/2022 1:34 PM MRN: [id number] Endoscopist: Mauri Pole , MD Age: 56 Referring MD:  Date of Birth: July 02, 1966 Gender: Female Account #: [id number] Procedure:                Colonoscopy Indications:              High risk colon cancer surveillance: Personal                            history of colonic polyps, Surveillance: History of                            piecemeal removal adenoma on last colonoscopy (< 3                            yrs), appendiceal orifice SSA s/p resection with                            involvement of cecal cuff 2022 Medicines:                Monitored Anesthesia Care Procedure:                Pre-Anesthesia Assessment:                           - Prior to the procedure, a History and Physical                            was performed, and patient medications and                            allergies were reviewed. The patient's tolerance of                            previous anesthesia was also reviewed. The risks                            and benefits of the procedure and the sedation                            options and risks were discussed with the patient.                            All questions were answered, and informed consent                            was obtained. Prior Anticoagulants: The patient has                            taken no previous anticoagulant or antiplatelet                            agents. ASA Grade Assessment: II - A patient with  mild systemic disease. After reviewing the risks                            and benefits, the patient was deemed in                            satisfactory condition to undergo the procedure.                           After obtaining informed consent, the colonoscope                            was passed under direct vision. Throughout the                            procedure, the patient's  blood pressure, pulse, and                            oxygen saturations were monitored continuously. The                            Olympus PCF-H190DL ([id number]) Colonoscope was                            introduced through the anus and advanced to the the                            cecum, identified by appendiceal orifice and                            ileocecal valve. The colonoscopy was performed                            without difficulty. The patient tolerated the                            procedure well. The quality of the bowel                            preparation was adequate to identify polyps 6 mm                            and larger in size. The terminal ileum, ileocecal                            valve, appendiceal orifice, and rectum were                            photographed. Scope In: 1:37:48 PM Scope Out: 2:00:39 PM Scope Withdrawal Time: 0 hours 9 minutes 51 seconds  Total Procedure Duration: 0 hours 22 minutes 51 seconds  Findings:                 The perianal and digital rectal examinations were  normal.                           A localized area of mildly nodular mucosa was found                            in the cecum. Biopsies were taken with a cold                            forceps for histology.                           A few scattered non-bleeding erosions were found in                            the entire colon.                           Non-bleeding external and internal hemorrhoids were                            found during retroflexion. The hemorrhoids were                            medium-sized. Complications:            No immediate complications. Estimated Blood Loss:     Estimated blood loss was minimal. Impression:               - Nodular mucosa in the cecum. Biopsied.                           - A few erosions in the entire examined colon.                           - Non-bleeding external and internal  hemorrhoids. Recommendation:           - Patient has a contact number available for                            emergencies. The signs and symptoms of potential                            delayed complications were discussed with the                            patient. Return to normal activities tomorrow.                            Written discharge instructions were provided to the                            patient.                           - Resume previous diet.                           -  Continue present medications.                           - Await pathology results.                           - Repeat colonoscopy in 3 years for surveillance                            based on pathology results. Mauri Pole, MD 05/29/2022 2:14:33 PM This report has been signed electronically.

## 2022-05-29 NOTE — Progress Notes (Signed)
Called to room to assist during endoscopic procedure.  Patient ID and intended procedure confirmed with present staff. Received instructions for my participation in the procedure from the performing physician.  

## 2022-06-01 ENCOUNTER — Telehealth: Payer: Self-pay

## 2022-06-01 NOTE — Telephone Encounter (Signed)
  Follow up Call-     05/29/2022   12:49 PM 11/14/2020    9:16 AM  Call back number  Post procedure Call Back phone  # [phone]  Permission to leave phone message Yes Yes     Patient questions:  Do you have a fever, pain , or abdominal swelling? No. Pain Score  0 *  Have you tolerated food without any problems? Yes.    Have you been able to return to your normal activities? Yes.    Do you have any questions about your discharge instructions: Diet   No. Medications  No. Follow up visit  No.  Do you have questions or concerns about your Care? No.  Actions: * If pain score is 4 or above: No action needed, pain <4.

## 2022-06-18 ENCOUNTER — Telehealth (HOSPITAL_BASED_OUTPATIENT_CLINIC_OR_DEPARTMENT_OTHER): Payer: BC Managed Care – PPO | Admitting: Psychiatry

## 2022-06-18 ENCOUNTER — Encounter: Payer: Self-pay | Admitting: Gastroenterology

## 2022-06-18 DIAGNOSIS — F1010 Alcohol abuse, uncomplicated: Secondary | ICD-10-CM

## 2022-06-18 DIAGNOSIS — F331 Major depressive disorder, recurrent, moderate: Secondary | ICD-10-CM | POA: Diagnosis not present

## 2022-06-18 MED ORDER — VORTIOXETINE HBR 20 MG PO TABS: 20.0000 mg | ORAL_TABLET | Freq: Every day | ORAL | 0 refills | Status: DC

## 2022-06-18 MED ORDER — BUPROPION HCL ER (XL) 300 MG PO TB24: 300.0000 mg | ORAL_TABLET | ORAL | 0 refills | Status: DC

## 2022-06-18 MED ORDER — DISULFIRAM 250 MG PO TABS: 250.0000 mg | ORAL_TABLET | Freq: Every day | ORAL | 0 refills | Status: DC

## 2022-06-18 NOTE — Progress Notes (Signed)
Virtual Visit via Video Note  I connected with Jacqueline Banks on 06/18/22 at  1:30 PM EDT by  a video enabled telemedicine application and verified that I am speaking with the correct person using two identifiers.  Location: Patient: Home Provider: office   I discussed the limitations of evaluation and management by telemedicine and the availability of in person appointments. The patient expressed understanding and agreed to proceed.  History of Present Illness: Jacqueline Banks shares that she was still drinking wine every night until the middle of September. She stopped taking Trintellix around that time because she felt it was a "heavy drug". Jacqueline Banks thought she was doing well but in 3 days 3 different people told her she was acting strange. She noticed she wasn't driving as carefully as she usually does. Her husband told her she was talking a mile a minute and her boss told her that she seemed unfocused. On September 30th she started taking Antabuse and Trintellix again. It has been 6 days but she feeling better. She feels like she is the right place again. She has not hand any alcohol in 6 days and is happy. Jacqueline Banks denies withdrawal symptoms and denies cravings. Her sleep and appetite are good. She denies depression and anhedonia. She denies SI/HI. Jacqueline Banks is committing to 3 months of Antabuse use.    Observations/Objective: Psychiatric Specialty Exam: ROS  There were no vitals taken for this visit.There is no height or weight on file to calculate BMI.  General Appearance: Neat and Well Groomed  Eye Contact:  Good  Speech:  Clear and Coherent and Normal Rate  Volume:  Normal  Mood:  Euthymic  Affect:  Full Range  Thought Process:  Goal Directed, Linear, and Descriptions of Associations: Intact  Orientation:  Full (Time, Place, and Person)  Thought Content:  Logical  Suicidal Thoughts:  No  Homicidal Thoughts:  No  Memory:  Immediate;   Good  Judgement:  Good  Insight:  Good  Psychomotor  Activity:  Normal  Concentration:  Concentration: Good  Recall:  Good  Fund of Knowledge:  Good  Language:  Good  Akathisia:  No  Handed:  Right  AIMS (if indicated):     Assets:  Communication Skills Desire for Improvement Financial Resources/Insurance Housing Intimacy Leisure Time [place] Talents/Skills Transportation Vocational/Educational  ADL's:  Intact  Cognition:  WNL  Sleep:        Assessment and Plan:     06/18/2022    1:39 PM 04/30/2022   11:48 AM 01/29/2022    4:09 PM 01/29/2022    4:07 PM 12/11/2021    8:41 AM  Depression screen PHQ 2/9  Decreased Interest 0 0 0 0 1  Down, Depressed, Hopeless 0 1 0  1  PHQ - 2 Score 0 1 0 0 2  Altered sleeping   0 0 1  Tired, decreased energy   3 2 3   Change in appetite   0  0  Feeling bad or failure about yourself    0  3  Trouble concentrating   0  2  Moving slowly or fidgety/restless   0  0  Suicidal thoughts   0  0  PHQ-9 Score   3 2 11   Difficult doing work/chores   Not difficult at all  Very difficult    Flowsheet Row Video Visit from 06/18/2022 in [facility] Video Visit from 01/29/2022 in [facility] Video Visit from 12/11/2021  in [facility]  C-SSRS RISK CATEGORY No Risk Error: Question 6 not populated No Risk         Pt is aware that these meds carry a teratogenic risk. Pt will discuss plan of action if she does or plans to become pregnant in the future.  Status of current problems: mood has improved and is motivated to continue Antabuse  Meds: restart Trintellix and Antabuse  1. MDD (major depressive disorder), recurrent episode, moderate (HCC) - buPROPion (WELLBUTRIN XL) 300 MG 24 hr tablet; Take 1 tablet (300 mg total) by mouth every morning.  Dispense: 90 tablet; Refill: 0 - vortioxetine HBr (TRINTELLIX) 20 MG TABS tablet; Take 1 tablet (20 mg total)  by mouth daily.  Dispense: 90 tablet; Refill: 0  2. Alcohol use disorder, mild, abuse - disulfiram (ANTABUSE) 250 MG tablet; Take 1 tablet (250 mg total) by mouth daily.  Dispense: 90 tablet; Refill: 0     Labs: none    Therapy: brief supportive therapy provided. Discussed psychosocial stressors in detail.     Collaboration of Care: Other none  Patient/Guardian was advised Release of Information must be obtained prior to any record release in order to collaborate their care with an outside provider. Patient/Guardian was advised if they have not already done so to contact the registration department to sign all necessary forms in order for [place] to release information regarding their care.   Consent: Patient/Guardian gives verbal consent for treatment and assignment of benefits for services provided during this visit. Patient/Guardian expressed understanding and agreed to proceed.      Follow Up Instructions: Follow up in 2-3 month or sooner if needed    I discussed the assessment and treatment plan with the patient. The patient was provided an opportunity to ask questions and all were answered. The patient agreed with the plan and demonstrated an understanding of the instructions.   The patient was advised to call back or seek an in-person evaluation if the symptoms worsen or if the condition fails to improve as anticipated.  I provided 16 minutes of non-face-to-face time during this encounter.   Charlcie Cradle, MD

## 2022-07-16 ENCOUNTER — Institutional Professional Consult (permissible substitution): Payer: BC Managed Care – PPO | Admitting: Plastic Surgery

## 2022-08-20 ENCOUNTER — Telehealth (HOSPITAL_COMMUNITY): Payer: BC Managed Care – PPO | Admitting: Psychiatry

## 2022-09-02 DIAGNOSIS — H2513 Age-related nuclear cataract, bilateral: Secondary | ICD-10-CM | POA: Diagnosis not present

## 2022-09-02 DIAGNOSIS — H04123 Dry eye syndrome of bilateral lacrimal glands: Secondary | ICD-10-CM | POA: Diagnosis not present

## 2022-09-02 DIAGNOSIS — Z9889 Other specified postprocedural states: Secondary | ICD-10-CM | POA: Diagnosis not present

## 2022-09-02 DIAGNOSIS — H40013 Open angle with borderline findings, low risk, bilateral: Secondary | ICD-10-CM | POA: Diagnosis not present

## 2022-09-03 ENCOUNTER — Telehealth (HOSPITAL_COMMUNITY): Payer: BC Managed Care – PPO | Admitting: Psychiatry

## 2022-09-24 ENCOUNTER — Telehealth (HOSPITAL_BASED_OUTPATIENT_CLINIC_OR_DEPARTMENT_OTHER): Payer: BC Managed Care – PPO | Admitting: Psychiatry

## 2022-09-24 DIAGNOSIS — F1010 Alcohol abuse, uncomplicated: Secondary | ICD-10-CM | POA: Diagnosis not present

## 2022-09-24 DIAGNOSIS — F331 Major depressive disorder, recurrent, moderate: Secondary | ICD-10-CM

## 2022-09-24 MED ORDER — DISULFIRAM 250 MG PO TABS: 250.0000 mg | ORAL_TABLET | Freq: Every day | ORAL | 0 refills | Status: DC

## 2022-09-24 MED ORDER — VORTIOXETINE HBR 20 MG PO TABS: 20.0000 mg | ORAL_TABLET | Freq: Every day | ORAL | 0 refills | Status: DC

## 2022-09-24 MED ORDER — BUPROPION HCL ER (XL) 300 MG PO TB24: 300.0000 mg | ORAL_TABLET | ORAL | 0 refills | Status: DC

## 2022-09-24 NOTE — Progress Notes (Signed)
Virtual Visit via Video Note  I connected with Jacqueline Banks on 09/24/22 at  4:15 PM EST by  a video enabled telemedicine application and verified that I am speaking with the correct person using two identifiers.  Location: Patient: driving a car. I advised her to pull over. Provider: office   I discussed the limitations of evaluation and management by telemedicine and the availability of in person appointments. The patient expressed understanding and agreed to proceed.  History of Present Illness: "I need more time to get good. I have been really bad". Jacqueline Banks is disappointed in herself and is sick of herself. Over the holidays she did not take Antabuse. She drank alcohol and is now feeling ashamed. She drank with family over Thanksgiving and around Christmas. Her father died over the holidays. It was hard but being with her family helped. She felt supported in her grief. Now she is grieving but it feels appropriate and she does not have any unresolved issues with him. She is now drinking 3/4 bottle of wine every night. Today she took 1/4 tab of Antabuse. Everyday she makes plans to be productive and by the end of the day she hasn't done most of it. [name] thinks she is has unresolved depressed. "I drag myself thru life". She worries that she is doing something wrong all the time and that her kids will know about her drinking. Jacqueline Banks is ready to admit that she has a problem but doesn't know what to do to get better. [name] denies SI/HI.   Observations/Objective: Psychiatric Specialty Exam: ROS  There were no vitals taken for this visit.There is no height or weight on file to calculate BMI.  General Appearance: Casual  Eye Contact:  Good  Speech:  Clear and Coherent and Normal Rate  Volume:  Normal  Mood:  Depressed  Affect:  Full Range  Thought Process:  Coherent and Descriptions of Associations: Circumstantial  Orientation:  Full (Time, Place, and Person)  Thought Content:  Rumination   Suicidal Thoughts:  No  Homicidal Thoughts:  No  Memory:  Immediate;   Good  Judgement:  Good  Insight:  Good  Psychomotor Activity:  Normal  Concentration:  Concentration: Good  Recall:  Good  Fund of Knowledge:  Good  Language:  Good  Akathisia:  No  Handed:  Right  AIMS (if indicated):     Assets:  Communication Skills Desire for Improvement Financial Resources/Insurance Housing Intimacy Resilience Talents/Skills Transportation Vocational/Educational  ADL's:  Intact  Cognition:  WNL  Sleep:        Assessment and Plan:     06/18/2022    1:39 PM 04/30/2022   11:48 AM 01/29/2022    4:09 PM 01/29/2022    4:07 PM 12/11/2021    8:41 AM  Depression screen PHQ 2/9  Decreased Interest 0 0 0 0 1  Down, Depressed, Hopeless 0 1 0  1  PHQ - 2 Score 0 1 0 0 2  Altered sleeping   0 0 1  Tired, decreased energy   '3 2 3  '$ Change in appetite   0  0  Feeling bad or failure about yourself    0  3  Trouble concentrating   0  2  Moving slowly or fidgety/restless   0  0  Suicidal thoughts   0  0  PHQ-9 Score   '3 2 11  '$ Difficult doing work/chores   Not difficult at all  Very difficult    Flowsheet Row Video  Visit from 06/18/2022 in [facility] Video Visit from 01/29/2022 in [facility] Video Visit from 12/11/2021 in [facility] No Risk Error: Question 6 not populated No Risk          Pt is aware that these meds carry a teratogenic risk. Pt will discuss plan of action if she does or plans to become pregnant in the future.  Status of current problems: ongoing alcohol use, worsening depression due to alcohol use.   Medication management with supportive therapy. Risks and benefits, side effects and alternative treatment options discussed with patient. Pt was given an opportunity to ask questions about medication, illness, and treatment. All  current psychiatric medications have been reviewed and discussed with the patient and adjusted as clinically appropriate.  Pt verbalized understanding and verbal consent obtained for treatment.  Meds: restart Antabuse- talked about the need for an accountability partner  Talked about joining a substance abuse support group [facility] is something she is considering joining but is unsure. I will have my office send her a list of local substance abuse support groups.   1. MDD (major depressive disorder), recurrent episode, moderate (HCC) - buPROPion (WELLBUTRIN XL) 300 MG 24 hr tablet; Take 1 tablet (300 mg total) by mouth every morning.  Dispense: 90 tablet; Refill: 0 - vortioxetine HBr (TRINTELLIX) 20 MG TABS tablet; Take 1 tablet (20 mg total) by mouth daily.  Dispense: 90 tablet; Refill: 0  2. Alcohol use disorder, mild, abuse - disulfiram (ANTABUSE) 250 MG tablet; Take 1 tablet (250 mg total) by mouth daily.  Dispense: 90 tablet; Refill: 0   Labs: none    Therapy: brief supportive therapy provided. Discussed psychosocial stressors in detail.   -Discussed the importance of self care and reviewed ways to engage in self care -Recommended pt stop all drug and alcohol use   Collaboration of Care: Patient refused AEB substance abuse therapy referral   Patient/Guardian was advised Release of Information must be obtained prior to any record release in order to collaborate their care with an outside provider. Patient/Guardian was advised if they have not already done so to contact the registration department to sign all necessary forms in order for [place] to release information regarding their care.   Consent: Patient/Guardian gives verbal consent for treatment and assignment of benefits for services provided during this visit. Patient/Guardian expressed understanding and agreed to proceed.      Follow Up Instructions: Follow up in 1 month or sooner if needed    I discussed the  assessment and treatment plan with the patient. The patient was provided an opportunity to ask questions and all were answered. The patient agreed with the plan and demonstrated an understanding of the instructions.   The patient was advised to call back or seek an in-person evaluation if the symptoms worsen or if the condition fails to improve as anticipated.  I provided 22 minutes of non-face-to-face time during this encounter.   Charlcie Cradle, MD

## 2022-09-28 ENCOUNTER — Other Ambulatory Visit (HOSPITAL_COMMUNITY): Payer: Self-pay | Admitting: Obstetrics & Gynecology

## 2022-09-28 ENCOUNTER — Telehealth (HOSPITAL_COMMUNITY): Payer: Self-pay | Admitting: Psychiatry

## 2022-09-28 DIAGNOSIS — E2839 Other primary ovarian failure: Secondary | ICD-10-CM

## 2022-09-28 NOTE — Telephone Encounter (Signed)
09/28/22 Mailed a list of Alcohol Use Support Groups in [place] per provider request../sh

## 2022-09-30 DIAGNOSIS — R399 Unspecified symptoms and signs involving the genitourinary system: Secondary | ICD-10-CM | POA: Diagnosis not present

## 2022-09-30 DIAGNOSIS — R3915 Urgency of urination: Secondary | ICD-10-CM | POA: Diagnosis not present

## 2022-10-21 ENCOUNTER — Encounter: Payer: BC Managed Care – PPO | Admitting: Adult Health

## 2022-10-21 DIAGNOSIS — Z1231 Encounter for screening mammogram for malignant neoplasm of breast: Secondary | ICD-10-CM | POA: Diagnosis not present

## 2022-10-21 DIAGNOSIS — Z01411 Encounter for gynecological examination (general) (routine) with abnormal findings: Secondary | ICD-10-CM | POA: Diagnosis not present

## 2022-10-21 DIAGNOSIS — M545 Low back pain, unspecified: Secondary | ICD-10-CM | POA: Diagnosis not present

## 2022-10-21 DIAGNOSIS — Z6823 Body mass index (BMI) 23.0-23.9, adult: Secondary | ICD-10-CM | POA: Diagnosis not present

## 2022-10-21 DIAGNOSIS — L439 Lichen planus, unspecified: Secondary | ICD-10-CM | POA: Diagnosis not present

## 2022-10-21 DIAGNOSIS — N952 Postmenopausal atrophic vaginitis: Secondary | ICD-10-CM | POA: Diagnosis not present

## 2022-10-21 LAB — HM MAMMOGRAPHY

## 2022-10-22 ENCOUNTER — Ambulatory Visit: Payer: BC Managed Care – PPO | Admitting: Internal Medicine

## 2022-10-22 ENCOUNTER — Telehealth (HOSPITAL_COMMUNITY): Payer: BC Managed Care – PPO | Admitting: Psychiatry

## 2022-10-22 ENCOUNTER — Encounter: Payer: Self-pay | Admitting: Internal Medicine

## 2022-10-22 VITALS — BP 100/68 | HR 84 | Temp 98.0°F | Resp 16 | Ht 67.5 in | Wt 154.0 lb

## 2022-10-22 DIAGNOSIS — Z131 Encounter for screening for diabetes mellitus: Secondary | ICD-10-CM | POA: Diagnosis not present

## 2022-10-22 DIAGNOSIS — E60 Dietary zinc deficiency: Secondary | ICD-10-CM | POA: Diagnosis not present

## 2022-10-22 DIAGNOSIS — Z13220 Encounter for screening for lipoid disorders: Secondary | ICD-10-CM

## 2022-10-22 DIAGNOSIS — Z130 Encounter for screening for diseases of the blood and blood-forming organs and certain disorders involving the immune mechanism: Secondary | ICD-10-CM | POA: Diagnosis not present

## 2022-10-22 DIAGNOSIS — Z1389 Encounter for screening for other disorder: Secondary | ICD-10-CM | POA: Diagnosis not present

## 2022-10-22 DIAGNOSIS — Z136 Encounter for screening for cardiovascular disorders: Secondary | ICD-10-CM

## 2022-10-22 DIAGNOSIS — E559 Vitamin D deficiency, unspecified: Secondary | ICD-10-CM | POA: Diagnosis not present

## 2022-10-22 DIAGNOSIS — I700 Atherosclerosis of aorta: Secondary | ICD-10-CM

## 2022-10-22 DIAGNOSIS — Z111 Encounter for screening for respiratory tuberculosis: Secondary | ICD-10-CM

## 2022-10-22 DIAGNOSIS — Z79899 Other long term (current) drug therapy: Secondary | ICD-10-CM | POA: Diagnosis not present

## 2022-10-22 DIAGNOSIS — E782 Mixed hyperlipidemia: Secondary | ICD-10-CM

## 2022-10-22 DIAGNOSIS — R5383 Other fatigue: Secondary | ICD-10-CM

## 2022-10-22 DIAGNOSIS — Z0000 Encounter for general adult medical examination without abnormal findings: Secondary | ICD-10-CM

## 2022-10-22 DIAGNOSIS — R7309 Other abnormal glucose: Secondary | ICD-10-CM

## 2022-10-22 DIAGNOSIS — R030 Elevated blood-pressure reading, without diagnosis of hypertension: Secondary | ICD-10-CM

## 2022-10-22 DIAGNOSIS — Z1211 Encounter for screening for malignant neoplasm of colon: Secondary | ICD-10-CM

## 2022-10-22 DIAGNOSIS — Z0001 Encounter for general adult medical examination with abnormal findings: Secondary | ICD-10-CM

## 2022-10-22 DIAGNOSIS — Z8249 Family history of ischemic heart disease and other diseases of the circulatory system: Secondary | ICD-10-CM

## 2022-10-22 DIAGNOSIS — E538 Deficiency of other specified B group vitamins: Secondary | ICD-10-CM

## 2022-10-22 NOTE — Progress Notes (Unsigned)
Future Appointments  Date Time Provider Department  10/22/2022  8:45 AM Charlcie Cradle, MD [facility]  10/22/2022                                [facility]  2:00 PM Unk Pinto, MD [facility]  10/27/2023                              [facility]  2:00 PM Unk Pinto, MD [facility]    Annual Screening/Preventative Visit & Comprehensive Evaluation &  Examination      This very nice 57 y.o. MWF presents for a Screening /Preventative Visit & comprehensive evalIDuation and management of multiple medical co-morbidities.  Patient has been followed for labile HTN, HLD, Prediabetes  and Vitamin D Deficiency.        Patient is followed expectantly for labile elevated BP.  Patient's BP has been controlled at home and patient denies any cardiac symptoms as chest pain, palpitations, shortness of breath, dizziness or ankle swelling. Today's BP is at goal - 100/68.        Patient's hyperlipidemia is not controlled with diet. Last lipids were not at goal:  Lab Results  Component Value Date   CHOL 229 (H) 10/21/2021   HDL 89 10/21/2021   LDLCALC 126 (H) 10/21/2021   TRIG 58 10/21/2021   CHOLHDL 2.6 10/21/2021        Patient is monitored for glucose intolerance  and patient denies reactive hypoglycemic symptoms, visual blurring, diabetic polys or paresthesias. Last A1c was normal & at goal:  Lab Results  Component Value Date   HGBA1C 5.4 10/21/2021        Finally, patient has history of Vitamin D Deficiency ("39" / 2014 & "7" / 2016)   and last Vitamin D was low:  Lab Results  Component Value Date   VD25OH 86 10/21/2021     Current Outpatient Medications on File Prior to Visit  Medication Sig   BIOTIN PO Take 1 capsule by mouth daily.   buPROPion XL 300 MG 24 hr  Take 1 tablet  every morning.   VITAMIN D  1000 u Take 1,000 Units  daily.   vitamin B-12  1000 MCG tablet Take 3,000 mcg \daily.   vortioxetine  (TRINTELLIX) 20 MG   Take 1 tablet  daily.     No Known Allergies      Health Maintenance  Topic Date Due   COVID-19 Vaccine (1) Never done   Zoster Vaccines- Shingrix (1 of 2) Never done   INFLUENZA VACCINE  04/14/2022   MAMMOGRAM  09/19/2022   PAP SMEAR-Modifier  09/28/2024   DTaP/Tdap/Td (3 - Td or Tdap) 10/21/2030   Hepatitis C Screening  Completed   HIV Screening  Completed   HPV VACCINES  Aged Out     Immunization History  Administered Date(s) Administered   Influenza Inj Mdck Quad With Preservative 07/13/2017, 08/04/2018   Td 10/21/2020   Tdap 09/14/2010    Colon -  12 yrs ago - age 16 (Dx Crohn's - patient denies sx's and declined treatment)   Last Colon  05/29/2022 - Dr Genia Plants -> Recc 3 yearf/u  due Sept 2026   Last MGM - 09/19/2021    Past Surgical History:  Procedure Laterality Date   chin tuck  03/2022   COLONOSCOPY  2007   normal   COLONOSCOPY WITH PROPOFOL  11/14/2020   Dr.Nandigam   LAPAROSCOPIC APPENDECTOMY N/A 02/20/2021   Procedure: APPENDECTOMY LAPAROSCOPIC;  Surgeon: Leighton Ruff, MD;  Location: [facility];  Service: General;  Laterality: N/A;   LASIK  2002   WISDOM TOOTH EXTRACTION       Social History   Tobacco Use   Smoking status: Never   Smokeless tobacco: Never  Vaping Use   Vaping Use: Never used  Substance Use Topics   Alcohol use: Not Currently    Comment: last 09/14/2021   Drug use: Never      ROS Constitutional: Denies fever, chills, weight loss/gain, headaches, insomnia,  night sweats, and change in appetite. Does c/o fatigue. Eyes: Denies redness, blurred vision, diplopia, discharge, itchy, watery eyes.  ENT: Denies discharge, congestion, post nasal drip, epistaxis, sore throat, earache, hearing loss, dental pain, Tinnitus, Vertigo, Sinus pain, snoring.  Cardio: Denies chest pain, palpitations, irregular heartbeat, syncope, dyspnea, diaphoresis, orthopnea, PND, claudication, edema Respiratory: denies cough, dyspnea, DOE, pleurisy, hoarseness, laryngitis, wheezing.  Gastrointestinal:  Denies dysphagia, heartburn, reflux, water brash, pain, cramps, nausea, vomiting, bloating, diarrhea, constipation, hematemesis, melena, hematochezia, jaundice, hemorrhoids Genitourinary: Denies dysuria, frequency, urgency, nocturia, hesitancy, discharge, hematuria, flank pain Breast: Breast lumps, nipple discharge, bleeding.  Musculoskeletal: Denies arthralgia, myalgia, stiffness, Jt. Swelling, pain, limp, and strain/sprain. Denies falls. Skin: Denies puritis, rash, hives, warts, acne, eczema, changing in skin lesion Neuro: No weakness, tremor, incoordination, spasms, paresthesia, pain Psychiatric: Denies confusion, memory loss, sensory loss. Denies Depression. Endocrine: Denies change in weight, skin, hair change, nocturia, and paresthesia, diabetic polys, visual blurring, hyper / hypo glycemic episodes.  Heme/Lymph: No excessive bleeding, bruising, enlarged lymph nodes.  Physical Exam  BP 100/68   Pulse 84   Temp 98 F (36.7 C)   Resp 16   Ht 5' 7.5" (1.715 m)   Wt 154 lb (69.9 kg)   SpO2 96%   BMI 23.76 kg/m   General Appearance: Well nourished, well groomed and in no apparent distress.  Eyes: PERRLA, EOMs, conjunctiva no swelling or erythema, normal fundi and vessels. Sinuses: No frontal/maxillary tenderness ENT/Mouth: EACs patent / TMs  nl. Nares clear without erythema, swelling, mucoid exudates. Oral hygiene is good. No erythema, swelling, or exudate. Tongue normal, non-obstructing. Tonsils not swollen or erythematous. Hearing normal.  Neck: Supple, thyroid not palpable. No bruits, nodes or JVD. Respiratory: Respiratory effort normal.  BS equal and clear bilateral without rales, rhonci, wheezing or stridor. Cardio: Heart sounds are normal with regular rate and rhythm and no murmurs, rubs or gallops. Peripheral pulses are normal and equal bilaterally without edema. No aortic or femoral bruits. Chest: symmetric with normal excursions and percussion. Breasts: Deferred to recent  Negative MGM Abdomen: Flat, soft with bowel sounds active. Nontender, no guarding, rebound, hernias, masses, or organomegaly.  Lymphatics: Non tender without lymphadenopathy.  Musculoskeletal: Full ROM all peripheral extremities, joint stability, 5/5 strength, and normal gait. Skin: Warm and dry without rashes, lesions, cyanosis, clubbing or  ecchymosis.  Neuro: Cranial nerves intact, reflexes equal bilaterally. Normal muscle tone, no cerebellar symptoms. Sensation intact.  Pysch: Alert and oriented X 3, normal affect, Insight and Judgment appropriate.   Assessment and Plan  1. Annual Preventative Screening Examination   2. Elevated BP without diagnosis of hypertension  - EKG 12-Lead - [place], retroperitnl abd,  ltd - Urinalysis, Routine w reflex microscopic - Microalbumin / Creatinine Urine Ratio - CBC with Diff - COMPLETE METABOLIC PANEL WITH GFR - Magnesium - TSH  3. Screening cholesterol level  - EKG 12-Lead -  [place], retroperitnl abd,  ltd - Lipid Profile - TSH  4. Abnormal glucose  - EKG 12-Lead - [place], retroperitnl abd,  ltd - Hemoglobin A1c ([facility]) - Insulin, random  5. Vitamin D deficiency  - Vitamin D (25 hydroxyl)  6. Screening for colorectal cancer  - [facility]ard  7. Screening for ischemic heart disease  - EKG 12-Lead  8. Screening for AAA (aortic abdominal aneurysm)  - [place], retroperitnl abd,  ltd  9. Fatigue  - Iron, Total/Total Iron Binding Cap - Vitamin B12 - CBC with Diff - TSH  10. Medication management  - Urinalysis, Routine w reflex microscopic - Microalbumin / Creatinine Urine Ratio - CBC with Diff - COMPLETE METABOLIC PANEL WITH GFR - Magnesium - Lipid Profile - TSH - Hemoglobin A1c ([facility]) - Insulin, random - Vitamin D (25 hydroxy)         Patient was counseled in prudent diet to achieve/maintain BMI less than 25 for weight control, BP monitoring, regular exercise and medications. Discussed med's effects and SE's. Screening labs  and tests as requested with regular follow-up as recommended. Over 40 minutes of exam, counseling, chart review and high complex critical decision making was performed.   Kirtland Bouchard, MD

## 2022-10-22 NOTE — Patient Instructions (Addendum)
Due to recent changes in healthcare laws, you may see the results of your imaging and laboratory studies on MyChart before your provider has had a chance to review them.  We understand that in some cases there may be results that are confusing or concerning to you. Not all laboratory results come back in the same time frame and the provider may be waiting for multiple results in order to interpret others.  Please give [place] 48 hours in order for your provider to thoroughly review all the results before contacting the office for clarification of your results.  +++++++++++++++++++++++++++++++++++++++++++++++++++++++++++++ P  L  E AS E      S T O P      B  I  O  T  I  N ++++++++++++++++++++++++++++++++++++++++++++++++++++++++++++  Vit D  & Vit C 1,000 mg   are recommended to help protect  against the Covid-19 and other Corona viruses.    Also it's recommended  to take  Zinc 50 mg x 1/2 tab = 25 mg  /day  to help  protect against the Covid-19   and best place to get  is also on [email or web address]  and don't pay more than 6-8 cents /pill !  ================================ Coronavirus (COVID-19) Are you at risk?  Are you at risk for the Coronavirus (COVID-19)?  To be considered HIGH RISK for Coronavirus (COVID-19), you have to meet the following criteria:  Traveled to [place], [place], [place], [place] or [place]; or in the [place] to [place], [place], [place]  or [place]; and have fever, cough, and shortness of breath within the last 2 weeks of travel OR Been in close contact with a person diagnosed with COVID-19 within the last 2 weeks and have  fever, cough,and shortness of breath  IF YOU DO NOT MEET THESE CRITERIA, YOU ARE CONSIDERED LOW RISK FOR COVID-19.  What to do if you are HIGH RISK for COVID-19?  If you are having a medical emergency, call 911. Seek medical care right away. Before you go to a doctor's office, urgent care or emergency department,  call ahead and tell them  about your recent travel, contact with someone diagnosed with COVID-19   and your symptoms.  You should receive instructions from your physician's office regarding next steps of care.  When you arrive at healthcare provider, tell the healthcare staff immediately you have returned from  visiting [place], [place], [place], [place] or [place]; or traveled in the [place] to [place], [place],  [place] or [place] in the last two weeks or you have been in close contact with a person diagnosed with  COVID-19 in the last 2 weeks.   Tell the health care staff about your symptoms: fever, cough and shortness of breath. After you have been seen by a medical provider, you will be either: Tested for (COVID-19) and discharged home on quarantine except to seek medical care if  symptoms worsen, and asked to  Stay home and avoid contact with others until you get your results (4-5 days)  Avoid travel on public transportation if possible (such as bus, train, or airplane) or Sent to the Emergency Department by EMS for evaluation, COVID-19 testing  and  possible admission depending on your condition and test results.  What to do if you are LOW RISK for COVID-19?  Reduce your risk of any infection by using the same precautions used for avoiding the common cold or flu:  Wash your hands often with soap  and warm water for at least 20 seconds.  If soap and water are not readily available,  use an alcohol-based hand sanitizer with at least 60% alcohol.  If coughing or sneezing, cover your mouth and nose by coughing or sneezing into the elbow areas of your shirt or coat,  into a tissue or into your sleeve (not your hands). Avoid shaking hands with others and consider head nods or verbal greetings only. Avoid touching your eyes, nose, or mouth with unwashed hands.  Avoid close contact with people who are sick. Avoid places or events with large numbers of people in one location, like concerts or sporting  events. Carefully consider travel plans you have or are making. If you are planning any travel outside or inside the [place], visit the CDC's Travelers' Health webpage for the latest health notices. If you have some symptoms but not all symptoms, continue to monitor at home and seek medical attention  if your symptoms worsen. If you are having a medical emergency, call 911. >>>>>>>>>>>>>>>>>>>>>>> Preventive Care for Adults  A healthy lifestyle and preventive care can promote health and wellness. Preventive health guidelines for women include the following key practices. A routine yearly physical is a good way to check with your health care provider about your health and preventive screening. It is a chance to share any concerns and updates on your health and to receive a thorough exam. Visit your dentist for a routine exam and preventive care every 6 months. Brush your teeth twice a day and floss once a day. Good oral hygiene prevents tooth decay and gum disease. The frequency of eye exams is based on your age, health, family medical history, use of contact lenses, and other factors. Follow your health care provider's recommendations for frequency of eye exams. Eat a healthy diet. Foods like vegetables, fruits, whole grains, low-fat dairy products, and lean protein foods contain the nutrients you need without too many calories. Decrease your intake of foods high in solid fats, added sugars, and salt. Eat the right amount of calories for you. Get information about a proper diet from your health care provider, if necessary. Regular physical exercise is one of the most important things you can do for your health. Most adults should get at least 150 minutes of moderate-intensity exercise (any activity that increases your heart rate and causes you to sweat) each week. In addition, most adults need muscle-strengthening exercises on 2 or more days a week. Maintain a healthy weight. The body mass index (BMI) is a  screening tool to identify possible weight problems. It provides an estimate of body fat based on height and weight. Your health care provider can find your BMI and can help you achieve or maintain a healthy weight. For adults 20 years and older: A BMI below 18.5 is considered underweight. A BMI of 18.5 to 24.9 is normal. A BMI of 25 to 29.9 is considered overweight. A BMI of 30 and above is considered obese. Maintain normal blood lipids and cholesterol levels by exercising and minimizing your intake of saturated fat. Eat a balanced diet with plenty of fruit and vegetables. Blood tests for lipids and cholesterol should begin at age 93 and be repeated every 5 years. If your lipid or cholesterol levels are high, you are over 50, or you are at high risk for heart disease, you may need your cholesterol levels checked more frequently. Ongoing high lipid and cholesterol levels should be treated with medicines if diet and exercise are not  working. If you smoke, find out from your health care provider how to quit. If you do not use tobacco, do not start. Lung cancer screening is recommended for adults aged 30-80 years who are at high risk for developing lung cancer because of a history of smoking. A yearly low-dose CT scan of the lungs is recommended for people who have at least a 30-pack-year history of smoking and are a current smoker or have quit within the past 15 years. A pack year of smoking is smoking an average of 1 pack of cigarettes a day for 1 year (for example: 1 pack a day for 30 years or 2 packs a day for 15 years). Yearly screening should continue until the smoker has stopped smoking for at least 15 years. Yearly screening should be stopped for people who develop a health problem that would prevent them from having lung cancer treatment. High blood pressure causes heart disease and increases the risk of stroke. Your blood pressure should be checked at least every 1 to 2 years. Ongoing high blood  pressure should be treated with medicines if weight loss and exercise do not work. If you are 15-38 years old, ask your health care provider if you should take aspirin to prevent strokes. Diabetes screening involves taking a blood sample to check your fasting blood sugar level. This should be done once every 3 years, after age 65, if you are within normal weight and without risk factors for diabetes. Testing should be considered at a younger age or be carried out more frequently if you are overweight and have at least 1 risk factor for diabetes. Breast cancer screening is essential preventive care for women. You should practice "breast self-awareness." This means understanding the normal appearance and feel of your breasts and may include breast self-examination. Any changes detected, no matter how small, should be reported to a health care provider. Women in their 68s and 30s should have a clinical breast exam (CBE) by a health care provider as part of a regular health exam every 1 to 3 years. After age 31, women should have a CBE every year. Starting at age 74, women should consider having a mammogram (breast X-ray test) every year. Women who have a family history of breast cancer should talk to their health care provider about genetic screening. Women at a high risk of breast cancer should talk to their health care providers about having an MRI and a mammogram every year. Breast cancer gene (BRCA)-related cancer risk assessment is recommended for women who have family members with BRCA-related cancers. BRCA-related cancers include breast, ovarian, tubal, and peritoneal cancers. Having family members with these cancers may be associated with an increased risk for harmful changes (mutations) in the breast cancer genes BRCA1 and BRCA2. Results of the assessment will determine the need for genetic counseling and BRCA1 and BRCA2 testing. Routine pelvic exams to screen for cancer are no longer recommended for  nonpregnant women who are considered low risk for cancer of the pelvic organs (ovaries, uterus, and vagina) and who do not have symptoms. Ask your health care provider if a screening pelvic exam is right for you. If you have had past treatment for cervical cancer or a condition that could lead to cancer, you need Pap tests and screening for cancer for at least 20 years after your treatment. If Pap tests have been discontinued, your risk factors (such as having a new sexual partner) need to be reassessed to determine if screening should be  resumed. Some women have medical problems that increase the chance of getting cervical cancer. In these cases, your health care provider may recommend more frequent screening and Pap tests. Colorectal cancer can be detected and often prevented. Most routine colorectal cancer screening begins at the age of 96 years and continues through age 33 years. However, your health care provider may recommend screening at an earlier age if you have risk factors for colon cancer. On a yearly basis, your health care provider may provide home test kits to check for hidden blood in the stool. Use of a small camera at the end of a tube, to directly examine the colon (sigmoidoscopy or colonoscopy), can detect the earliest forms of colorectal cancer. Talk to your health care provider about this at age 17, when routine screening begins.  Direct exam of the colon should be repeated every 5-10 years through age 6 years, unless early forms of pre-cancerous polyps or small growths are found. Hepatitis C blood testing is recommended for all people born from [birth year] through 1965 and any individual with known risks for hepatitis C.  Osteoporosis is a disease in which the bones lose minerals and strength with aging. This can result in serious bone fractures or breaks. The risk of osteoporosis can be identified using a bone density scan. Women ages 40 years and over and women at risk for fractures or  osteoporosis should discuss screening with their health care providers. Ask your health care provider whether you should take a calcium supplement or vitamin D to reduce the rate of osteoporosis. Menopause can be associated with physical symptoms and risks. Hormone replacement therapy is available to decrease symptoms and risks. You should talk to your health care provider about whether hormone replacement therapy is right for you. Use sunscreen. Apply sunscreen liberally and repeatedly throughout the day. You should seek shade when your shadow is shorter than you. Protect yourself by wearing long sleeves, pants, a wide-brimmed hat, and sunglasses year round, whenever you are outdoors. Once a month, do a whole body skin exam, using a mirror to look at the skin on your back. Tell your health care provider of new moles, moles that have irregular borders, moles that are larger than a pencil eraser, or moles that have changed in shape or color. Stay current with required vaccines (immunizations). Influenza vaccine. All adults should be immunized every year. Tetanus, diphtheria, and acellular pertussis (Td, Tdap) vaccine. Pregnant women should receive 1 dose of Tdap vaccine during each pregnancy. The dose should be obtained regardless of the length of time since the last dose. Immunization is preferred during the 27th-36th week of gestation. An adult who has not previously received Tdap or who does not know her vaccine status should receive 1 dose of Tdap. This initial dose should be followed by tetanus and diphtheria toxoids (Td) booster doses every 10 years. Adults with an unknown or incomplete history of completing a 3-dose immunization series with Td-containing vaccines should begin or complete a primary immunization series including a Tdap dose. Adults should receive a Td booster every 10 years. Varicella vaccine. An adult without evidence of immunity to varicella should receive 2 doses or a second dose if  she has previously received 1 dose. Pregnant females who do not have evidence of immunity should receive the first dose after pregnancy. This first dose should be obtained before leaving the health care facility. The second dose should be obtained 4-8 weeks after the first dose. Human papillomavirus (HPV) vaccine. Females aged  13-26 years who have not received the vaccine previously should obtain the 3-dose series. The vaccine is not recommended for use in pregnant females. However, pregnancy testing is not needed before receiving a dose. If a female is found to be pregnant after receiving a dose, no treatment is needed. In that case, the remaining doses should be delayed until after the pregnancy. Immunization is recommended for any person with an immunocompromised condition through the age of 6 years if she did not get any or all doses earlier. During the 3-dose series, the second dose should be obtained 4-8 weeks after the first dose. The third dose should be obtained 24 weeks after the first dose and 16 weeks after the second dose. Zoster vaccine. One dose is recommended for adults aged 70 years or older unless certain conditions are present. Measles, mumps, and rubella (MMR) vaccine. Adults born before [birth year] generally are considered immune to measles and mumps. Adults born in [birth year] or later should have 1 or more doses of MMR vaccine unless there is a contraindication to the vaccine or there is laboratory evidence of immunity to each of the three diseases. A routine second dose of MMR vaccine should be obtained at least 28 days after the first dose for students attending postsecondary schools, health care workers, or international travelers. People who received inactivated measles vaccine or an unknown type of measles vaccine during 1963-1967 should receive 2 doses of MMR vaccine. People who received inactivated mumps vaccine or an unknown type of mumps vaccine before 1979 and are at high risk for mumps  infection should consider immunization with 2 doses of MMR vaccine. For females of childbearing age, rubella immunity should be determined. If there is no evidence of immunity, females who are not pregnant should be vaccinated. If there is no evidence of immunity, females who are pregnant should delay immunization until after pregnancy. Unvaccinated health care workers born before [birth year] who lack laboratory evidence of measles, mumps, or rubella immunity or laboratory confirmation of disease should consider measles and mumps immunization with 2 doses of MMR vaccine or rubella immunization with 1 dose of MMR vaccine. Pneumococcal 13-valent conjugate (PCV13) vaccine. When indicated, a person who is uncertain of her immunization history and has no record of immunization should receive the PCV13 vaccine. An adult aged 60 years or older who has certain medical conditions and has not been previously immunized should receive 1 dose of PCV13 vaccine. This PCV13 should be followed with a dose of pneumococcal polysaccharide (PPSV23) vaccine. The PPSV23 vaccine dose should be obtained at least 1 or more year(s) after the dose of PCV13 vaccine. An adult aged 29 years or older who has certain medical conditions and previously received 1 or more doses of PPSV23 vaccine should receive 1 dose of PCV13. The PCV13 vaccine dose should be obtained 1 or more years after the last PPSV23 vaccine dose.  Pneumococcal polysaccharide (PPSV23) vaccine. When PCV13 is also indicated, PCV13 should be obtained first. All adults aged 79 years and older should be immunized. An adult younger than age 39 years who has certain medical conditions should be immunized. Any person who resides in a nursing home or long-term care facility should be immunized. An adult smoker should be immunized. People with an immunocompromised condition and certain other conditions should receive both PCV13 and PPSV23 vaccines. People with human immunodeficiency virus  (HIV) infection should be immunized as soon as possible after diagnosis. Immunization during chemotherapy or radiation therapy should be avoided. Routine use of  PPSV23 vaccine is not recommended for American Indians, [place] Natives, or people younger than 86 years unless there are medical conditions that require PPSV23 vaccine. When indicated, people who have unknown immunization and have no record of immunization should receive PPSV23 vaccine. One-time revaccination 5 years after the first dose of PPSV23 is recommended for people aged 19-64 years who have chronic kidney failure, nephrotic syndrome, asplenia, or immunocompromised conditions. People who received 1-2 doses of PPSV23 before age 33 years should receive another dose of PPSV23 vaccine at age 62 years or later if at least 5 years have passed since the previous dose. Doses of PPSV23 are not needed for people immunized with PPSV23 at or after age 71 years.  Preventive Services / Frequency  Ages 78 to 68 years Blood pressure check. Lipid and cholesterol check. Lung cancer screening. / Every year if you are aged 75-80 years and have a 30-pack-year history of smoking and currently smoke or have quit within the past 15 years. Yearly screening is stopped once you have quit smoking for at least 15 years or develop a health problem that would prevent you from having lung cancer treatment. Clinical breast exam.** / Every year after age 60 years.  BRCA-related cancer risk assessment.** / For women who have family members with a BRCA-related cancer (breast, ovarian, tubal, or peritoneal cancers). Mammogram.** / Every year beginning at age 36 years and continuing for as long as you are in good health. Consult with your health care provider. Pap test.** / Every 3 years starting at age 48 years through age 7 or 33 years with a history of 3 consecutive normal Pap tests. HPV screening.** / Every 3 years from ages 32 years through ages 67 to 60 years with a  history of 3 consecutive normal Pap tests. Fecal occult blood test (FOBT) of stool. / Every year beginning at age 72 years and continuing until age 80 years. You may not need to do this test if you get a colonoscopy every 10 years. Flexible sigmoidoscopy or colonoscopy.** / Every 5 years for a flexible sigmoidoscopy or every 10 years for a colonoscopy beginning at age 53 years and continuing until age 56 years. Hepatitis C blood test.** / For all people born from [birth year] through 1965 and any individual with known risks for hepatitis C. Skin self-exam. / Monthly. Influenza vaccine. / Every year. Tetanus, diphtheria, and acellular pertussis (Tdap/Td) vaccine.** / Consult your health care provider. Pregnant women should receive 1 dose of Tdap vaccine during each pregnancy. 1 dose of Td every 10 years. Varicella vaccine.** / Consult your health care provider. Pregnant females who do not have evidence of immunity should receive the first dose after pregnancy. Zoster vaccine.** / 1 dose for adults aged 58 years or older. Pneumococcal 13-valent conjugate (PCV13) vaccine.** / Consult your health care provider. Pneumococcal polysaccharide (PPSV23) vaccine.** / 1 to 2 doses if you smoke cigarettes or if you have certain conditions. Meningococcal vaccine.** / Consult your health care provider. Hepatitis A vaccine.** / Consult your health care provider. Hepatitis B vaccine.** / Consult your health care provider. Screening for abdominal aortic aneurysm (AAA)  by ultrasound is recommended for people over 50 who have history of high blood pressure or who are current or former smokers. ++++++++++++++++++ Recommend Adult Low Dose Aspirin or  coated  Aspirin 81 mg daily  To reduce risk of Colon Cancer 40 %,  Skin Cancer 26 % ,  Melanoma 46%  and  Pancreatic cancer 60% +++++++++++++++++++ Vitamin D  goal  is between 70-100.  Please make sure that you are taking your Vitamin D as directed.  It is very important  as a natural anti-inflammatory  helping hair, skin, and nails, as well as reducing stroke and heart attack risk.  It helps your bones and helps with mood. It also decreases numerous cancer risks so please take it as directed.  Low Vit D is associated with a 200-300% higher risk for CANCER  and 200-300% higher risk for HEART   ATTACK  &  STROKE.   .....................................Marland Kitchen It is also associated with higher death rate at younger ages,  autoimmune diseases like Rheumatoid arthritis, Lupus, Multiple Sclerosis.    Also many other serious conditions, like depression, Alzheimer's Dementia, infertility, muscle aches, fatigue, fibromyalgia - just to name a few. ++++++++++++++++++ Recommend the book "The END of DIETING" by Dr Excell Seltzer  & the book "The END of DIABETES " by Dr Excell Seltzer At [email or web address] - get book & Audio CD's    Being diabetic has a  300% increased risk for heart attack, stroke, cancer, and alzheimer- type vascular dementia. It is very important that you work harder with diet by avoiding all foods that are white. Avoid white rice (brown & wild rice is OK), white potatoes (sweetpotatoes in moderation is OK), White bread or wheat bread or anything made out of white flour like bagels, donuts, rolls, buns, biscuits, cakes, pastries, cookies, pizza crust, and pasta (made from white flour & egg whites) - vegetarian pasta or spinach or wheat pasta is OK. Multigrain breads like Arnold's or [employer], or multigrain sandwich thins or flatbreads.  Diet, exercise and weight loss can reverse and cure diabetes in the early stages.  Diet, exercise and weight loss is very important in the control and prevention of complications of diabetes which affects every system in your body, ie. Brain - dementia/stroke, eyes - glaucoma/blindness, heart - heart attack/heart failure, kidneys - dialysis, stomach - gastric paralysis, intestines - malabsorption, nerves - severe painful neuritis,  circulation - gangrene & loss of a leg(s), and finally cancer and Alzheimers.    I recommend avoid fried & greasy foods,  sweets/candy, white rice (brown or wild rice or Quinoa is OK), white potatoes (sweet potatoes are OK) - anything made from white flour - bagels, doughnuts, rolls, buns, biscuits,white and wheat breads, pizza crust and traditional pasta made of white flour & egg white(vegetarian pasta or spinach or wheat pasta is OK).  Multi-grain bread is OK - like multi-grain flat bread or sandwich thins. Avoid alcohol in excess. Exercise is also important.    Eat all the vegetables you want - avoid meat, especially red meat and dairy - especially cheese.  Cheese is the most concentrated form of trans-fats which is the worst thing to clog up our arteries. Veggie cheese is OK which can be found in the fresh produce section at [employer] or [employer] or [employer]  ++++++++++++++++++++++ DASH Eating Plan  DASH stands for "Dietary Approaches to Stop Hypertension."   The DASH eating plan is a healthy eating plan that has been shown to reduce high blood pressure (hypertension). Additional health benefits may include reducing the risk of type 2 diabetes mellitus, heart disease, and stroke. The DASH eating plan may also help with weight loss. WHAT DO I NEED TO KNOW ABOUT THE DASH EATING PLAN? For the DASH eating plan, you will follow these general guidelines: Choose foods with a percent daily value for sodium of less than 5% (  as listed on the food label). Use salt-free seasonings or herbs instead of table salt or sea salt. Check with your health care provider or pharmacist before using salt substitutes. Eat lower-sodium products, often labeled as "lower sodium" or "no salt added." Eat fresh foods. Eat more vegetables, fruits, and low-fat dairy products. Choose whole grains. Look for the word "whole" as the first word in the ingredient list. Choose fish  Limit sweets, desserts, sugars, and  sugary drinks. Choose heart-healthy fats. Eat veggie cheese  Eat more home-cooked food and less restaurant, buffet, and fast food. Limit fried foods. Cook foods using methods other than frying. Limit canned vegetables. If you do use them, rinse them well to decrease the sodium. When eating at a restaurant, ask that your food be prepared with less salt, or no salt if possible.                      WHAT FOODS CAN I EAT? Read Dr Fara Olden Fuhrman's books on The End of Dieting & The End of Diabetes  Grains Whole grain or whole wheat bread. Brown rice. Whole grain or whole wheat pasta. Quinoa, bulgur, and whole grain cereals. Low-sodium cereals. Corn or whole wheat flour tortillas. Whole grain cornbread. Whole grain crackers. Low-sodium crackers.  Vegetables Fresh or frozen vegetables (raw, steamed, roasted, or grilled). Low-sodium or reduced-sodium tomato and vegetable juices. Low-sodium or reduced-sodium tomato sauce and paste. Low-sodium or reduced-sodium canned vegetables.   Fruits All fresh, canned (in natural juice), or frozen fruits.  Protein Products  All fish and seafood.  Dried beans, peas, or lentils. Unsalted nuts and seeds. Unsalted canned beans.  Dairy Low-fat dairy products, such as skim or 1% milk, 2% or reduced-fat cheeses, low-fat ricotta or cottage cheese, or plain low-fat yogurt. Low-sodium or reduced-sodium cheeses.  Fats and Oils Tub margarines without trans fats. Light or reduced-fat mayonnaise and salad dressings (reduced sodium). Avocado. Safflower, olive, or canola oils. Natural peanut or almond butter.  Other Unsalted popcorn and pretzels. The items listed above may not be a complete list of recommended foods or beverages. Contact your dietitian for more options.  ++++++++++++++++++  WHAT FOODS ARE NOT RECOMMENDED? Grains/ White flour or wheat flour White bread. White pasta. White rice. Refined cornbread. Bagels and croissants. Crackers that contain trans  fat.  Vegetables  Creamed or fried vegetables. Vegetables in a . Regular canned vegetables. Regular canned tomato sauce and paste. Regular tomato and vegetable juices.  Fruits Dried fruits. Canned fruit in light or heavy syrup. Fruit juice.  Meat and Other Protein Products Meat in general - RED meat & White meat.  Fatty cuts of meat. Ribs, chicken wings, all processed meats as bacon, sausage, bologna, salami, fatback, hot dogs, bratwurst and packaged luncheon meats.  Dairy Whole or 2% milk, cream, half-and-half, and cream cheese. Whole-fat or sweetened yogurt. Full-fat cheeses or blue cheese. Non-dairy creamers and whipped toppings. Processed cheese, cheese spreads, or cheese curds.  Condiments Onion and garlic salt, seasoned salt, table salt, and sea salt. Canned and packaged gravies. Worcestershire sauce. Tartar sauce. Barbecue sauce. Teriyaki sauce. Soy sauce, including reduced sodium. Steak sauce. Fish sauce. Oyster sauce. Cocktail sauce. Horseradish. Ketchup and mustard. Meat flavorings and tenderizers. Bouillon cubes. Hot sauce. Tabasco sauce. Marinades. Taco seasonings. Relishes.  Fats and Oils Butter, stick margarine, lard, shortening and bacon fat. Coconut, palm kernel, or palm oils. Regular salad dressings.  Pickles and olives. Salted popcorn and pretzels.  The items listed above may not  be a complete list of foods and beverages to avoid.

## 2022-10-23 LAB — COMPLETE METABOLIC PANEL WITH GFR
AG Ratio: 1.7 (calc) (ref 1.0–2.5)
ALT: 14 U/L (ref 6–29)
AST: 15 U/L (ref 10–35)
Albumin: 4.5 g/dL (ref 3.6–5.1)
Alkaline phosphatase (APISO): 66 U/L (ref 37–153)
BUN: 15 mg/dL (ref 7–25)
CO2: 28 mmol/L (ref 20–32)
Calcium: 10 mg/dL (ref 8.6–10.4)
Chloride: 101 mmol/L (ref 98–110)
Creat: 0.78 mg/dL (ref 0.50–1.03)
Globulin: 2.7 g/dL (calc) (ref 1.9–3.7)
Glucose, Bld: 89 mg/dL (ref 65–99)
Potassium: 4.4 mmol/L (ref 3.5–5.3)
Sodium: 140 mmol/L (ref 135–146)
Total Bilirubin: 0.4 mg/dL (ref 0.2–1.2)
Total Protein: 7.2 g/dL (ref 6.1–8.1)
eGFR: 89 mL/min/{1.73_m2} (ref >=60)

## 2022-10-23 LAB — URINALYSIS, ROUTINE W REFLEX MICROSCOPIC
Bacteria, UA: NONE SEEN /HPF
Bilirubin Urine: NEGATIVE
Glucose, UA: NEGATIVE
Hyaline Cast: NONE SEEN /LPF
Ketones, ur: NEGATIVE
Nitrite: NEGATIVE
Protein, ur: NEGATIVE
Specific Gravity, Urine: 1.019 (ref 1.001–1.035)
Squamous Epithelial / HPF: NONE SEEN /HPF (ref <=5)
WBC, UA: >=60 /HPF — AB (ref 0–5)
pH: 6.5 (ref 5.0–8.0)

## 2022-10-23 LAB — INSULIN, RANDOM: Insulin: 2.6 u[IU]/mL

## 2022-10-23 LAB — CBC WITH DIFFERENTIAL/PLATELET
Absolute Monocytes: 487 cells/uL (ref 200–950)
Basophils Absolute: 101 cells/uL (ref 0–200)
Basophils Relative: 1.8 %
Eosinophils Absolute: 202 cells/uL (ref 15–500)
Eosinophils Relative: 3.6 %
HCT: 38.3 % (ref 35.0–45.0)
Hemoglobin: 13.3 g/dL (ref 11.7–15.5)
Lymphs Abs: 2246 cells/uL (ref 850–3900)
MCH: 31.1 pg (ref 27.0–33.0)
MCHC: 34.7 g/dL (ref 32.0–36.0)
MCV: 89.7 fL (ref 80.0–100.0)
MPV: 11.1 fL (ref 7.5–12.5)
Monocytes Relative: 8.7 %
Neutro Abs: 2565 cells/uL (ref 1500–7800)
Neutrophils Relative %: 45.8 %
Platelets: 219 10*3/uL (ref 140–400)
RBC: 4.27 10*6/uL (ref 3.80–5.10)
RDW: 11.4 % (ref 11.0–15.0)
Total Lymphocyte: 40.1 %
WBC: 5.6 10*3/uL (ref 3.8–10.8)

## 2022-10-23 LAB — IRON, TOTAL/TOTAL IRON BINDING CAP
%SAT: 36 % (calc) (ref 16–45)
Iron: 119 ug/dL (ref 45–160)
TIBC: 332 mcg/dL (calc) (ref 250–450)

## 2022-10-23 LAB — LIPID PANEL
Cholesterol: 235 mg/dL — ABNORMAL HIGH (ref <200)
HDL: 87 mg/dL (ref >=50)
LDL Cholesterol (Calc): 130 mg/dL (calc) — ABNORMAL HIGH
Non-HDL Cholesterol (Calc): 148 mg/dL (calc) — ABNORMAL HIGH (ref <130)
Total CHOL/HDL Ratio: 2.7 (calc) (ref <5.0)
Triglycerides: 84 mg/dL (ref <150)

## 2022-10-23 LAB — HEMOGLOBIN A1C
Hgb A1c MFr Bld: 5.6 % of total Hgb (ref <5.7)
Mean Plasma Glucose: 114 mg/dL
eAG (mmol/L): 6.3 mmol/L

## 2022-10-23 LAB — MICROALBUMIN / CREATININE URINE RATIO
Creatinine, Urine: 109 mg/dL (ref 20–275)
Microalb Creat Ratio: 30 mcg/mg creat — ABNORMAL HIGH (ref <30)
Microalb, Ur: 3.3 mg/dL

## 2022-10-23 LAB — MICROSCOPIC MESSAGE

## 2022-10-23 LAB — VITAMIN D 25 HYDROXY (VIT D DEFICIENCY, FRACTURES): Vit D, 25-Hydroxy: 75 ng/mL (ref 30–100)

## 2022-10-23 LAB — VITAMIN B12: Vitamin B-12: 1207 pg/mL — ABNORMAL HIGH (ref 200–1100)

## 2022-10-23 LAB — TSH: TSH: 3.88 mIU/L (ref 0.40–4.50)

## 2022-10-23 LAB — MAGNESIUM: Magnesium: 2.1 mg/dL (ref 1.5–2.5)

## 2022-10-24 ENCOUNTER — Other Ambulatory Visit: Payer: Self-pay | Admitting: Internal Medicine

## 2022-10-24 DIAGNOSIS — E782 Mixed hyperlipidemia: Secondary | ICD-10-CM

## 2022-10-24 DIAGNOSIS — N390 Urinary tract infection, site not specified: Secondary | ICD-10-CM

## 2022-10-24 MED ORDER — NITROFURANTOIN MONOHYD MACRO 100 MG PO CAPS: ORAL_CAPSULE | ORAL | 0 refills | Status: DC

## 2022-10-24 MED ORDER — ROSUVASTATIN CALCIUM 20 MG PO TABS: ORAL_TABLET | ORAL | 3 refills | Status: DC

## 2022-10-24 NOTE — Progress Notes (Signed)
<><><><><><><><><><><><><><><><><><><><><><><><><><><><><><><><><> <><><><><><><><><><><><><><><><><><><><><><><><><><><><><><><><><> - Test results slightly outside the reference range are not unusual. If there is anything important, I will review this with you,  otherwise it is considered normal test values.  If you have further questions,  please do not hesitate to contact me at the office or via My Chart.  <><><><><><><><><><><><><><><><><><><><><><><><><><><><><><><><><> <><><><><><><><><><><><><><><><><><><><><><><><><><><><><><><><><>  -  U/A  is    very suspicious for UTI - Will [facility]   <><><><><><><><><><><><><><><><><><><><><><><><><><><><><><><><><> <><><><><><><><><><><><><><><><><><><><><><><><><><><><><><><><><>  -  Vitamin B12 level is elevated , So suggest cut back to 2  x / week on Mon  &  Thurs <><><><><><><><><><><><><><><><><><><><><><><><><><><><><><><><><> <><><><><><><><><><><><><><><><><><><><><><><><><><><><><><><><><>  -   - Total Chol =235    is very high risk for Heart Attack /Stroke /Vascular Dementia                        ( Ideal or Goal is less than 180 ! )  & - Bad /Dangerous LDL Chol =   130  - - >> Sitting on a time Bomb !                          ( Ideal or Goal is less than 70 ! )   - Treating with meds to lower Cholesterol is treating the result                                          & NOT treating the cause  - The cause is Bad Diet !  - But need to go ahead & start meds until get on a better diet to try &                                                          reverse some of the Damage already done   - Read or listen to   Dr Alden Benjamin 's book    " How Not to Die ! "    - Recommend a stricter plant based low cholesterol diet   - Cholesterol only comes from animal sources                                                                                              - ie. meat, dairy, egg yolks  - Eat all the vegetables you want.  - Avoid Meat, Avoid Meat , Avoid Meat  ! ! !                                                                              -  especially red meat - Beef AND Pork  - Avoid cheese & dairy - milk & ice cream.   - Cheese is the most concentrated form of trans-fats which                                                    is the worst thing to clog up our arteries.   - Veggie cheese is OK which can be found in                                              the fresh produce section at                                                          [employer] or [employer] or [employer] <><><><><><><><><><><><><><><><><><><><><><><><><><><><><><><><><> <><><><><><><><><><><><><><><><><><><><><><><><><><><><><><><><><>  -  Iron levels  - Normal & OK  <><><><><><><><><><><><><><><><><><><><><><><><><><><><><><><><><> <><><><><><><><><><><><><><><><><><><><><><><><><><><><><><><><><>  -  A1c - Normal - No Diabetes  -   Great ! <><><><><><><><><><><><><><><><><><><><><><><><><><><><><><><><><> <><><><><><><><><><><><><><><><><><><><><><><><><><><><><><><><><>  -  Vitamin D = 75  - Excellent   - Please keep dose same  <><><><><><><><><><><><><><><><><><><><><><><><><><><><><><><><><> <><><><><><><><><><><><><><><><><><><><><><><><><><><><><><><><><>  -  All Else - CBC - Kidneys - Electrolytes - Liver - Magnesium & Thyroid    - all  Normal / OK <><><><><><><><><><><><><><><><><><><><><><><><><><><><><><><><><> <><><><><><><><><><><><><><><><><><><><><><><><><><><><><><><><><>

## 2022-10-25 ENCOUNTER — Encounter: Payer: Self-pay | Admitting: Internal Medicine

## 2022-10-29 ENCOUNTER — Telehealth (HOSPITAL_BASED_OUTPATIENT_CLINIC_OR_DEPARTMENT_OTHER): Payer: BC Managed Care – PPO | Admitting: Psychiatry

## 2022-10-29 DIAGNOSIS — F331 Major depressive disorder, recurrent, moderate: Secondary | ICD-10-CM

## 2022-10-29 DIAGNOSIS — F1010 Alcohol abuse, uncomplicated: Secondary | ICD-10-CM | POA: Diagnosis not present

## 2022-10-29 MED ORDER — VORTIOXETINE HBR 20 MG PO TABS: 20.0000 mg | ORAL_TABLET | Freq: Every day | ORAL | 0 refills | Status: DC

## 2022-10-29 MED ORDER — BUPROPION HCL ER (XL) 300 MG PO TB24: 300.0000 mg | ORAL_TABLET | ORAL | 0 refills | Status: DC

## 2022-10-29 NOTE — Progress Notes (Signed)
Virtual Visit via Video Note  I connected with Jacqueline Banks on 10/29/22 at  9:45 AM EST by  a video enabled telemedicine application and verified that I am speaking with the correct person using two identifiers.  Location: Patient: office Provider: office   I discussed the limitations of evaluation and management by telemedicine and the availability of in person appointments. The patient expressed understanding and agreed to proceed.  History of Present Illness: "I am really good".  The last time she had any alcohol was 09/24/22 until last night when she had one glass of wine for Valentines's day. She realized that that one drink triggered some anxiety and self hate. Jacqueline Banks feels like the meds are working better. Jacqueline Banks loves waking up feeling good. She is taking Antabuse- 1/4 tab about 2x/week. It takes 2 weeks for the effect to really wear off. Jacqueline Banks had her annual physical 1 week ago. She has elevated cholesterol and is wondering if meds are doing it. She is work on her diet. She denies depression and anhedonia. Jacqueline Banks denies hopelessness and isolation. She denies SI/HI.    Observations/Objective: Psychiatric Specialty Exam: ROS  There were no vitals taken for this visit.There is no height or weight on file to calculate BMI.  General Appearance: Neat and Well Groomed  Eye Contact:  Good  Speech:  Clear and Coherent and Normal Rate  Volume:  Normal  Mood:  Euthymic  Affect:  Full Range  Thought Process:  Goal Directed, Linear, and Descriptions of Associations: Intact  Orientation:  Full (Time, Place, and Person)  Thought Content:  Logical  Suicidal Thoughts:  No  Homicidal Thoughts:  No  Memory:  Immediate;   Good  Judgement:  Good  Insight:  Good  Psychomotor Activity:  Normal  Concentration:  Concentration: Good  Recall:  Good  Fund of Knowledge:  Good  Language:  Good  Akathisia:  No  Handed:  Right  AIMS (if indicated):     Assets:  Communication Skills Desire for  Improvement Financial Resources/Insurance Housing Intimacy Leisure Time Resilience Social Support Talents/Skills Transportation Vocational/Educational  ADL's:  Intact  Cognition:  WNL  Sleep:        Assessment and Plan:     10/29/2022   10:08 AM 10/25/2022   10:39 PM 06/18/2022    1:39 PM 04/30/2022   11:48 AM 01/29/2022    4:09 PM  Depression screen PHQ 2/9  Decreased Interest 0 0 0 0 0  Down, Depressed, Hopeless 0 0 0 1 0  PHQ - 2 Score 0 0 0 1 0  Altered sleeping     0  Tired, decreased energy     3  Change in appetite     0  Feeling bad or failure about yourself      0  Trouble concentrating     0  Moving slowly or fidgety/restless     0  Suicidal thoughts     0  PHQ-9 Score     3  Difficult doing work/chores     Not difficult at all    Flowsheet Row Video Visit from 10/29/2022 in [facility] Video Visit from 06/18/2022 in [facility] Video Visit from 01/29/2022 in [place] No Risk No Risk Error: Question 6 not populated          Pt is aware that these meds carry a teratogenic risk. Pt will discuss plan of action  if she does or plans to become pregnant in the future.  Status of current problems: Jacqueline Banks has quit drinking alcohol since 09/24/22. Her depression and anxiety are stable.    Medication management with supportive therapy. Risks and benefits, side effects and alternative treatment options discussed with patient. Pt was given an opportunity to ask questions about medication, illness, and treatment. All current psychiatric medications have been reviewed and discussed with the patient and adjusted as clinically appropriate.  Pt verbalized understanding and verbal consent obtained for treatment.  Meds: continue Antabuse for alcohol use 1. Alcohol use disorder, mild, abuse  2. MDD (major depressive disorder), recurrent  episode, moderate (HCC) - buPROPion (WELLBUTRIN XL) 300 MG 24 hr tablet; Take 1 tablet (300 mg total) by mouth every morning.  Dispense: 90 tablet; Refill: 0 - vortioxetine HBr (TRINTELLIX) 20 MG TABS tablet; Take 1 tablet (20 mg total) by mouth daily.  Dispense: 90 tablet; Refill: 0     Labs: none    Therapy: brief supportive therapy provided. Discussed psychosocial stressors in detail.      Collaboration of Care: Other n/a  Patient/Guardian was advised Release of Information must be obtained prior to any record release in order to collaborate their care with an outside provider. Patient/Guardian was advised if they have not already done so to contact the registration department to sign all necessary forms in order for [place] to release information regarding their care.   Consent: Patient/Guardian gives verbal consent for treatment and assignment of benefits for services provided during this visit. Patient/Guardian expressed understanding and agreed to proceed.    Follow Up Instructions: Follow up in 2-3 months or sooner if needed    I discussed the assessment and treatment plan with the patient. The patient was provided an opportunity to ask questions and all were answered. The patient agreed with the plan and demonstrated an understanding of the instructions.   The patient was advised to call back or seek an in-person evaluation if the symptoms worsen or if the condition fails to improve as anticipated.  I provided 12 minutes of non-face-to-face time during this encounter.   Charlcie Cradle, MD

## 2022-11-03 LAB — ZINC: Zinc: 62 ug/dL (ref 60–130)

## 2022-11-03 NOTE — Progress Notes (Signed)
<><><><><><><><><><><><><><><><><><><><><><><><><><><><><><><><><> <><><><><><><><><><><><><><><><><><><><><><><><><><><><><><><><><>  -   Zinc level finally returned & is at very LOW end of Normal. So  Recommend take                       Zinc 50 mg x 1/2 tab = 25 mg  Daily               Or                        take a 50 mg tablet every other day.   <><><><><><><><><><><><><><><><><><><><><><><><><><><><><><><><><> <><><><><><><><><><><><><><><><><><><><><><><><><><><><><><><><><>

## 2022-11-23 ENCOUNTER — Encounter: Payer: Self-pay | Admitting: Internal Medicine

## 2022-12-21 DIAGNOSIS — L439 Lichen planus, unspecified: Secondary | ICD-10-CM | POA: Diagnosis not present

## 2022-12-24 ENCOUNTER — Telehealth (HOSPITAL_BASED_OUTPATIENT_CLINIC_OR_DEPARTMENT_OTHER): Payer: BC Managed Care – PPO | Admitting: Psychiatry

## 2022-12-24 DIAGNOSIS — F1010 Alcohol abuse, uncomplicated: Secondary | ICD-10-CM | POA: Diagnosis not present

## 2022-12-24 DIAGNOSIS — F331 Major depressive disorder, recurrent, moderate: Secondary | ICD-10-CM

## 2022-12-24 MED ORDER — DISULFIRAM 250 MG PO TABS: 250.0000 mg | ORAL_TABLET | Freq: Every day | ORAL | 0 refills | Status: DC

## 2022-12-24 MED ORDER — VORTIOXETINE HBR 20 MG PO TABS: 20.0000 mg | ORAL_TABLET | Freq: Every day | ORAL | 0 refills | Status: DC

## 2022-12-24 NOTE — Progress Notes (Signed)
Virtual Visit via Video Note  I connected with Jacqueline Banks on 12/24/22 at 10:30 AM EDT by a video enabled telemedicine application and verified that I am speaking with the correct person using two identifiers.  Location: Patient: home Provider: office   I discussed the limitations of evaluation and management by telemedicine and the availability of in person appointments. The patient expressed understanding and agreed to proceed.  History of Present Illness: Jacqueline Banks shares she is "mostly ok". 2 weeks ago she was in [place] and forgot her medication. She was off her meds for 2 weeks. She has not restarted them yet. She is feeling pretty good despite being off her meds. Jacqueline Banks denies depressed mood, anhedonia, isolation and hopelessness. She denies SI/HI. Shaya denies anxiety but has a long histroy of ruminating thoughts. She is not sure if she wants to restart all her meds. She is eating well and making healthier food choices. Jacqueline Banks drank for 1 week while in [place]. She was drinking 3/4 of a wine bottle. Now that she is back home she is not drinking alcohol. She has not restarted Antabuse. 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    Observations/Objective: Psychiatric Specialty Exam: ROS  There were no vitals taken for this visit.There is no height or weight on file to calculate BMI.  General Appearance: Fairly Groomed and Neat  Eye Contact:  Good  Speech:  Clear and Coherent and Normal Rate  Volume:  Normal  Mood:  Euthymic  Affect:  Full Range  Thought Process:  Goal Directed, Linear, and Descriptions of Associations: Intact  Orientation:  Full (Time, Place, and Person)  Thought Content:  Logical  Suicidal Thoughts:  No  Homicidal Thoughts:  No  Memory:  Immediate;   Good  Judgement:  Good  Insight:  Good  Psychomotor Activity:  Normal  Concentration:  Concentration: Good  Recall:  Good  Fund of Knowledge:  Good  Language:  Good  Akathisia:  No  Handed:  Right   AIMS (if indicated):     Assets:  Communication Skills Desire for Improvement Financial Resources/Insurance Housing Intimacy Leisure Time Resilience Social Support Talents/Skills Transportation Vocational/Educational  ADL's:  Intact  Cognition:  WNL  Sleep:        Assessment and Plan:     10/29/2022   10:08 AM 10/25/2022   10:39 PM 06/18/2022    1:39 PM 04/30/2022   11:48 AM 01/29/2022    4:09 PM  Depression screen PHQ 2/9  Decreased Interest 0 0 0 0 0  Down, Depressed, Hopeless 0 0 0 1 0  PHQ - 2 Score 0 0 0 1 0  Altered sleeping     0  Tired, decreased energy     3  Change in appetite     0  Feeling bad or failure about yourself      0  Trouble concentrating     0  Moving slowly or fidgety/restless     0  Suicidal thoughts     0  PHQ-9 Score     3  Difficult doing work/chores     Not difficult at all    Flowsheet Row Video Visit [facility] 10/29/2022 in [facility] Video Visit from 06/18/2022 in [facility] Video Visit from 01/29/2022 in [facility]  C-SSRS RISK CATEGORY No Risk No Risk Error: Question 6 not populated          Pt is  aware that these meds carry a teratogenic risk. Pt will discuss plan of action if she does or plans to become pregnant in the future.  Status of current problems: stable   Medication management with supportive therapy. Risks and benefits, side effects and alternative treatment options discussed with patient. Pt was given an opportunity to ask questions about medication, illness, and treatment. All current psychiatric medications have been reviewed and discussed with the patient and adjusted as clinically appropriate.  Pt verbalized understanding and verbal consent obtained for treatment.  Meds: restart Trintellix Restart Anabuse D/C Wellbutrin 1. MDD (major depressive disorder), recurrent episode, moderate - vortioxetine  HBr (TRINTELLIX) 20 MG TABS tablet; Take 1 tablet (20 mg total) by mouth daily.  Dispense: 90 tablet; Refill: 0  2. Alcohol use disorder, mild, abuse - disulfiram (ANTABUSE) 250 MG tablet; Take 1 tablet (250 mg total) by mouth daily.  Dispense: 90 tablet; Refill: 0     Labs: none    Therapy: brief supportive therapy provided. Discussed psychosocial stressors in detail.    Recommended pt stop all drug and alcohol use   Collaboration of Care: Other none  Patient/Guardian was advised Release of Information must be obtained prior to any record release in order to collaborate their care with an outside provider. Patient/Guardian was advised if they have not already done so to contact the registration department to sign all necessary forms in order for [place] to release information regarding their care.   Consent: Patient/Guardian gives verbal consent for treatment and assignment of benefits for services provided during this visit. Patient/Guardian expressed understanding and agreed to proceed.      Follow Up Instructions: Follow up in 1 month or sooner if needed    I discussed the assessment and treatment plan with the patient. The patient was provided an opportunity to ask questions and all were answered. The patient agreed with the plan and demonstrated an understanding of the instructions.   The patient was advised to call back or seek an in-person evaluation if the symptoms worsen or if the condition fails to improve as anticipated.  I provided 12 minutes of non-face-to-face time during this encounter.   Oletta Darter, MD

## 2023-01-18 ENCOUNTER — Encounter (HOSPITAL_COMMUNITY): Payer: Self-pay

## 2023-01-26 NOTE — Progress Notes (Signed)
FOLLOW UP  Assessment and Plan:  Recurrent major depressive disorder in full remission(HCC) Off all medications and is doing well Continues to follow with psychiatry  Hyperlipidemia Currently at goal;  Continue low cholesterol diet and exercise. Took rosuvastatin 20 mg for 1 month and stopped - Lipid - CBC - CMP  Abnormal Glucose Continue diet and exercise.   Alcohol abuse Off antabuse but will drink occasionally on the weekends Continues to follow with psychiatrist  BMI 23 Continue diet high is fresh fruits and vegetables, low saturated fats and simple carbs Continue exercise  Vitamin D Def At goal at last visit; continue supplementation to maintain goal of 60-100 -Vit D level  Abnormal Urine Routine UA with reflex microscopic Urine culture  Continue diet and meds as discussed. Further disposition pending results of labs. Discussed med's effects and SE's.   Over 30 minutes of exam, counseling, chart review, and critical decision making was performed.   Future Appointments  Date Time Provider Department Center  01/28/2023  2:30 PM Oletta Darter, MD [facility] None  05/13/2023  9:30 AM Lucky Cowboy, MD [facility] None  10/27/2023  2:00 PM Lucky Cowboy, MD [facility] None    ----------------------------------------------------------------------------------------------------------------------  HPI 57 y.o. female  presents for 3 month follow up on hypertension, cholesterol, Abnormal glucose, weight and vitamin D deficiency.   She had a bladder infection and would like recheck for cure  She has talked with her psychiatrist and is off trintellix and antabuse and is doing well and continues to follow with him.  BMI is Body mass index is 24.07 kg/m., she has been working on diet and exercise. Wt Readings from Last 3 Encounters:  01/27/23 156 lb (70.8 kg)  10/22/22 154 lb (69.9 kg)  05/29/22 155 lb (70.3 kg)    Her blood pressure has been controlled at home,  today their BP is BP: 100/68 BP Readings from Last 3 Encounters:  01/27/23 100/68  10/22/22 100/68  05/29/22 109/62   She does workout. She denies chest pain, shortness of breath, dizziness.   She was prescribed Rosuvastatin 20 mg but has not been taking. She is not eating red meat, diairy, eggs or fried foods.  She is walking daily.Her cholesterol is not at goal. The cholesterol last visit was:   Lab Results  Component Value Date   CHOL 235 (H) 10/22/2022   HDL 87 10/22/2022   LDLCALC 130 (H) 10/22/2022   TRIG 84 10/22/2022   CHOLHDL 2.7 10/22/2022    She has been working on diet and exercise for abnormal glucose. Last A1C in the office was:  Lab Results  Component Value Date   HGBA1C 5.6 10/22/2022   Patient is on Vitamin D supplement.   Lab Results  Component Value Date   VD25OH 53 10/22/2022        Current Medications:  Current Outpatient Medications on File Prior to Visit  Medication Sig   BIOTIN PO Take 1 capsule by mouth daily. (Patient not taking: Reported on 12/24/2022)   cholecalciferol (VITAMIN D3) 25 MCG (1000 UNIT) tablet Take 1,000 Units by mouth daily. (Patient not taking: Reported on 01/27/2023)   disulfiram (ANTABUSE) 250 MG tablet Take 1 tablet (250 mg total) by mouth daily. (Patient not taking: Reported on 01/27/2023)   nitrofurantoin, macrocrystal-monohydrate, (MACROBID) 100 MG capsule Take  1 capsule   2 x /day with   Food   for UTI (Patient not taking: Reported on 12/24/2022)   rosuvastatin (CRESTOR) 20 MG tablet Take 1 tablet Daily for  Cholesterol (Patient not taking: Reported on 10/29/2022)   vitamin B-12 (CYANOCOBALAMIN) 1000 MCG tablet Take 3,000 mcg by mouth daily. (Patient not taking: Reported on 12/24/2022)   vortioxetine HBr (TRINTELLIX) 20 MG TABS tablet Take 1 tablet (20 mg total) by mouth daily. (Patient not taking: Reported on 01/27/2023)   No current facility-administered medications on file prior to visit.     Allergies: No Known Allergies    Medical History:  Past Medical History:  Diagnosis Date   Depression    pt denies    Eczema    Vitamin D deficiency    Family history- Reviewed and unchanged Social history- Reviewed and unchanged   Review of Systems:  Review of Systems  Constitutional:  Negative for chills and fever.  HENT:  Negative for congestion, hearing loss, sinus pain, sore throat and tinnitus.   Eyes:  Negative for blurred vision and double vision.  Respiratory:  Negative for cough, hemoptysis, sputum production, shortness of breath and wheezing.   Cardiovascular:  Negative for chest pain, palpitations and leg swelling.  Gastrointestinal:  Negative for abdominal pain, constipation, diarrhea, heartburn, nausea and vomiting.  Genitourinary:  Negative for dysuria and urgency.  Musculoskeletal:  Negative for back pain, falls, joint pain, myalgias and neck pain.  Skin:  Negative for rash.  Neurological:  Negative for dizziness, tingling, tremors, weakness and headaches.  Endo/Heme/Allergies:  Does not bruise/bleed easily.  Psychiatric/Behavioral:  Negative for depression and suicidal ideas. The patient is not nervous/anxious and does not have insomnia.       Physical Exam: BP 100/68   Pulse 77   Temp 97.9 F (36.6 C)   Resp 16   Ht 5' 7.5" (1.715 m)   Wt 156 lb (70.8 kg)   SpO2 95%   BMI 24.07 kg/m  Wt Readings from Last 3 Encounters:  01/27/23 156 lb (70.8 kg)  10/22/22 154 lb (69.9 kg)  05/29/22 155 lb (70.3 kg)   General Appearance: Well nourished, in no apparent distress. Eyes: PERRLA, EOMs, conjunctiva no swelling or erythema Sinuses: No Frontal/maxillary tenderness ENT/Mouth: Ext aud canals clear, TMs without erythema, bulging. No erythema, swelling, or exudate on post pharynx.  Tonsils not swollen or erythematous. Hearing normal.  Neck: Supple, thyroid normal.  Respiratory: Respiratory effort normal, BS equal bilaterally without rales, rhonchi, wheezing or stridor.  Cardio: RRR with  no MRGs. Brisk peripheral pulses without edema.  Abdomen: Soft, + BS.  Non tender, no guarding, rebound, hernias, masses. Lymphatics: Non tender without lymphadenopathy.  Musculoskeletal: Full ROM, 5/5 strength, Normal gait. No CVA tenderness Skin: Warm, dry without rashes, lesions, ecchymosis.  Neuro: Cranial nerves intact. No cerebellar symptoms.  Psych: Awake and oriented X 3, normal affect, Insight and Judgment appropriate.    Raynelle Dick, NP 9:50 AM [facility]

## 2023-01-27 ENCOUNTER — Ambulatory Visit: Payer: BC Managed Care – PPO | Admitting: Nurse Practitioner

## 2023-01-27 ENCOUNTER — Encounter: Payer: Self-pay | Admitting: Nurse Practitioner

## 2023-01-27 VITALS — BP 100/68 | HR 77 | Temp 97.9°F | Resp 16 | Ht 67.5 in | Wt 156.0 lb

## 2023-01-27 DIAGNOSIS — E559 Vitamin D deficiency, unspecified: Secondary | ICD-10-CM | POA: Diagnosis not present

## 2023-01-27 DIAGNOSIS — R7309 Other abnormal glucose: Secondary | ICD-10-CM | POA: Diagnosis not present

## 2023-01-27 DIAGNOSIS — Z6823 Body mass index (BMI) 23.0-23.9, adult: Secondary | ICD-10-CM

## 2023-01-27 DIAGNOSIS — E782 Mixed hyperlipidemia: Secondary | ICD-10-CM

## 2023-01-27 DIAGNOSIS — R8290 Unspecified abnormal findings in urine: Secondary | ICD-10-CM

## 2023-01-27 DIAGNOSIS — F3342 Major depressive disorder, recurrent, in full remission: Secondary | ICD-10-CM

## 2023-01-27 DIAGNOSIS — Z79899 Other long term (current) drug therapy: Secondary | ICD-10-CM

## 2023-01-27 NOTE — Patient Instructions (Signed)

## 2023-01-28 ENCOUNTER — Other Ambulatory Visit: Payer: Self-pay | Admitting: Nurse Practitioner

## 2023-01-28 ENCOUNTER — Telehealth (HOSPITAL_COMMUNITY): Payer: BC Managed Care – PPO | Admitting: Psychiatry

## 2023-01-28 DIAGNOSIS — E782 Mixed hyperlipidemia: Secondary | ICD-10-CM

## 2023-01-28 LAB — URINALYSIS, ROUTINE W REFLEX MICROSCOPIC
Bacteria, UA: NONE SEEN /HPF
Bilirubin Urine: NEGATIVE
Glucose, UA: NEGATIVE
Hgb urine dipstick: NEGATIVE
Hyaline Cast: NONE SEEN /LPF
Ketones, ur: NEGATIVE
Nitrite: NEGATIVE
Protein, ur: NEGATIVE
RBC / HPF: NONE SEEN /HPF (ref 0–2)
Specific Gravity, Urine: 1.014 (ref 1.001–1.035)
WBC, UA: NONE SEEN /HPF (ref 0–5)
pH: 6.5 (ref 5.0–8.0)

## 2023-01-28 LAB — CBC WITH DIFFERENTIAL/PLATELET
Absolute Monocytes: 338 cells/uL (ref 200–950)
Basophils Absolute: 68 cells/uL (ref 0–200)
Basophils Relative: 1.5 %
Eosinophils Absolute: 149 cells/uL (ref 15–500)
Eosinophils Relative: 3.3 %
HCT: 40.4 % (ref 35.0–45.0)
Hemoglobin: 13.4 g/dL (ref 11.7–15.5)
Lymphs Abs: 1854 cells/uL (ref 850–3900)
MCH: 30.4 pg (ref 27.0–33.0)
MCHC: 33.2 g/dL (ref 32.0–36.0)
MCV: 91.6 fL (ref 80.0–100.0)
MPV: 10.4 fL (ref 7.5–12.5)
Monocytes Relative: 7.5 %
Neutro Abs: 2093 cells/uL (ref 1500–7800)
Neutrophils Relative %: 46.5 %
Platelets: 237 10*3/uL (ref 140–400)
RBC: 4.41 10*6/uL (ref 3.80–5.10)
RDW: 11.7 % (ref 11.0–15.0)
Total Lymphocyte: 41.2 %
WBC: 4.5 10*3/uL (ref 3.8–10.8)

## 2023-01-28 LAB — URINE CULTURE
MICRO NUMBER:: 14961074
Result:: NO GROWTH
SPECIMEN QUALITY:: ADEQUATE

## 2023-01-28 LAB — COMPLETE METABOLIC PANEL WITH GFR
AG Ratio: 1.7 (calc) (ref 1.0–2.5)
ALT: 14 U/L (ref 6–29)
AST: 17 U/L (ref 10–35)
Albumin: 4.5 g/dL (ref 3.6–5.1)
Alkaline phosphatase (APISO): 76 U/L (ref 37–153)
BUN: 15 mg/dL (ref 7–25)
CO2: 28 mmol/L (ref 20–32)
Calcium: 9.5 mg/dL (ref 8.6–10.4)
Chloride: 104 mmol/L (ref 98–110)
Creat: 0.66 mg/dL (ref 0.50–1.03)
Globulin: 2.6 g/dL (calc) (ref 1.9–3.7)
Glucose, Bld: 87 mg/dL (ref 65–99)
Potassium: 4.6 mmol/L (ref 3.5–5.3)
Sodium: 140 mmol/L (ref 135–146)
Total Bilirubin: 0.5 mg/dL (ref 0.2–1.2)
Total Protein: 7.1 g/dL (ref 6.1–8.1)
eGFR: 103 mL/min/{1.73_m2} (ref >=60)

## 2023-01-28 LAB — LIPID PANEL
Cholesterol: 215 mg/dL — ABNORMAL HIGH (ref <200)
HDL: 88 mg/dL (ref >=50)
LDL Cholesterol (Calc): 109 mg/dL (calc) — ABNORMAL HIGH
Non-HDL Cholesterol (Calc): 127 mg/dL (calc) (ref <130)
Total CHOL/HDL Ratio: 2.4 (calc) (ref <5.0)
Triglycerides: 89 mg/dL (ref <150)

## 2023-01-28 LAB — VITAMIN D 25 HYDROXY (VIT D DEFICIENCY, FRACTURES): Vit D, 25-Hydroxy: 55 ng/mL (ref 30–100)

## 2023-01-28 MED ORDER — ROSUVASTATIN CALCIUM 5 MG PO TABS
5.0000 mg | ORAL_TABLET | Freq: Every day | ORAL | 11 refills | Status: DC
Start: 2023-01-28 — End: 2023-04-26

## 2023-02-18 ENCOUNTER — Telehealth (HOSPITAL_BASED_OUTPATIENT_CLINIC_OR_DEPARTMENT_OTHER): Payer: BC Managed Care – PPO | Admitting: Psychiatry

## 2023-02-18 DIAGNOSIS — F1010 Alcohol abuse, uncomplicated: Secondary | ICD-10-CM

## 2023-02-18 DIAGNOSIS — F331 Major depressive disorder, recurrent, moderate: Secondary | ICD-10-CM | POA: Diagnosis not present

## 2023-02-18 MED ORDER — DISULFIRAM 250 MG PO TABS
250.0000 mg | ORAL_TABLET | Freq: Every day | ORAL | 0 refills | Status: DC
Start: 2023-02-18 — End: 2023-08-18

## 2023-02-18 MED ORDER — FLUOXETINE HCL 20 MG PO CAPS
20.0000 mg | ORAL_CAPSULE | Freq: Every day | ORAL | 0 refills | Status: DC
Start: 2023-02-18 — End: 2023-04-26

## 2023-02-18 NOTE — Progress Notes (Signed)
Virtual Visit via Video Note  I connected with Jacqueline Banks on 02/18/23 at  2:15 PM EDT by a video enabled telemedicine application and verified that I am speaking with the correct person using two identifiers.  Location: Patient: home Provider: office   I discussed the limitations of evaluation and management by telemedicine and the availability of in person appointments. The patient expressed understanding and agreed to proceed.  History of Present Illness: Jacqueline Banks shares she is doing well. Her kids are home for the summer and they are all getting used to it. Jacqueline Banks is thinking of going back into the office because she doesn't do well working from home. Jacqueline Banks shares she is not taking her meds for at least 6 weeks. She thought the meds were just ok but not fabulous. Jacqueline Banks thought she was doing pretty good. She was active around the house. This past week she has been drinking a lot more wine than usual. She is now drinking 2 large glasses of wine every night. She is embarrassed. Jacqueline Banks got in trouble at work yesterday and admits it was not a big deal but Jacqueline Banks felt very upset by all of it. Jacqueline Banks feels that her reaction was out of proportion to the stressor. She has been having more negative self talk and feeling sad. Jacqueline Banks is wondering if she should restart her meds. She has signed up for kickboxing classes. She denies SI/HI. Jacqueline Banks is not sure that Trintellix is right for her. She tried Wellbutrin and it made her feels strange.     Observations/Objective: Psychiatric Specialty Exam: ROS  There were no vitals taken for this visit.There is no height or weight on file to calculate BMI.  General Appearance: Fairly Groomed and Neat  Eye Contact:  Good  Speech:  Clear and Coherent and Normal Rate  Volume:  Normal  Mood:  Depressed  Affect:  Full Range  Thought Process:  Goal Directed, Linear, and Descriptions of Associations: Intact  Orientation:  Full (Time, Place, and Person)  Thought Content:   Logical  Suicidal Thoughts:  No  Homicidal Thoughts:  No  Memory:  Immediate;   Good  Judgement:  Good  Insight:  Good  Psychomotor Activity:  Normal  Concentration:  Concentration: Good  Recall:  Good  Fund of Knowledge:  Good  Language:  Good  Akathisia:  No  Handed:  Right  AIMS (if indicated):     Assets:  Communication Skills Desire for Improvement Financial Resources/Insurance Housing Intimacy Leisure Time Resilience Social Support Talents/Skills Transportation Vocational/Educational  ADL's:  Intact  Cognition:  WNL  Sleep:        Assessment and Plan:     02/18/2023    2:28 PM 10/29/2022   10:08 AM 10/25/2022   10:39 PM 06/18/2022    1:39 PM 04/30/2022   11:48 AM  Depression screen PHQ 2/9  Decreased Interest 0 0 0 0 0  Down, Depressed, Hopeless 1 0 0 0 1  PHQ - 2 Score 1 0 0 0 1    Flowsheet Row [facility] Video Visit from 10/29/2022 in [facility] Video Visit from 06/18/2022 in [facility]  C-SSRS RISK CATEGORY No Risk No Risk No Risk          Pt is aware that these meds carry a teratogenic risk. Pt will discuss plan of action if she does or plans to become pregnant in the future.  Status  of current problems: worsening alcohol use and mild worsening of depression   Medication management with supportive therapy. Risks and benefits, side effects and alternative treatment options discussed with patient. Pt was given an opportunity to ask questions about medication, illness, and treatment. All current psychiatric medications have been reviewed and discussed with the patient and adjusted as clinically appropriate.  Pt verbalized understanding and verbal consent obtained for treatment.  Meds: d/c Trintellix D/c Wellbutrin Restart Antabuse 250mg  po qD- in the past she took 1/4 of it start Prozac 20mg  po qD 1.  Alcohol use disorder, mild, abuse - disulfiram (ANTABUSE) 250 MG tablet; Take 1 tablet (250 mg total) by mouth daily.  Dispense: 1 tablet; Refill: 0  2. MDD (major depressive disorder), recurrent episode, moderate (HCC) - FLUoxetine (PROZAC) 20 MG capsule; Take 1 capsule (20 mg total) by mouth daily.  Dispense: 90 capsule; Refill: 0     Labs: none    Therapy: brief supportive therapy provided. Discussed psychosocial stressors in detail.    Recommended pt stop all drug and alcohol use. Talked about the benefit of sobriety partner to help improve compliance.    Collaboration of Care: none  Patient/Guardian was advised Release of Information must be obtained prior to any record release in order to collaborate their care with an outside provider. Patient/Guardian was advised if they have not already done so to contact the registration department to sign all necessary forms in order for [place] to release information regarding their care.   Consent: Patient/Guardian gives verbal consent for treatment and assignment of benefits for services provided during this visit. Patient/Guardian expressed understanding and agreed to proceed.      Follow Up Instructions: Follow up in 1-2 months or sooner if needed with a new psychiatrist  Patient informed that I am leaving [facility] in 03/2023 and I relayed that they will be getting a new provider after that. Patient verbalized understanding and agreed with the plan.     I discussed the assessment and treatment plan with the patient. The patient was provided an opportunity to ask questions and all were answered. The patient agreed with the plan and demonstrated an understanding of the instructions.   The patient was advised to call back or seek an in-person evaluation if the symptoms worsen or if the condition fails to improve as anticipated.  I provided 22 minutes of non-face-to-face time during this encounter.   Oletta Darter, MD

## 2023-04-26 ENCOUNTER — Encounter (HOSPITAL_COMMUNITY): Payer: Self-pay | Admitting: Student

## 2023-04-26 ENCOUNTER — Ambulatory Visit (HOSPITAL_BASED_OUTPATIENT_CLINIC_OR_DEPARTMENT_OTHER): Payer: BC Managed Care – PPO | Admitting: Student

## 2023-04-26 DIAGNOSIS — F1010 Alcohol abuse, uncomplicated: Secondary | ICD-10-CM

## 2023-04-26 DIAGNOSIS — F330 Major depressive disorder, recurrent, mild: Secondary | ICD-10-CM

## 2023-04-26 DIAGNOSIS — F331 Major depressive disorder, recurrent, moderate: Secondary | ICD-10-CM

## 2023-04-26 MED ORDER — FLUOXETINE HCL 20 MG PO CAPS
20.0000 mg | ORAL_CAPSULE | Freq: Every day | ORAL | 0 refills | Status: DC
Start: 2023-04-26 — End: 2023-08-18

## 2023-04-26 NOTE — Progress Notes (Unsigned)
BH MD Outpatient Progress Note  04/26/2023 1:09 PM Jacqueline Banks  MRN:  [id number]  Assessment:  Jacqueline Banks presents for follow-up evaluation in-person. Today, 04/27/23, patient reports doing very well since changing from the Trintellix and Wellbutrin to Prozac. She reports good mood, sleeping well, and desire to care for herself. The disulfiram has also been beneficial in reducing her alcohol consumption. Taking one quarter of a tablet gives her a flushing response that provides diversion from alcohol. She is developing improved insight into the root of her alcohol consumption. While she is not open to therapy, substance use counseling, or AA at this time, we agreed to use a portion of our scheduled time, as permitted, to continue to explore this.  She provides no safety concerns and is not manic/hypomanic nor psychotic. We are making no medication adjustments today, as she is tolerating current regimen without AE.  PHQ-9= 1 GAD-7= 0  Identifying Information: Jacqueline Banks is a 57 y.o. female with a history of MDD who is an established patient with [facility] for management of medications, depression, and alcohol use.   Risk Assessment: An assessment of suicide and violence risk factors was performed as part of this evaluation and is not significantly changed from the last visit.             While future psychiatric events cannot be accurately predicted, the patient does not currently require acute inpatient psychiatric care and does not currently meet [facility] involuntary commitment criteria.          Plan:  # MDD Past medication trials:  Status of problem: Mild, Stable Interventions: -- Continue Prozac 20 mg daily  # Alcohol Use Disorder, mild Past medication trials:  Status of problem: Mild Interventions: -- Continue Disulfiram 250 mg daily; patient taking 0.25 tablet   Return to care in 6-8 weeks, virtually  Patient was given  contact information for behavioral health clinic and was instructed to call 911 for emergencies.    Patient and plan of care will be discussed with the Attending MD ,Jacqueline Banks, who agrees with the above statement and plan.   Subjective:  Chief Complaint:  Chief Complaint  Patient presents with   Follow-up   Depression   Medication Refill   Drug / Alcohol Assessment    Interval History: Things have been going well since last visit, even with medication changes.  Helped with intrusive thoughts of negative self-talk. Sleeping well, appetite intact, energy level good. Kickboxing, 3 days per week. Still in [employer] job. Now, she is making herself get dressed for work and has less difficulty cleaning up her home.   Denies AE of medications.   She now watches her favorite TV show with water instead of wine. She reports discontinuing wine while alone, but drinks wine socially; most recently while in [place]. She has not vomited from disulfiram, but it gives her a heat rash/ hot sensation. She is taking the disulfiram every couple of days, since it is long-lasting.   Alcohol use increased when brother passed away unexpectedly, she began having menopause, and laid off from job (felt as though she was not worth enough to keep).   She denies SI, HI, AVH. Denies tobacco, illicit drugs.   In addition to prescribed medications, she is taking Red Rice supplement, helping with cholesterol. She is not eating meat but has a healthy diet. Fluid intake typically water. Coffee 2 cups in AM. Denies other caffeinated beverages.    Visit  Diagnosis:    ICD-10-CM   1. MDD (major depressive disorder), recurrent episode, mild (HCC)  F33.0 FLUoxetine (PROZAC) 20 MG capsule    2. Alcohol use disorder, mild, abuse  F10.10       Past Psychiatric History:  Diagnoses: depression dx at 57yo by therapist  Medication trials: trintellix effective but made her sick; zoloft started by pcp  Previous psychiatrist/therapist:  Dr. Michae Banks for psychiatry; therapy with [facility] for 1 yr in 2020 Hospitalizations: Denies Suicide attempts: Denies SIB: Denies Hx of violence towards others: Denies Current access to guns: Denies Hx of trauma/abuse: Denies Substance use: Denies  Past Medical History:  Past Medical History:  Diagnosis Date   Depression    pt denies    Eczema    Major depression, recurrent (HCC) 04/06/2016   Vitamin D deficiency     Past Surgical History:  Procedure Laterality Date   chin tuck  03/2022   COLONOSCOPY  2007   normal   COLONOSCOPY WITH PROPOFOL  11/14/2020   JacquelineNandigam   LAPAROSCOPIC APPENDECTOMY N/A 02/20/2021   Procedure: APPENDECTOMY LAPAROSCOPIC;  Surgeon: Jacqueline Levee, MD;  Location: [facility];  Service: General;  Laterality: N/A;   LASIK  2002   WISDOM TOOTH EXTRACTION      Family Psychiatric History:   Family History:  Family History  Problem Relation Age of Onset   Bipolar disorder Sister    Anxiety disorder Brother    Melanoma Mother 27   Diabetes Father    Obesity Father    Colon polyps Father 64   Colon cancer Neg Hx    Esophageal cancer Neg Hx    Stomach cancer Neg Hx    Rectal cancer Neg Hx     Social History:  Academic/Vocational:  Social History   Socioeconomic History   Marital status: Married    Spouse name: Not on file   Number of children: 4   Years of education: 16   Highest education level: Bachelor's degree (e.g., BA, AB, BS)  Occupational History   Not on file  Tobacco Use   Smoking status: Never   Smokeless tobacco: Never  Vaping Use   Vaping status: Never Used  Substance and Sexual Activity   Alcohol use: Not Currently    Comment: last 09/14/2021   Drug use: Never   Sexual activity: Yes    Partners: Male    Birth control/protection: Post-menopausal  Other Topics Concern   Not on file  Social History Narrative      Current living in [place] with husand and 2 kids   Born and raised in [place] by both parents    Siblings 6 total- pt is 3rd child- 4 boys and 2 girls   Married 20yrs      Legal issues- denies   Social Determinants of Corporate investment banker Strain: Not on file  Food Insecurity: Not on file  Transportation Needs: Not on file  Physical Activity: Not on file  Stress: Not on file  Social Connections: Not on file    Allergies: No Known Allergies  Current Medications: Current Outpatient Medications  Medication Sig Dispense Refill   BIOTIN PO Take 1 capsule by mouth daily.     cholecalciferol (VITAMIN D3) 25 MCG (1000 UNIT) tablet Take 1,000 Units by mouth daily.     disulfiram (ANTABUSE) 250 MG tablet Take 1 tablet (250 mg total) by mouth daily. 1 tablet 0   FLUoxetine (PROZAC) 20 MG capsule Take 1 capsule (20 mg total)  by mouth daily. 90 capsule 0   No current facility-administered medications for this visit.    ROS: Review of Systems  Constitutional:  Negative for activity change, appetite change and unexpected weight change.  Gastrointestinal: Negative.   Musculoskeletal: Negative.   Neurological:  Negative for dizziness.     Objective:  Psychiatric Specialty Exam: Blood pressure 109/67, pulse 82, height 5\' 8"  (1.727 m), weight 153 lb 9.6 oz (69.7 kg), SpO2 100%.Body mass index is 23.35 kg/m.  General Appearance: Well Groomed  Eye Contact:  Good  Speech:  Clear and Coherent and Normal Rate  Volume:  Normal  Mood:  Euthymic  Affect:  Appropriate and Congruent  Thought Content: WDL and Logical   Suicidal Thoughts:  No  Homicidal Thoughts:  No  Thought Process:  Coherent and Linear  Orientation:  Full (Time, Place, and Person)    Memory: Immediate;   Good Recent;   Good Remote;   Good  Judgment:  Good  Insight:  Good  Concentration:  Concentration: Good and Attention Span: Good  Recall: not formally assessed   Fund of Knowledge: Good  Language: Good  Psychomotor Activity:  Normal  Akathisia:  No  AIMS (if indicated): not done  Assets:   Communication Skills Desire for Improvement Housing Intimacy Leisure Time Physical Health Resilience Social Support Transportation Vocational/Educational  ADL's:  Intact  Cognition: WNL  Sleep:  Good   PE: General: well-appearing; no acute distress  Pulm: no increased work of breathing on room air  Strength & Muscle Tone: within normal limits Neuro: no focal neurological deficits observed  Gait & Station: normal  Metabolic Disorder Labs: Lab Results  Component Value Date   HGBA1C 5.6 10/22/2022   MPG 114 10/22/2022   MPG 108 10/21/2021   No results found for: "PROLACTIN" Lab Results  Component Value Date   CHOL 215 (H) 01/27/2023   TRIG 89 01/27/2023   HDL 88 01/27/2023   CHOLHDL 2.4 01/27/2023   VLDL 11 04/06/2016   LDLCALC 109 (H) 01/27/2023   LDLCALC 130 (H) 10/22/2022   Lab Results  Component Value Date   TSH 3.88 10/22/2022   TSH 1.85 10/21/2021    Therapeutic Level Labs: No results found for: "LITHIUM" No results found for: "VALPROATE" No results found for: "CBMZ"  Screenings: PHQ2-9    Flowsheet Row Office Visit from 04/26/2023 in [facility] Video Visit from 02/18/2023 in [facility] Video Visit from 10/29/2022 in [facility] Office Visit from 10/22/2022 in [facility] Video Visit from 06/18/2022 in [facility]  PHQ-2 Total Score 0 1 0 0 0      Flowsheet Row Office Visit from 04/26/2023 in [facility] Video Visit from 02/18/2023 in [facility] Video Visit from 10/29/2022 in [facility]  C-SSRS RISK CATEGORY No Risk No Risk No Risk       Collaboration of Care: Collaboration of Care: Jacqueline Banks  Patient/Guardian was advised Release of  Information must be obtained prior to any record release in order to collaborate their care with an outside provider. Patient/Guardian was advised if they have not already done so to contact the registration department to sign all necessary forms in order for [place] to release information regarding their care.   Consent: Patient/Guardian gives verbal consent for treatment and assignment of benefits for services provided during this visit. Patient/Guardian expressed understanding and agreed  to proceed.   Lamar Sprinkles, MD 04/26/2023 1:09 PM

## 2023-04-27 ENCOUNTER — Encounter (HOSPITAL_COMMUNITY): Payer: Self-pay | Admitting: Student

## 2023-04-27 DIAGNOSIS — F3341 Major depressive disorder, recurrent, in partial remission: Secondary | ICD-10-CM | POA: Insufficient documentation

## 2023-04-27 DIAGNOSIS — F330 Major depressive disorder, recurrent, mild: Secondary | ICD-10-CM | POA: Insufficient documentation

## 2023-04-29 NOTE — Addendum Note (Signed)
Addended by: Theodoro Kos A on: 04/29/2023 03:24 PM   Modules accepted: Level of Service

## 2023-05-12 ENCOUNTER — Encounter: Payer: Self-pay | Admitting: Internal Medicine

## 2023-05-12 NOTE — Patient Instructions (Addendum)
Due to recent changes in healthcare laws, you may see the results of your imaging and laboratory studies on MyChart before your provider has had a chance to review them.  We understand that in some cases there may be results that are confusing or concerning to you. Not all laboratory results come back in the same time frame and the provider may be waiting for multiple results in order to interpret others.  Please give [place] 48 hours in order for your provider to thoroughly review all the results before contacting the office for clarification of your results.  ++++++++++++++++++++++++++ PLEASE STOP BIOTIN   - usual amount of Biotin iin a Multivitamin is about 30 mcg ( & is OK) , but   Unfortunately it's frequently sold OTC alone or in Skin, hair, nail products in    amounts of 1,000 mcg  -  2,500 mcg to 5,000 mcg & I've even seen it at 10,000 mcg     - The problem being  is that Biotin causes the lab analyzer machines    that measure the thyroid hormone  (& other blood measurements) to    yield incorrect results  & if the Medical provider is unaware of this,    then incorrect changes in medicines ( thyroid & other ) are recommended based on    incorrect results & there have been reports of Death due to patients taking Biotin.    But since it's not a regulated substance, the FDA does nothing !   - Taking Biotin will make it look like the Thyroid is too low in the blood & then    If the Doctor prescribes thyroid, then there would be too much in the blood &    could cause a heart arrhythmia or severe hypertension  leading to a stroke or heart attack   ++++++++++++++++++++++++++  Vit D  & Vit C 1,000 mg   are recommended to help protect  against the Covid-19 and other Corona viruses.    Also it's recommended  to take  Zinc 50 mg  to help  protect against the Covid-19   and best place to get  is also on [email or web address]  and don't pay more than 6-8 cents /pill !    +++++++++++++++++++++++++++++++++++++++ Recommend Adult Low Dose Aspirin or  coated  Aspirin 81 mg daily  To reduce risk of Colon Cancer 40 %,  Skin Cancer 26 % ,  Melanoma 46%  and  Pancreatic cancer 60% +++++++++++++++++++++++++++++++++++++++++ Vitamin D goal  is between 70-100.  Please make sure that you are taking your Vitamin D as directed.  It is very important as a natural anti-inflammatory  helping hair, skin, and nails, as well as reducing stroke and heart attack risk.  It helps your bones and helps with mood. It also decreases numerous cancer risks so please take it as directed.  Low Vit D is associated with a 200-300% higher risk for CANCER  and 200-300% higher risk for HEART   ATTACK  &  STROKE.   .....................................Marland Kitchen It is also associated with higher death rate at younger ages,  autoimmune diseases like Rheumatoid arthritis, Lupus, Multiple Sclerosis.    Also many other serious conditions, like depression, Alzheimer's Dementia, infertility, muscle aches, fatigue, fibromyalgia - just to name a few. +++++++++++++++++++++++++++++++++++++++++ Recommend the book "The END of DIETING" by Dr Monico Hoar  & the book "The END of DIABETES " by Dr Monico Hoar At [email or web address] - get book & Audio CD's  Being diabetic has a  300% increased risk for heart attack, stroke, cancer, and alzheimer- type vascular dementia. It is very important that you work harder with diet by avoiding all foods that are white. Avoid white rice (brown & wild rice is OK), white potatoes (sweetpotatoes in moderation is OK), White bread or wheat bread or anything made out of white flour like bagels, donuts, rolls, buns, biscuits, cakes, pastries, cookies, pizza crust, and pasta (made from white flour & egg whites) - vegetarian pasta or spinach or wheat pasta is OK. Multigrain breads like Arnold's or Pepperidge [employer], or multigrain sandwich thins or flatbreads.  Diet, exercise and weight loss can  reverse and cure diabetes in the early stages.  Diet, exercise and weight loss is very important in the control and prevention of complications of diabetes which affects every system in your body, ie. Brain - dementia/stroke, eyes - glaucoma/blindness, heart - heart attack/heart failure, kidneys - dialysis, stomach - gastric paralysis, intestines - malabsorption, nerves - severe painful neuritis, circulation - gangrene & loss of a leg(s), and finally cancer and Alzheimers.    I recommend avoid fried & greasy foods,  sweets/candy, white rice (brown or wild rice or Quinoa is OK), white potatoes (sweet potatoes are OK) - anything made from white flour - bagels, doughnuts, rolls, buns, biscuits,white and wheat breads, pizza crust and traditional pasta made of white flour & egg white(vegetarian pasta or spinach or wheat pasta is OK).  Multi-grain bread is OK - like multi-grain flat bread or sandwich thins. Avoid alcohol in excess. Exercise is also important.    Eat all the vegetables you want - avoid meat, especially red meat and dairy - especially cheese.  Cheese is the most concentrated form of trans-fats which is the worst thing to clog up our arteries. Veggie cheese is OK which can be found in the fresh produce section at [employer] or [employer] or [employer]  +++++++++++++++++++++++++++++++++++++++ DASH Eating Plan  DASH stands for "Dietary Approaches to Stop Hypertension."   The DASH eating plan is a healthy eating plan that has been shown to reduce high blood pressure (hypertension). Additional health benefits may include reducing the risk of type 2 diabetes mellitus, heart disease, and stroke. The DASH eating plan may also help with weight loss. WHAT DO I NEED TO KNOW ABOUT THE DASH EATING PLAN? For the DASH eating plan, you will follow these general guidelines: Choose foods with a percent daily value for sodium of less than 5% (as listed on the food label). Use salt-free seasonings or herbs  instead of table salt or sea salt. Check with your health care provider or pharmacist before using salt substitutes. Eat lower-sodium products, often labeled as "lower sodium" or "no salt added." Eat fresh foods. Eat more vegetables, fruits, and low-fat dairy products. Choose whole grains. Look for the word "whole" as the first word in the ingredient list. Choose fish  Limit sweets, desserts, sugars, and sugary drinks. Choose heart-healthy fats. Eat veggie cheese  Eat more home-cooked food and less restaurant, buffet, and fast food. Limit fried foods. Cook foods using methods other than frying. Limit canned vegetables. If you do use them, rinse them well to decrease the sodium. When eating at a restaurant, ask that your food be prepared with less salt, or no salt if possible.                      WHAT FOODS CAN I EAT? Read Dr Andris Baumann books  on The End of Dieting & The End of Diabetes  Grains Whole grain or whole wheat bread. Brown rice. Whole grain or whole wheat pasta. Quinoa, bulgur, and whole grain cereals. Low-sodium cereals. Corn or whole wheat flour tortillas. Whole grain cornbread. Whole grain crackers. Low-sodium crackers.  Vegetables Fresh or frozen vegetables (raw, steamed, roasted, or grilled). Low-sodium or reduced-sodium tomato and vegetable juices. Low-sodium or reduced-sodium tomato sauce and paste. Low-sodium or reduced-sodium canned vegetables.   Fruits All fresh, canned (in natural juice), or frozen fruits.  Protein Products  All fish and seafood.  Dried beans, peas, or lentils. Unsalted nuts and seeds. Unsalted canned beans.  Dairy Low-fat dairy products, such as skim or 1% milk, 2% or reduced-fat cheeses, low-fat ricotta or cottage cheese, or plain low-fat yogurt. Low-sodium or reduced-sodium cheeses.  Fats and Oils Tub margarines without trans fats. Light or reduced-fat mayonnaise and salad dressings (reduced sodium). Avocado. Safflower, olive, or canola  oils. Natural peanut or almond butter.  Other Unsalted popcorn and pretzels. The items listed above may not be a complete list of recommended foods or beverages. Contact your dietitian for more options.  +++++++++++++++  WHAT FOODS ARE NOT RECOMMENDED? Grains/ White flour or wheat flour White bread. White pasta. White rice. Refined cornbread. Bagels and croissants. Crackers that contain trans fat.  Vegetables  Creamed or fried vegetables. Vegetables in a . Regular canned vegetables. Regular canned tomato sauce and paste. Regular tomato and vegetable juices.  Fruits Dried fruits. Canned fruit in light or heavy syrup. Fruit juice.  Meat and Other Protein Products Meat in general - RED meat & White meat.  Fatty cuts of meat. Ribs, chicken wings, all processed meats as bacon, sausage, bologna, salami, fatback, hot dogs, bratwurst and packaged luncheon meats.  Dairy Whole or 2% milk, cream, half-and-half, and cream cheese. Whole-fat or sweetened yogurt. Full-fat cheeses or blue cheese. Non-dairy creamers and whipped toppings. Processed cheese, cheese spreads, or cheese curds.  Condiments Onion and garlic salt, seasoned salt, table salt, and sea salt. Canned and packaged gravies. Worcestershire sauce. Tartar sauce. Barbecue sauce. Teriyaki sauce. Soy sauce, including reduced sodium. Steak sauce. Fish sauce. Oyster sauce. Cocktail sauce. Horseradish. Ketchup and mustard. Meat flavorings and tenderizers. Bouillon cubes. Hot sauce. Tabasco sauce. Marinades. Taco seasonings. Relishes.  Fats and Oils Butter, stick margarine, lard, shortening and bacon fat. Coconut, palm kernel, or palm oils. Regular salad dressings.  Pickles and olives. Salted popcorn and pretzels.  The items listed above may not be a complete list of foods and beverages to avoid.

## 2023-05-12 NOTE — Progress Notes (Unsigned)
Future Appointments  Date Time Provider Department  05/13/2023                       6 mo ov  9:30 AM Lucky Cowboy, MD [facility]  07/28/2023  1:00 PM Lamar Sprinkles, MD [facility]  10/28/2023                        cpe  2:00 PM Lucky Cowboy, MD [facility]    History of Present Illness:       This very nice 57 y.o. MWF  presents for 6 month follow up with labile HTN, HLD, glucose intolerance and Vitamin D Deficiency.         Patient is followed expectantly for labile elevated BP w/o dx HTN  & BP has been controlled at home. Today's BP is at goal - 100/60. Patient has had no complaints of any cardiac type chest pain, palpitations, dyspnea Pollyann Kennedy /PND, dizziness, claudication or dependent edema.        Hyperlipidemia is not controlled with diet. Last Lipids were not at goal :  Lab Results  Component Value Date   CHOL 215 (H) 01/27/2023   HDL 88 01/27/2023   LDLCALC 109 (H) 01/27/2023   TRIG 89 01/27/2023   CHOLHDL 2.4 01/27/2023      Also, the patient is also monitored expectantly for glucose intolerance and has had no symptoms of reactive hypoglycemia, diabetic polys, paresthesias or visual blurring.  Last A1c was normal & at goal :  Lab Results  Component Value Date   HGBA1C 5.6 10/22/2022                                                          Further, the patient also has history of Vitamin D Deficiency and supplements vitamin D. Last vitamin D was near goal (70-100) :  Lab Results  Component Value Date   VD25OH 55 01/27/2023     Current Outpatient Medications on File Prior to Visit  Medication Sig   BIOTIN  Take 1 capsule  daily.   VITAMIN D 1000 tablet Take  daily.   disulfiram (ANTABUSE) 250 MG tablet Take 1 tablet  daily.   FLUoxetine (PROZAC) 20 MG capsule Take 1 capsule daily.    No Known Allergies    PMHx:   Past Medical History:  Diagnosis Date   Depression    pt denies    Eczema    Major depression, recurrent (HCC) 04/06/2016    Vitamin D deficiency      Immunization History  Administered Date(s) Administered   Influenza Inj Mdck Quad With Preservative 07/13/2017, 08/04/2018   Td 10/21/2020   Tdap 09/14/2010     Past Surgical History:  Procedure Laterality Date   chin tuck  03/2022   COLONOSCOPY  2007   normal   COLONOSCOPY WITH PROPOFOL  11/14/2020   Dr.Nandigam   LAPAROSCOPIC APPENDECTOMY N/A 02/20/2021   Procedure: APPENDECTOMY LAPAROSCOPIC;  Surgeon: Romie Levee, MD;  Location: [facility];  Service: General;  Laterality: N/A;   LASIK  2002   WISDOM TOOTH EXTRACTION       FHx:    Reviewed / unchanged   SHx:    Reviewed / unchanged    Systems Review:  Constitutional: Denies fever,  chills, wt changes, headaches, insomnia, fatigue, night sweats, change in appetite. Eyes: Denies redness, blurred vision, diplopia, discharge, itchy, watery eyes.  ENT: Denies discharge, congestion, post nasal drip, epistaxis, sore throat, earache, hearing loss, dental pain, tinnitus, vertigo, sinus pain, snoring.  CV: Denies chest pain, palpitations, irregular heartbeat, syncope, dyspnea, diaphoresis, orthopnea, PND, claudication or edema. Respiratory: denies cough, dyspnea, DOE, pleurisy, hoarseness, laryngitis, wheezing.  Gastrointestinal: Denies dysphagia, odynophagia, heartburn, reflux, water brash, abdominal pain or cramps, nausea, vomiting, bloating, diarrhea, constipation, hematemesis, melena, hematochezia  or hemorrhoids. Genitourinary: Denies dysuria, frequency, urgency, nocturia, hesitancy, discharge, hematuria or flank pain. Musculoskeletal: Denies arthralgias, myalgias, stiffness, jt. swelling, pain, limping or strain/sprain.  Skin: Denies pruritus, rash, hives, warts, acne, eczema or change in skin lesion(s). Neuro: No weakness, tremor, incoordination, spasms, paresthesia or pain. Psychiatric: Denies confusion, memory loss or sensory loss. Endo: Denies change in weight, skin or hair change.  Heme/Lymph:  No excessive bleeding, bruising or enlarged lymph nodes.   Physical Exam  BP 100/60   Pulse 75   Temp 97.9 F (36.6 C)   Resp 16   Ht 5' 7.5" (1.715 m)   Wt 154 lb 12.8 oz (70.2 kg)   SpO2 96%   BMI 23.89 kg/m   Appears  well nourished, well groomed  and in no distress.  Eyes: PERRLA, EOMs, conjunctiva no swelling or erythema. Sinuses: No frontal/maxillary tenderness ENT/Mouth: EAC's clear, TM's nl w/o erythema, bulging. Nares clear w/o erythema, swelling, exudates. Oropharynx clear without erythema or exudates. Oral hygiene is good. Tongue normal, non obstructing. Hearing intact.  Neck: Supple. Thyroid not palpable. Car 2+/2+ without bruits, nodes or JVD. Chest: Respirations nl with BS clear & equal w/o rales, rhonchi, wheezing or stridor.  Cor: Heart sounds normal w/ regular rate and rhythm without sig. murmurs, gallops, clicks or rubs. Peripheral pulses normal and equal  without edema.  Abdomen: Soft & bowel sounds normal. Non-tender w/o guarding, rebound, hernias, masses or organomegaly.  Lymphatics: Unremarkable.  Musculoskeletal: Full ROM all peripheral extremities, joint stability, 5/5 strength and normal gait.  Skin: Warm, dry without exposed rashes, lesions or ecchymosis apparent.  Neuro: Cranial nerves intact, reflexes equal bilaterally. Sensory-motor testing grossly intact. Tendon reflexes grossly intact.  Pysch: Alert & oriented x 3.  Insight and judgement nl & appropriate. No ideations.   Assessment and Plan:  1. Elevated BP without diagnosis of hypertension  - Continue medication, monitor blood pressure at home.  - Continue DASH diet.  Reminder to go to the ER if any CP,  SOB, nausea, dizziness, severe HA, changes vision/speech.    - CBC with Differential/Platelet - COMPLETE METABOLIC PANEL WITH GFR - Magnesium - TSH   2. Screening cholesterol level  - Continue diet/meds, exercise,& lifestyle modifications.  - Continue monitor periodic cholesterol/liver  & renal functions     - Lipid panel - TSH   3. Abnormal glucose  - Continue diet, exercise  - Lifestyle modifications.  - Monitor appropriate labs   - Hemoglobin A1c - Insulin, random   4. Vitamin D deficiency  - Continue supplementation.   - VITAMIN D 25 Hydroxy    5. Medication management  - CBC with Differential/Platelet - COMPLETE METABOLIC PANEL WITH GFR - Magnesium - Lipid panel - TSH - Hemoglobin A1c - Insulin, random - VITAMIN D 25 Hydroxy          Discussed  regular exercise, BP monitoring, weight control to achieve/maintain BMI less than 25 and discussed med and SE's. Recommended labs to assess /  monitor clinical status .  I discussed the assessment and treatment plan with the patient. The patient was provided an opportunity to ask questions and all were answered. The patient agreed with the plan and demonstrated an understanding of the instructions.  I provided over 30 minutes of exam, counseling, chart review and  complex critical decision making.        The patient was advised to call back or seek an in-person evaluation if the symptoms worsen or if the condition fails to improve as anticipated.   Marinus Maw, MD

## 2023-05-13 ENCOUNTER — Encounter: Payer: Self-pay | Admitting: Internal Medicine

## 2023-05-13 ENCOUNTER — Ambulatory Visit: Payer: BC Managed Care – PPO | Admitting: Internal Medicine

## 2023-05-13 VITALS — BP 100/60 | HR 75 | Temp 97.9°F | Resp 16 | Ht 67.5 in | Wt 154.8 lb

## 2023-05-13 DIAGNOSIS — R7309 Other abnormal glucose: Secondary | ICD-10-CM | POA: Diagnosis not present

## 2023-05-13 DIAGNOSIS — Z79899 Other long term (current) drug therapy: Secondary | ICD-10-CM

## 2023-05-13 DIAGNOSIS — Z13220 Encounter for screening for lipoid disorders: Secondary | ICD-10-CM

## 2023-05-13 DIAGNOSIS — E782 Mixed hyperlipidemia: Secondary | ICD-10-CM

## 2023-05-13 DIAGNOSIS — E559 Vitamin D deficiency, unspecified: Secondary | ICD-10-CM | POA: Diagnosis not present

## 2023-05-13 DIAGNOSIS — R030 Elevated blood-pressure reading, without diagnosis of hypertension: Secondary | ICD-10-CM

## 2023-05-14 LAB — INSULIN, RANDOM: Insulin: 5 u[IU]/mL

## 2023-05-14 LAB — CBC WITH DIFFERENTIAL/PLATELET
Absolute Monocytes: 305 {cells}/uL (ref 200–950)
Basophils Absolute: 60 {cells}/uL (ref 0–200)
Basophils Relative: 1.4 %
Eosinophils Absolute: 99 {cells}/uL (ref 15–500)
Eosinophils Relative: 2.3 %
HCT: 39 % (ref 35.0–45.0)
Hemoglobin: 13 g/dL (ref 11.7–15.5)
Lymphs Abs: 1367 {cells}/uL (ref 850–3900)
MCH: 30.8 pg (ref 27.0–33.0)
MCHC: 33.3 g/dL (ref 32.0–36.0)
MCV: 92.4 fL (ref 80.0–100.0)
MPV: 10.5 fL (ref 7.5–12.5)
Monocytes Relative: 7.1 %
Neutro Abs: 2468 {cells}/uL (ref 1500–7800)
Neutrophils Relative %: 57.4 %
Platelets: 261 10*3/uL (ref 140–400)
RBC: 4.22 Million/uL (ref 3.80–5.10)
RDW: 11.9 % (ref 11.0–15.0)
Total Lymphocyte: 31.8 %
WBC: 4.3 10*3/uL (ref 3.8–10.8)

## 2023-05-14 LAB — TSH: TSH: 1.28 m[IU]/L (ref 0.40–4.50)

## 2023-05-14 LAB — VITAMIN D 25 HYDROXY (VIT D DEFICIENCY, FRACTURES): Vit D, 25-Hydroxy: 61 ng/mL (ref 30–100)

## 2023-05-14 LAB — COMPLETE METABOLIC PANEL WITH GFR
AG Ratio: 1.6 (calc) (ref 1.0–2.5)
ALT: 13 U/L (ref 6–29)
AST: 22 U/L (ref 10–35)
Albumin: 4.6 g/dL (ref 3.6–5.1)
Alkaline phosphatase (APISO): 58 U/L (ref 37–153)
BUN: 17 mg/dL (ref 7–25)
CO2: 27 mmol/L (ref 20–32)
Calcium: 10 mg/dL (ref 8.6–10.4)
Chloride: 104 mmol/L (ref 98–110)
Creat: 0.76 mg/dL (ref 0.50–1.03)
Globulin: 2.8 g/dL (ref 1.9–3.7)
Glucose, Bld: 91 mg/dL (ref 65–99)
Potassium: 4.4 mmol/L (ref 3.5–5.3)
Sodium: 141 mmol/L (ref 135–146)
Total Bilirubin: 0.5 mg/dL (ref 0.2–1.2)
Total Protein: 7.4 g/dL (ref 6.1–8.1)
eGFR: 91 mL/min/{1.73_m2} (ref >=60)

## 2023-05-14 LAB — LIPID PANEL
Cholesterol: 222 mg/dL — ABNORMAL HIGH (ref <200)
HDL: 96 mg/dL (ref >=50)
LDL Cholesterol (Calc): 107 mg/dL — ABNORMAL HIGH
Non-HDL Cholesterol (Calc): 126 mg/dL (ref <130)
Total CHOL/HDL Ratio: 2.3 (calc) (ref <5.0)
Triglycerides: 101 mg/dL (ref <150)

## 2023-05-14 LAB — MAGNESIUM: Magnesium: 2.1 mg/dL (ref 1.5–2.5)

## 2023-05-14 LAB — HEMOGLOBIN A1C
Hgb A1c MFr Bld: 5.3 %{Hb} (ref <5.7)
Mean Plasma Glucose: 105 mg/dL
eAG (mmol/L): 5.8 mmol/L

## 2023-05-15 NOTE — Progress Notes (Signed)
<>*<>*<>*<>*<>*<>*<>*<>*<>*<>*<>*<>*<>*<>*<>*<>*<>*<>*<>*<>*<>*<>*<>*<>*<> <>*<>*<>*<>*<>*<>*<>*<>*<>*<>*<>*<>*<>*<>*<>*<>*<>*<>*<>*<>*<>*<>*<>*<>*<>  -Test results slightly outside the reference range are not unusual. If there is anything important, I will review this with you,  otherwise it is considered normal test values.  If you have further questions,  please do not hesitate to contact me at the office or via My Chart.   <>*<>*<>*<>*<>*<>*<>*<>*<>*<>*<>*<>*<>*<>*<>*<>*<>*<>*<>*<>*<>*<>*<>*<>*<> <>*<>*<>*<>*<>*<>*<>*<>*<>*<>*<>*<>*<>*<>*<>*<>*<>*<>*<>*<>*<>*<>*<>*<>*<>  - Elevated total Chol = 222 is "OK" since have such a high good HDL Chol =96  But the bad LDL Chol = 107 is too high - Ideal or goal is less than 70  !   - So need to work on diet   - Cholesterol only comes from animal sources                                                                              - ie. meat, dairy, egg yolks  - Eat all the vegetables you want.                                 - Avoid Meat, Avoid Meat,  Avoid Meat                                                              - especially Red Meat - Beef AND Pork .  - Avoid cheese & dairy - milk & ice cream.     - Cheese is the most concentrated form of trans-fats which                                                             is the worst thing to clog up our arteries.    - Veggie cheese is OK which can be found in the fresh                                produce section at [employer] or [employer] or [employer]  <>*<>*<>*<>*<>*<>*<>*<>*<>*<>*<>*<>*<>*<>*<>*<>*<>*<>*<>*<>*<>*<>*<>*<>*<>  -  Vitamin d = 61 - Excellent - Please keep dose same   - Vitamin D goal is between 60-100.    - It is very important as a natural anti-inflammatory and helping the                           immune system protect against viral infections, like the Covid-19  helping hair, skin, and nails, as well as reducing stroke and heart attack risk.   -  It helps your bones and helps with mood.  - It also decreases numerous cancer risks so please  take it as directed.   - Low Vit D is associated with a 200-300% higher risk for CANCER   and 200-300% higher risk for HEART   ATTACK  &  STROKE.    - It is also associated with higher death rate at younger ages,   autoimmune diseases like Rheumatoid arthritis, Lupus, Multiple Sclerosis.     - Also many other serious conditions, like depression, Alzheimer's  Dementia,  muscle aches, fatigue, fibromyalgia   <>*<>*<>*<>*<>*<>*<>*<>*<>*<>*<>*<>*<>*<>*<>*<>*<>*<>*<>*<>*<>*<>*<>*<>*<>  -  All Else - CBC - Kidneys - Electrolytes - Liver - Magnesium & Thyroid    - all  Normal / OK  <>*<>*<>*<>*<>*<>*<>*<>*<>*<>*<>*<>*<>*<>*<>*<>*<>*<>*<>*<>*<>*<>*<>*<>*<> <>*<>*<>*<>*<>*<>*<>*<>*<>*<>*<>*<>*<>*<>*<>*<>*<>*<>*<>*<>*<>*<>*<>*<>*<>

## 2023-07-28 ENCOUNTER — Telehealth (HOSPITAL_COMMUNITY): Payer: BC Managed Care – PPO | Admitting: Student

## 2023-08-18 ENCOUNTER — Encounter (HOSPITAL_COMMUNITY): Payer: Self-pay | Admitting: Student

## 2023-08-18 ENCOUNTER — Telehealth (HOSPITAL_COMMUNITY): Payer: BC Managed Care – PPO | Admitting: Student

## 2023-08-18 DIAGNOSIS — F330 Major depressive disorder, recurrent, mild: Secondary | ICD-10-CM | POA: Diagnosis not present

## 2023-08-18 DIAGNOSIS — F1010 Alcohol abuse, uncomplicated: Secondary | ICD-10-CM

## 2023-08-18 MED ORDER — FLUOXETINE HCL 20 MG PO CAPS
20.0000 mg | ORAL_CAPSULE | Freq: Every day | ORAL | 0 refills | Status: DC
Start: 2023-08-18 — End: 2023-12-07

## 2023-08-18 NOTE — Progress Notes (Signed)
Virtual Visit via Video Note   I connected with Jacqueline Banks  on 08/18/2023 by a video enabled telemedicine application and verified that I am speaking with the correct person using two identifiers.   Location: Patient: Home Provider: Clinic   I discussed the limitations of evaluation and management by telemedicine and the availability of in person appointments. The patient expressed understanding and agreed to proceed.   Follow Up Instructions:   I discussed the assessment and treatment plan with the patient. The patient was provided an opportunity to ask questions and all were answered. The patient agreed with the plan and demonstrated an understanding of the instructions.   The patient was advised to call back or seek an in-person evaluation if the symptoms worsen or if the condition fails to improve as anticipated.   I provided 21 minutes of non-face-to-face time during this encounter.   Lamar Sprinkles, MD   [facility] MD Outpatient Progress Note  08/18/2023 11:46 PM  LADYE WOOLCOCK  MRN:  [id number]  Assessment:  Jacqueline Banks presents for follow-up evaluation in-person. Today, 08/18/23, patient reports sustained improvements with Prozac. She reports good mood, sleeping well, and desire to care for herself.  Her alcohol consumption remains a problem, and patient reports that drinking wine has almost become a part of her daily routine.  While she is not drinking daily, she is drinking three quarters of a bottle of wine daily, Thursday through Sunday.  She would like to attempt achieving sobriety on her own without medications nor therapy.  We created a plan that we will follow up in 4 to 5 weeks and assess her success on her own.  She is advised that if she is not successful within this time frame, I will be recommending individual substance use therapy for additional support.  She is in agreement with this plan.  Per patient preference, we will discontinue the disulfiram, but  continue Prozac as prescribed.  She provides no safety concerns and is not manic/hypomanic nor psychotic.   Identifying Information: Jacqueline Banks is a 57 y.o. female with a history of MDD who is an established patient with [facility] for management of medications, depression, and alcohol use.   Risk Assessment: An assessment of suicide and violence risk factors was performed as part of this evaluation and is not significantly changed from the last visit.             While future psychiatric events cannot be accurately predicted, the patient does not currently require acute inpatient psychiatric care and does not currently meet [facility] involuntary commitment criteria.          Plan:  # MDD Past medication trials:  Status of problem: Mild, Stable Interventions: -- Continue Prozac 20 mg daily  # Alcohol Use Disorder, mild Past medication trials:  Status of problem: Mild Interventions: -- Discontinue disulfiram 250 mg daily per patient preference   Return to care in 4-5 weeks, in person  Patient was given contact information for behavioral health clinic and was instructed to call 911 for emergencies.    Patient and plan of care will be discussed with the Attending MD ,Dr. Mercy Riding, who agrees with the above statement and plan.   Subjective:  Chief Complaint:  Chief Complaint  Patient presents with   Follow-up   Depression   Alcohol Problem    Interval History: Things have been going well since last visit. She is tolerating her medications well. She is taking  the Prozac as scheduled. She is no longer engaging in negative self-talk. She is functioning better. She has been drinking alcohol less. Sleeping well, appetite intact, energy level good. She is still kickboxing, 3 days per week. Still in [employer] job. She is functioning better. Denies AE of medications.   She visited her siblings in [place] for the holidays, and she and her family had open  dialogue about their mental health.   She is no longer taking the Disulfiram. She is planning a "dry December (start date 12/3)" She is drinking only Thursday- Sunday; still consuming 3/4 of a bottle of wine. She reports drinking wine socially. We discuss Naltrexone and Acamprosate, but she declines. She believes alcohol to be more of a task, more embedded into her daily routine.   She denies SI, HI, AVH. Denies tobacco, illicit drugs.   In addition to prescribed medications, she is taking Red Rice supplement, helping with cholesterol. Biotin. She is eating but has a healthy diet. Fluid intake typically water. Coffee 2 cups in AM. Denies other caffeinated beverages.    Visit Diagnosis:    ICD-10-CM   1. MDD (major depressive disorder), recurrent episode, mild (HCC)  F33.0 FLUoxetine (PROZAC) 20 MG capsule    2. Alcohol use disorder, mild, abuse  F10.10        Past Psychiatric History:  Diagnoses: depression dx at 57yo by therapist  Medication trials: trintellix effective but made her sick; zoloft started by pcp  Previous psychiatrist/therapist: Dr. Michae Kava for psychiatry; therapy with [facility] for 1 yr in 2020 Hospitalizations: Denies Suicide attempts: Denies SIB: Denies Hx of violence towards others: Denies Current access to guns: Denies Hx of trauma/abuse: Denies Substance use: Denies  Past Medical History:  Past Medical History:  Diagnosis Date   Depression    pt denies    Eczema    Major depression, recurrent (HCC) 04/06/2016   Vitamin D deficiency     Past Surgical History:  Procedure Laterality Date   chin tuck  03/2022   COLONOSCOPY  2007   normal   COLONOSCOPY WITH PROPOFOL  11/14/2020   Dr.Nandigam   LAPAROSCOPIC APPENDECTOMY N/A 02/20/2021   Procedure: APPENDECTOMY LAPAROSCOPIC;  Surgeon: Romie Levee, MD;  Location: [facility];  Service: General;  Laterality: N/A;   LASIK  2002   WISDOM TOOTH EXTRACTION      Family Psychiatric History:    Family History:  Family History  Problem Relation Age of Onset   Bipolar disorder Sister    Anxiety disorder Brother    Melanoma Mother 37   Diabetes Father    Obesity Father    Colon polyps Father 39   Colon cancer Neg Hx    Esophageal cancer Neg Hx    Stomach cancer Neg Hx    Rectal cancer Neg Hx     Social History:  Academic/Vocational:  Social History   Socioeconomic History   Marital status: Married    Spouse name: Not on file   Number of children: 4   Years of education: 16   Highest education level: Bachelor's degree (e.g., BA, AB, BS)  Occupational History   Not on file  Tobacco Use   Smoking status: Never   Smokeless tobacco: Never  Vaping Use   Vaping status: Never Used  Substance and Sexual Activity   Alcohol use: Not Currently    Comment: last 09/14/2021   Drug use: Never   Sexual activity: Yes    Partners: Male    Birth control/protection: Post-menopausal  Other Topics Concern   Not on file  Social History Narrative      Current living in [place] with husand and 2 kids   Born and raised in [place] by both parents   Siblings 6 total- pt is 3rd child- 4 boys and 2 girls   Married 17yrs      Legal issues- denies   Social Determinants of Corporate investment banker Strain: Not on file  Food Insecurity: Not on file  Transportation Needs: Not on file  Physical Activity: Not on file  Stress: Not on file  Social Connections: Not on file    Allergies: No Known Allergies  Current Medications: Current Outpatient Medications  Medication Sig Dispense Refill   cholecalciferol (VITAMIN D3) 25 MCG (1000 UNIT) tablet Take 1,000 Units by mouth daily.     BIOTIN PO Take 1 capsule by mouth daily.     FLUoxetine (PROZAC) 20 MG capsule Take 1 capsule (20 mg total) by mouth daily. 90 capsule 0   No current facility-administered medications for this visit.    ROS: Review of Systems  Constitutional:  Negative for activity change, appetite change and  unexpected weight change.  Gastrointestinal: Negative.   Musculoskeletal: Negative.   Neurological:  Negative for dizziness.     Objective:  Psychiatric Specialty Exam: There were no vitals taken for this visit.There is no height or weight on file to calculate BMI.  General Appearance: Well Groomed  Eye Contact:  Good  Speech:  Clear and Coherent and Normal Rate  Volume:  Normal  Mood:  Euthymic  Affect:  Appropriate and Congruent  Thought Content: WDL and Logical   Suicidal Thoughts:  No  Homicidal Thoughts:  No  Thought Process:  Coherent and Linear  Orientation:  Full (Time, Place, and Person)    Memory: Immediate;   Good Recent;   Good Remote;   Good  Judgment:  Good  Insight:  Good  Concentration:  Concentration: Good and Attention Span: Good  Recall: not formally assessed   Fund of Knowledge: Good  Language: Good  Psychomotor Activity:  Normal  Akathisia:  No  AIMS (if indicated): not done  Assets:  Communication Skills Desire for Improvement Housing Intimacy Leisure Time Physical Health Resilience Social Support Transportation Vocational/Educational  ADL's:  Intact  Cognition: WNL  Sleep:  Good   PE: General: well-appearing; no acute distress  Pulm: no increased work of breathing on room air  Strength & Muscle Tone: within normal limits Neuro: no focal neurological deficits observed  Gait & Station: normal  Metabolic Disorder Labs: Lab Results  Component Value Date   HGBA1C 5.3 05/13/2023   MPG 105 05/13/2023   MPG 114 10/22/2022   No results found for: "PROLACTIN" Lab Results  Component Value Date   CHOL 222 (H) 05/13/2023   TRIG 101 05/13/2023   HDL 96 05/13/2023   CHOLHDL 2.3 05/13/2023   VLDL 11 04/06/2016   LDLCALC 107 (H) 05/13/2023   LDLCALC 109 (H) 01/27/2023   Lab Results  Component Value Date   TSH 1.28 05/13/2023   TSH 3.88 10/22/2022    Therapeutic Level Labs: No results found for: "LITHIUM" No results found for:  "VALPROATE" No results found for: "CBMZ"  Screenings: PHQ2-9    Flowsheet Row Office Visit from 05/13/2023 in [facility] Office Visit from [facility]/26[facility]2023 in [facility] Video Visit from 02/18/2023 in [facility] Video Visit from 10/29/2022 in [facility] Office Visit from 10/22/2022 in [place] [facility]  PHQ-2 Total Score 0 0 1 0 0      Flowsheet Row Office Visit from 04/26/2023 in [facility] Video Visit from 02/18/2023 in [facility] Video Visit from 10/29/2022 in [facility]  C-SSRS RISK CATEGORY No Risk No Risk No Risk       Collaboration of Care: Collaboration of Care: Dr. Mercy Riding  Patient/Guardian was advised Release of Information must be obtained prior to any record release in order to collaborate their care with an outside provider. Patient/Guardian was advised if they have not already done so to contact the registration department to sign all necessary forms in order for [place] to release information regarding their care.   Consent: Patient/Guardian gives verbal consent for treatment and assignment of benefits for services provided during this visit. Patient/Guardian expressed understanding and agreed to proceed.   Lamar Sprinkles, MD 08/18/2023 11:46 PM

## 2023-08-20 NOTE — Addendum Note (Signed)
Addended by: Everlena Cooper on: 08/20/2023 10:24 AM   Modules accepted: Level of Service

## 2023-10-04 ENCOUNTER — Ambulatory Visit (HOSPITAL_COMMUNITY): Payer: BC Managed Care – PPO | Admitting: Student

## 2023-10-18 ENCOUNTER — Ambulatory Visit (HOSPITAL_COMMUNITY): Payer: BC Managed Care – PPO | Admitting: Student

## 2023-10-25 DIAGNOSIS — Z1331 Encounter for screening for depression: Secondary | ICD-10-CM | POA: Diagnosis not present

## 2023-10-25 DIAGNOSIS — Z01419 Encounter for gynecological examination (general) (routine) without abnormal findings: Secondary | ICD-10-CM | POA: Diagnosis not present

## 2023-10-25 DIAGNOSIS — Z1231 Encounter for screening mammogram for malignant neoplasm of breast: Secondary | ICD-10-CM | POA: Diagnosis not present

## 2023-10-25 LAB — HM MAMMOGRAPHY

## 2023-10-27 ENCOUNTER — Encounter: Payer: BC Managed Care – PPO | Admitting: Internal Medicine

## 2023-10-28 ENCOUNTER — Encounter: Payer: BC Managed Care – PPO | Admitting: Internal Medicine

## 2023-11-01 ENCOUNTER — Ambulatory Visit (HOSPITAL_COMMUNITY): Payer: BC Managed Care – PPO | Admitting: Student

## 2023-11-11 DIAGNOSIS — L821 Other seborrheic keratosis: Secondary | ICD-10-CM | POA: Diagnosis not present

## 2023-11-11 DIAGNOSIS — L814 Other melanin hyperpigmentation: Secondary | ICD-10-CM | POA: Diagnosis not present

## 2023-11-11 DIAGNOSIS — D229 Melanocytic nevi, unspecified: Secondary | ICD-10-CM | POA: Diagnosis not present

## 2023-11-11 DIAGNOSIS — Z808 Family history of malignant neoplasm of other organs or systems: Secondary | ICD-10-CM | POA: Diagnosis not present

## 2023-12-07 ENCOUNTER — Other Ambulatory Visit (HOSPITAL_COMMUNITY): Payer: Self-pay

## 2023-12-07 DIAGNOSIS — F330 Major depressive disorder, recurrent, mild: Secondary | ICD-10-CM

## 2023-12-07 MED ORDER — FLUOXETINE HCL 20 MG PO CAPS
20.0000 mg | ORAL_CAPSULE | Freq: Every day | ORAL | 0 refills | Status: DC
Start: 2023-12-07 — End: 2023-12-22

## 2023-12-22 ENCOUNTER — Ambulatory Visit (HOSPITAL_BASED_OUTPATIENT_CLINIC_OR_DEPARTMENT_OTHER): Admitting: Student

## 2023-12-22 DIAGNOSIS — F1020 Alcohol dependence, uncomplicated: Secondary | ICD-10-CM | POA: Diagnosis not present

## 2023-12-22 DIAGNOSIS — F3341 Major depressive disorder, recurrent, in partial remission: Secondary | ICD-10-CM

## 2023-12-22 DIAGNOSIS — F330 Major depressive disorder, recurrent, mild: Secondary | ICD-10-CM

## 2023-12-22 MED ORDER — FLUOXETINE HCL 20 MG PO CAPS: 20.0000 mg | ORAL_CAPSULE | Freq: Every day | ORAL | 2 refills | Status: AC

## 2023-12-22 NOTE — Patient Instructions (Signed)
 [facility]  URGENT CARE: Open 24 hours per day for acute and/or urgent behavioral health concerns.   OUTPATIENT Walk-in information:  Please note, all walk-ins are first come & first serve, with limited number of availability. Therapist for therapy:  Monday, Tuesday, Wednesday & Thursday mornings Please ARRIVE at 7:00 AM for registration Will START at 8:00 AM Every 1st, 2nd & 3rd Friday of the month: Please ARRIVE at 7:00 AM for registration Will START at 1 PM - 5 PM Psychiatrist for medication management: Monday - Friday:  Please ARRIVE at 7:00 AM for registration Will START at 8:00 AM Appointments times are as follows:  - New patients get 1 hr ex: 8-9 am 9-10 & 10-11 then that's it.  - Existing pt's that are not seeing their provider will still take 1hr as they would be new to the provider that is covering walk ins that day.  - Existing follow ups get 30 mins.... (if they have been seen by the provider covering walk ins that day.)       Regretfully, due to limited availability, please be aware that you may not been seen on the same day as walk-in. Please consider making an appoint or try again. Thank you for your patience and understanding.   Phone: [phone]. Ask about therapy with Remigio Eisenmenger, LCAS or [employer], LCSW.   [address] [place], [place] [address]

## 2023-12-22 NOTE — Progress Notes (Signed)
 [facility] MD Outpatient Progress Note  12/22/2023 8:37 AM  Jacqueline Banks  MRN:  [id number]  Assessment:  Jacqueline Banks presents for follow-up evaluation in-person. Today, 12/22/23, patient reports sustained improvements with Prozac. She reports good mood, sleeping well, and desire to care for herself.  Her alcohol consumption remains a problem, but she does minimize this, as she believes that she has uncoupled her previous patterns from her daily routine.  We again discuss therapy, but she advises that she plans to visit a hypnotist later this month. She declines therapy or medication assistance at this time. Since her mood is significantly better overall with sustained improvements with Prozac, patient inquires about discharge from services. This Clinical research associate is in agreement with returning care to PCP, with resources provided for substance use therapy and advising that she is able to return to the clinic as needed.    She provides no safety concerns and is not manic/hypomanic nor psychotic.   Identifying Information: Jacqueline Banks is a 58 y.o. female with a history of MDD who is an established patient with [facility] for management of medications, depression, and alcohol use.   Risk Assessment: An assessment of suicide and violence risk factors was performed as part of this evaluation and is not significantly changed from the last visit.             While future psychiatric events cannot be accurately predicted, the patient does not currently require acute inpatient psychiatric care and does not currently meet [place]  involuntary commitment criteria.          Plan:  # MDD Past medication trials:  Status of problem: Mild, Stable Interventions: -- Continue Prozac 20 mg daily  # Alcohol Use Disorder, mild Past medication trials:  Status of problem: Mild Interventions: -- Previously discontinued disulfiram 250 mg daily per patient preference   Return to care  on an as needed basis.  Patient was given contact information for behavioral health clinic and was instructed to call 911 for emergencies.    Patient and plan of care will be discussed with the Attending MD ,Dr. Sharalyn Dasen, who agrees with the above statement and plan.   Subjective:  Chief Complaint:  Chief Complaint  Patient presents with   Follow-up   Alcohol Problem   Medication Refill    Interval History: Things have been going well since last visit. She is responding well to Prozac, and feels that it helps to keep things calm for her. Her mood is good.   She is working out, eating well, engaged with family, and work is going well. She has mdade improvements with alcohol consumption.  She does not believe that she needs to continue to sollow with psychiatry.   Consumption: She does continue to drink, but no longer on a consistent basis. She plans to go to a hypnotist, as it helped her sister. Acknowledges that it is an unhealthy habit, but does not feel as angry with self. Always 2 large glasses, 3/4 of a bottle.   Did not do "dry January."   New PCP: Dr. Clearnce Curia at [facility]. Previous PCP recently passed away.    She is drinking only Thursday- Sunday; still consuming 3/4 of a bottle of wine. She reports drinking wine socially. We discuss Naltrexone and Acamprosate, but she declines. She believes alcohol to be more of a task, more embedded into her daily routine.   She denies SI, HI, AVH. Denies tobacco, illicit drugs.  Visit Diagnosis:    ICD-10-CM   1. MDD (major depressive disorder), recurrent, in partial remission (HCC)  F33.41 FLUoxetine (PROZAC) 20 MG capsule    2. Alcohol use disorder, moderate, dependence (HCC)  F10.20         Past Psychiatric History:  Diagnoses: depression dx at 58yo by therapist  Medication trials: trintellix effective but made her sick; zoloft started by pcp  Previous psychiatrist/therapist: Dr. Katrine Parody for psychiatry; therapy  with [facility] for 1 yr in 2020 Hospitalizations: Denies Suicide attempts: Denies SIB: Denies Hx of violence towards others: Denies Current access to guns: Denies Hx of trauma/abuse: Denies Substance use: Denies  Past Medical History:  Past Medical History:  Diagnosis Date   Depression    pt denies    Eczema    Major depression, recurrent (HCC) 04/06/2016   Vitamin D deficiency     Past Surgical History:  Procedure Laterality Date   chin tuck  03/2022   COLONOSCOPY  2007   normal   COLONOSCOPY WITH PROPOFOL  11/14/2020   Dr.Nandigam   LAPAROSCOPIC APPENDECTOMY N/A 02/20/2021   Procedure: APPENDECTOMY LAPAROSCOPIC;  Surgeon: Joyce Nixon, MD;  Location: [facility];  Service: General;  Laterality: N/A;   LASIK  2002   WISDOM TOOTH EXTRACTION      Family Psychiatric History:   Family History:  Family History  Problem Relation Age of Onset   Bipolar disorder Sister    Anxiety disorder Brother    Melanoma Mother 79   Diabetes Father    Obesity Father    Colon polyps Father 66   Colon cancer Neg Hx    Esophageal cancer Neg Hx    Stomach cancer Neg Hx    Rectal cancer Neg Hx     Social History:  Academic/Vocational:  Social History   Socioeconomic History   Marital status: Married    Spouse name: Not on file   Number of children: 4   Years of education: 16   Highest education level: Bachelor's degree (e.g., BA, AB, BS)  Occupational History   Not on file  Tobacco Use   Smoking status: Never   Smokeless tobacco: Never  Vaping Use   Vaping status: Never Used  Substance and Sexual Activity   Alcohol use: Not Currently    Comment: last 09/14/2021   Drug use: Never   Sexual activity: Yes    Partners: Male    Birth control/protection: Post-menopausal  Other Topics Concern   Not on file  Social History Narrative      Current living in [place] with husand and 2 kids   Born and raised in [place] by both parents   Siblings 6 total- pt is 3rd child- 4  boys and 2 girls   Married 72yrs      Legal issues- denies   Social Drivers of Corporate investment banker Strain: Not on file  Food Insecurity: Not on file  Transportation Needs: Not on file  Physical Activity: Not on file  Stress: Not on file  Social Connections: Not on file    Allergies: No Known Allergies  Current Medications: Current Outpatient Medications  Medication Sig Dispense Refill   BIOTIN PO Take 1 capsule by mouth daily.     cholecalciferol (VITAMIN D3) 25 MCG (1000 UNIT) tablet Take 1,000 Units by mouth daily.     FLUoxetine (PROZAC) 20 MG capsule Take 1 capsule (20 mg total) by mouth daily. 30 capsule 2   No current facility-administered medications for this  visit.    ROS: Review of Systems  Constitutional:  Negative for activity change, appetite change and unexpected weight change.  Gastrointestinal: Negative.   Musculoskeletal: Negative.   Neurological:  Negative for dizziness.     Objective:  Psychiatric Specialty Exam: There were no vitals taken for this visit.There is no height or weight on file to calculate BMI.  General Appearance: Well Groomed  Eye Contact:  Good  Speech:  Clear and Coherent and Normal Rate  Volume:  Normal  Mood:  Euthymic  Affect:  Appropriate and Congruent  Thought Content: WDL and Logical   Suicidal Thoughts:  No  Homicidal Thoughts:  No  Thought Process:  Coherent and Linear  Orientation:  Full (Time, Place, and Person)    Memory: Immediate;   Good Recent;   Good Remote;   Good  Judgment:  Poor  Insight:  Fair and Shallow  Concentration:  Concentration: Good and Attention Span: Good  Recall: not formally assessed   Fund of Knowledge: Good  Language: Good  Psychomotor Activity:  Normal  Akathisia:  No  AIMS (if indicated): not done  Assets:  Communication Skills Desire for Improvement Housing Intimacy Leisure Time Physical Health Resilience Social Support Transportation Vocational/Educational  ADL's:   Intact  Cognition: WNL  Sleep:  Good   PE: General: well-appearing; no acute distress  Pulm: no increased work of breathing on room air  Strength & Muscle Tone: within normal limits Neuro: no focal neurological deficits observed  Gait & Station: normal  Metabolic Disorder Labs: Lab Results  Component Value Date   HGBA1C 5.3 05/13/2023   MPG 105 05/13/2023   MPG 114 10/22/2022   No results found for: "PROLACTIN" Lab Results  Component Value Date   CHOL 222 (H) 05/13/2023   TRIG 101 05/13/2023   HDL 96 05/13/2023   CHOLHDL 2.3 05/13/2023   VLDL 11 04/06/2016   LDLCALC 107 (H) 05/13/2023   LDLCALC 109 (H) 01/27/2023   Lab Results  Component Value Date   TSH 1.28 05/13/2023   TSH 3.88 10/22/2022    Therapeutic Level Labs: No results found for: "LITHIUM" No results found for: "VALPROATE" No results found for: "CBMZ"  Screenings: PHQ2-9    Flowsheet Row Office Visit from 05/13/2023 in [place] [facility] Office Visit from 04/26/2023 in [facility] Video Visit from 02/18/2023 in [facility] Video Visit from 10/29/2022 in [facility] Office Visit from 10/22/2022 in [place] [facility]  PHQ-2 Total Score 0 0 1 0 0      Flowsheet Row Office Visit from 04/26/2023 in [facility] Video Visit from 02/18/2023 in [facility] Video Visit from 10[facility]29/2022 in [facility]  C-SSRS RISK CATEGORY No Risk No Risk No Risk       Collaboration of Care: Collaboration of Care: Dr. Sharalyn Dasen  Patient/Guardian was advised Release of Information must be obtained prior to any record release in order to collaborate their care with an outside provider. Patient/Guardian was advised if they have not already done so to  contact the registration department to sign all necessary forms in order for us  to release information regarding their care.   Consent: Patient/Guardian gives verbal consent for treatment and assignment of benefits for services provided during this visit. Patient/Guardian expressed understanding and agreed to proceed.   Shery Done, MD 12/22/2023 8:37 AM

## 2023-12-27 ENCOUNTER — Encounter (HOSPITAL_COMMUNITY): Payer: Self-pay | Admitting: Student

## 2023-12-27 NOTE — Addendum Note (Signed)
 Addended by: Donnelly Gainer on: 12/27/2023 08:25 AM   Modules accepted: Level of Service

## 2024-01-13 DIAGNOSIS — Z0000 Encounter for general adult medical examination without abnormal findings: Secondary | ICD-10-CM | POA: Diagnosis not present

## 2024-01-13 DIAGNOSIS — E785 Hyperlipidemia, unspecified: Secondary | ICD-10-CM | POA: Diagnosis not present

## 2024-01-13 DIAGNOSIS — R600 Localized edema: Secondary | ICD-10-CM | POA: Diagnosis not present

## 2024-01-13 DIAGNOSIS — Z13220 Encounter for screening for lipoid disorders: Secondary | ICD-10-CM | POA: Diagnosis not present

## 2024-02-28 DIAGNOSIS — L900 Lichen sclerosus et atrophicus: Secondary | ICD-10-CM | POA: Diagnosis not present

## 2024-02-28 DIAGNOSIS — N905 Atrophy of vulva: Secondary | ICD-10-CM | POA: Diagnosis not present

## 2024-02-28 DIAGNOSIS — R1031 Right lower quadrant pain: Secondary | ICD-10-CM | POA: Diagnosis not present

## 2024-02-28 DIAGNOSIS — N950 Postmenopausal bleeding: Secondary | ICD-10-CM | POA: Diagnosis not present

## 2024-03-14 DIAGNOSIS — R1031 Right lower quadrant pain: Secondary | ICD-10-CM | POA: Diagnosis not present

## 2024-07-25 DIAGNOSIS — L900 Lichen sclerosus et atrophicus: Secondary | ICD-10-CM | POA: Diagnosis not present

## 2024-07-25 DIAGNOSIS — N763 Subacute and chronic vulvitis: Secondary | ICD-10-CM | POA: Diagnosis not present

## 2024-07-25 DIAGNOSIS — N941 Unspecified dyspareunia: Secondary | ICD-10-CM | POA: Diagnosis not present

## 2024-07-25 DIAGNOSIS — N952 Postmenopausal atrophic vaginitis: Secondary | ICD-10-CM | POA: Diagnosis not present

## 2024-08-15 NOTE — Progress Notes (Unsigned)
 Chief Complaint: Discuss colonoscopy  HPI:    Jacqueline Banks is a 58 year old female with a past medical history as listed below including depression, known to Dr. Shila, who was referred to me by Ricky Pax T, DO for consideration of a colonoscopy.    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  Pathology showed benign lymphoid aggregates.  Repeat recommended in 3 years.   Past Medical History:  Diagnosis Date   Depression    pt denies    Eczema    Major depression, recurrent 04/06/2016   Vitamin D  deficiency     Past Surgical History:  Procedure Laterality Date   chin tuck  03/2022   COLONOSCOPY  2007   normal   COLONOSCOPY WITH PROPOFOL   11/14/2020   Dr.Nandigam   LAPAROSCOPIC APPENDECTOMY N/A 02/20/2021   Procedure: APPENDECTOMY LAPAROSCOPIC;  Surgeon: Debby Hila, MD;  Location: [facility];  Service: General;  Laterality: N/A;   LASIK  2002   WISDOM TOOTH EXTRACTION      Current Outpatient Medications  Medication Sig Dispense Refill   BIOTIN PO Take 1 capsule by mouth daily.     cholecalciferol (VITAMIN D3) 25 MCG (1000 UNIT) tablet Take 1,000 Units by mouth daily.     FLUoxetine  (PROZAC ) 20 MG capsule Take 1 capsule (20 mg total) by mouth daily. 30 capsule 2   No current facility-administered medications for this visit.    Allergies as of 08/16/2024   (No Known Allergies)    Family History  Problem Relation Age of Onset   Bipolar disorder Sister    Anxiety disorder Brother    Melanoma Mother 36   Diabetes Father    Obesity Father    Colon polyps Father 102   Colon cancer Neg Hx    Esophageal cancer Neg Hx    Stomach cancer Neg Hx    Rectal cancer Neg Hx     Social History   Socioeconomic History    Marital status: Married    Spouse name: Not on file   Number of children: 4   Years of education: 16   Highest education level: Bachelor's degree (e.g., BA, AB, BS)  Occupational History   Not on file  Tobacco Use   Smoking status: Never   Smokeless tobacco: Never  Vaping Use   Vaping status: Never Used  Substance and Sexual Activity   Alcohol use: Not Currently    Comment: last 09/14/2021   Drug use: Never   Sexual activity: Yes    Partners: Male    Birth control/protection: Post-menopausal  Other Topics Concern   Not on file  Social History Narrative      Current living in [place] with husand and 2 kids   Born and raised in [place] by both parents   Siblings 6 total- pt is 3rd child- 4 boys and 2 girls   Married 59yrs      Legal issues- denies   Social Drivers of Corporate Investment Banker Strain: Not on file  Food Insecurity: Not on file  Transportation Needs: Not on file  Physical Activity: Not on file  Stress: Not on file  Social Connections: Not on file  Intimate Partner Violence: Not on file    Review of Systems:    Constitutional:  No weight loss, fever, chills, weakness or fatigue HEENT: Eyes: No change in vision               Ears, Nose, Throat:  No change in hearing or congestion Skin: No rash or itching Cardiovascular: No chest pain, chest pressure or palpitations   Respiratory: No SOB or cough Gastrointestinal: See HPI and otherwise negative Genitourinary: No dysuria or change in urinary frequency Neurological: No headache, dizziness or syncope Musculoskeletal: No new muscle or joint pain Hematologic: No bleeding or bruising Psychiatric: No history of depression or anxiety    Physical Exam:  Vital signs: There were no vitals taken for this visit.  Constitutional:   Pleasant Caucasian female appears to be in NAD, Well developed, Well nourished, alert and cooperative Head:  Normocephalic and atraumatic. Eyes:   PEERL, EOMI. No icterus. Conjunctiva  pink. Ears:  Normal auditory acuity. Neck:  Supple Throat: Oral cavity and pharynx without inflammation, swelling or lesion.  Respiratory: Respirations even and unlabored. Lungs clear to auscultation bilaterally.   No wheezes, crackles, or rhonchi.  Cardiovascular: Normal S1, S2. No MRG. Regular rate and rhythm. No peripheral edema, cyanosis or pallor.  Gastrointestinal:  Soft, nondistended, nontender. No rebound or guarding. Normal bowel sounds. No appreciable masses or hepatomegaly. Rectal:  Not performed.  Msk:  Symmetrical without gross deformities. Without edema, no deformity or joint abnormality.  Neurologic:  Alert and  oriented x4;  grossly normal neurologically.  Skin:   Dry and intact without significant lesions or rashes. Psychiatric: Oriented to person, place and time. Demonstrates good judgement and reason without abnormal affect or behaviors.  RELEVANT LABS AND IMAGING: CBC    Component Value Date/Time   WBC 4.3 05/13/2023 1036   RBC 4.22 05/13/2023 1036   HGB 13.0 05/13/2023 1036   HCT 39.0 05/13/2023 1036   PLT 261 05/13/2023 1036   MCV 92.4 05/13/2023 1036   MCH 30.8 05/13/2023 1036   MCHC 33.3 05/13/2023 1036   RDW 11.9 05/13/2023 1036   LYMPHSABS 1,367 05/13/2023 1036   MONOABS 576 04/06/2016 1134   EOSABS 99 05/13/2023 1036   BASOSABS 60 05/13/2023 1036    CMP     Component Value Date/Time   NA 141 05/13/2023 1036   K 4.4 05/13/2023 1036   CL 104 05/13/2023 1036   CO2 27 05/13/2023 1036   GLUCOSE 91 05/13/2023 1036   BUN 17 05/13/2023 1036   CREATININE 0.76 05/13/2023 1036   CALCIUM  10.0 05/13/2023 1036   PROT 7.4 05/13/2023 1036   ALBUMIN 4.4 04/06/2016 1134   AST 22 05/13/2023 1036   ALT 13 05/13/2023 1036   ALKPHOS 62 04/06/2016 1134   BILITOT 0.5 05/13/2023 1036   GFRNONAA 89 10/21/2020 1055   GFRAA 103 10/21/2020 1055    Assessment: 1.  History of adenomatous polyps: On colonoscopy in 2022 with a large polyp removed piecemeal, repeat  05/29/2022 with no concern and repeat colonoscopy recommended in 3 years  Plan: 1. ***     Delon Failing, PA-C [facility] 08/15/2024, 10:48 AM  Cc: Ricky Pax T, DO

## 2024-08-16 ENCOUNTER — Ambulatory Visit: Admitting: Physician Assistant

## 2024-08-16 ENCOUNTER — Encounter: Payer: Self-pay | Admitting: Physician Assistant

## 2024-08-16 VITALS — BP 110/64 | HR 66 | Ht 68.0 in | Wt 150.2 lb

## 2024-08-16 DIAGNOSIS — Z860101 Personal history of adenomatous and serrated colon polyps: Secondary | ICD-10-CM | POA: Diagnosis not present

## 2024-08-16 DIAGNOSIS — K625 Hemorrhage of anus and rectum: Secondary | ICD-10-CM

## 2024-08-16 MED ORDER — NA SULFATE-K SULFATE-MG SULF 17.5-3.13-1.6 GM/177ML PO SOLN: 1.0000 | Freq: Once | ORAL | 0 refills | Status: AC

## 2024-08-16 NOTE — Patient Instructions (Addendum)
 Apply Hydrocortisone cream to hemorrhoids as needed.   You have been scheduled for a colonoscopy. Please follow written instructions given to you at your visit today.   If you use inhalers (even only as needed), please bring them with you on the day of your procedure.  DO NOT TAKE 7 DAYS PRIOR TO TEST- Trulicity (dulaglutide) Ozempic, Wegovy (semaglutide) Mounjaro, Zepbound (tirzepatide) Bydureon Bcise (exanatide extended release)  DO NOT TAKE 1 DAY PRIOR TO YOUR TEST Rybelsus (semaglutide) Adlyxin (lixisenatide) Victoza (liraglutide) Byetta (exanatide) ___________________________________________________________________________

## 2024-09-12 DIAGNOSIS — H2513 Age-related nuclear cataract, bilateral: Secondary | ICD-10-CM | POA: Diagnosis not present

## 2024-09-12 DIAGNOSIS — H04123 Dry eye syndrome of bilateral lacrimal glands: Secondary | ICD-10-CM | POA: Diagnosis not present

## 2024-09-12 DIAGNOSIS — H40013 Open angle with borderline findings, low risk, bilateral: Secondary | ICD-10-CM | POA: Diagnosis not present

## 2024-09-12 DIAGNOSIS — H35372 Puckering of macula, left eye: Secondary | ICD-10-CM | POA: Diagnosis not present

## 2024-10-13 ENCOUNTER — Encounter: Admitting: Gastroenterology

## 2024-11-03 ENCOUNTER — Encounter: Admitting: Gastroenterology
